# Patient Record
Sex: Female | Born: 1965 | Race: Black or African American | Hispanic: No | Marital: Single | Smoking: Current every day smoker
Health system: Southern US, Community
[De-identification: ages and names within clinical notes are randomized; demographics above are authoritative.]

## PROBLEM LIST (undated history)

## (undated) DIAGNOSIS — I5022 Chronic systolic (congestive) heart failure: Secondary | ICD-10-CM

## (undated) DIAGNOSIS — I428 Other cardiomyopathies: Secondary | ICD-10-CM

## (undated) DIAGNOSIS — Z720 Tobacco use: Secondary | ICD-10-CM

## (undated) DIAGNOSIS — I10 Essential (primary) hypertension: Secondary | ICD-10-CM

## (undated) DIAGNOSIS — I509 Heart failure, unspecified: Secondary | ICD-10-CM

## (undated) HISTORY — DX: Essential (primary) hypertension: I10

## (undated) HISTORY — DX: Heart failure, unspecified: I50.9

---

## 1998-08-17 HISTORY — PX: TUBAL LIGATION: SHX77

## 2011-07-29 ENCOUNTER — Ambulatory Visit (INDEPENDENT_AMBULATORY_CARE_PROVIDER_SITE_OTHER): Payer: 59

## 2011-07-29 DIAGNOSIS — R05 Cough: Secondary | ICD-10-CM

## 2011-07-29 DIAGNOSIS — R112 Nausea with vomiting, unspecified: Secondary | ICD-10-CM

## 2011-07-29 DIAGNOSIS — I10 Essential (primary) hypertension: Secondary | ICD-10-CM

## 2011-09-21 ENCOUNTER — Ambulatory Visit (INDEPENDENT_AMBULATORY_CARE_PROVIDER_SITE_OTHER): Payer: 59 | Admitting: Family Medicine

## 2011-09-21 ENCOUNTER — Ambulatory Visit: Payer: 59 | Admitting: Radiology

## 2011-09-21 VITALS — BP 152/88 | HR 79 | Temp 98.4°F | Resp 16 | Ht 61.25 in | Wt 155.2 lb

## 2011-09-21 DIAGNOSIS — I10 Essential (primary) hypertension: Secondary | ICD-10-CM | POA: Insufficient documentation

## 2011-09-21 DIAGNOSIS — M545 Low back pain: Secondary | ICD-10-CM

## 2011-09-21 DIAGNOSIS — M546 Pain in thoracic spine: Secondary | ICD-10-CM

## 2011-09-21 DIAGNOSIS — M549 Dorsalgia, unspecified: Secondary | ICD-10-CM

## 2011-09-21 DIAGNOSIS — R202 Paresthesia of skin: Secondary | ICD-10-CM

## 2011-09-21 DIAGNOSIS — M25529 Pain in unspecified elbow: Secondary | ICD-10-CM

## 2011-09-21 DIAGNOSIS — R209 Unspecified disturbances of skin sensation: Secondary | ICD-10-CM

## 2011-09-21 DIAGNOSIS — R200 Anesthesia of skin: Secondary | ICD-10-CM

## 2011-09-21 MED ORDER — MELOXICAM 7.5 MG PO TABS: 7.5000 mg | ORAL_TABLET | Freq: Two times a day (BID) | ORAL | Status: AC

## 2011-09-21 NOTE — Progress Notes (Signed)
Patient Name: Kim Underwood Date of Birth: 1965-11-25 Medical Record Number: [id number] Gender: female Date of Encounter: 09/21/2011  History of Present Illness:  Kim Underwood is a 46 y.o. very pleasant female patient who presents with the following:  Here with complaint of left pinky finger and left thumb numbness and also pain- had been present off and on for 2 weeks and then more constant over the last 3 days.   Has notes some difficulty with using her left hand- dropped a frying pan 2 nights ago.  Symptoms can vary without relation to time of day.  Also has pain up the ulnar side of her left arm.  No other numbness or weakness noted.  However does note lower back pain that can shoot up her back over the last 10 days.  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  Right handed.   There is no problem list on file for this patient.  No past medical history on file.  I have reviewed the patient's medical history in detail and updated the computerized patient record so it should appear there now.   No past surgical history on file. History  Substance Use Topics  . Smoking status: Current Everyday Smoker -- 0.5 packs/day    Types: Cigarettes  . Smokeless tobacco: Not on file  . Alcohol Use: Not on file   No family history on file. No Known Allergies  Medication list has been reviewed and updated.  Review of Systems As per HPI.  Kim Underwood does note that she was afraid "she was having a stroke" when this first started so she was afraid to come in and waited for the symptoms to go away. No unusual HA.  Physical Examination: Filed Vitals:   09/21/11 1009  BP: 152/88  Pulse: 79  Temp: 98.4 F (36.9 C)  TempSrc: Oral  Resp: 16  Height: 5' 1.25" (1.556 m)  Weight: 155 lb 3.2 oz (70.398 kg)    Body mass index is 29.09 kg/(m^2).  GEN: WDWN, NAD, Non-toxic, A & O x 3 HEENT: Atraumatic, Normocephalic. Neck supple. No masses, No  LAD. Ears and Nose: No external deformity. CV: RRR, No M/G/R. No extra heart sounds. PULM: CTA B, no wheezes, crackles, rhonchi. No retractions. No resp. distress. No accessory muscle use. ABD: S, NT, ND. EXTR: No c/c/e.  Tenderness at left wrist with flexion or extension especially along the ulnar aspect.  No swelling or redness  NEURO Normal gait.  Patient describes decreased sensation but also pain at her left pinky and thumb.  This is made worse by flexing or extending the wrist.  Strength is 4/5 but this is symetrical at all extremities.  Otherwise sensation is normal.  Normal biceps and petellar DTR bilateally Back: indicates tenderness at her lumbar spine with palpation, flexion and extension.  No swelling or redness or skin lesions.  Legs with normal sensation bilaterally, - straight leg raise. Gait is normal, normal cerebellar function.  PSYCH: Normally interactive. Conversant. Not depressed or anxious appearing.  Calm demeanor.   L spine 5v shows mild degenerative change, C spine 5v negative, left wrist negative  Assessment and Plan: 1. Numbness and tingling in hands  DG Cervical Spine Complete, DG Wrist Complete Left  2. Hypertension    3. Back pain of thoracolumbar region  DG Lumbar Spine Complete  Continue hypertension meds.  Called in Mobic to use prn for pain in back and wrist.  Advised her that  Mobic could increase her blood pressure so try to stick to just one pill if possible.   Likely nerve entrapment vs overuse causing left hand and wrist symptoms. Thumb spica splint for left wrist.  Offered an orthopedics referral but she prefers to wait and see how her wrist does over the next few days.  Note for work done.  Counseled patient re: signs of a stroke including painless numbness, facial drooping or weakness or difficulty speaking, and advised her that in case of these symptoms she should seek care right away, not wait for them to resolve on her own.  She will let me know if she is  not better in a few days- sooner if worse.

## 2011-09-21 NOTE — Patient Instructions (Signed)
Wear wrist splint and use Mobic as needed.  Patient (or parent if minor) instructed to return to clinic or call if not better in 2 day(s).

## 2011-09-23 ENCOUNTER — Telehealth: Payer: Self-pay | Admitting: Family Medicine

## 2011-09-24 ENCOUNTER — Telehealth: Payer: Self-pay | Admitting: Family Medicine

## 2011-09-24 NOTE — Telephone Encounter (Signed)
Created this encounter in error.   ?

## 2012-03-21 NOTE — Progress Notes (Signed)
This encounter was created in error - please disregard.

## 2012-10-07 ENCOUNTER — Other Ambulatory Visit: Payer: Self-pay | Admitting: Family Medicine

## 2012-12-12 ENCOUNTER — Ambulatory Visit (INDEPENDENT_AMBULATORY_CARE_PROVIDER_SITE_OTHER): Payer: BC Managed Care – PPO | Admitting: Family Medicine

## 2012-12-12 ENCOUNTER — Ambulatory Visit: Payer: BC Managed Care – PPO

## 2012-12-12 VITALS — BP 162/98 | HR 90 | Temp 98.0°F | Resp 16 | Ht 62.0 in | Wt 153.0 lb

## 2012-12-12 DIAGNOSIS — I10 Essential (primary) hypertension: Secondary | ICD-10-CM

## 2012-12-12 DIAGNOSIS — R0601 Orthopnea: Secondary | ICD-10-CM

## 2012-12-12 DIAGNOSIS — R05 Cough: Secondary | ICD-10-CM

## 2012-12-12 DIAGNOSIS — E876 Hypokalemia: Secondary | ICD-10-CM

## 2012-12-12 DIAGNOSIS — R059 Cough, unspecified: Secondary | ICD-10-CM

## 2012-12-12 LAB — POCT CBC
Hemoglobin: 13.1 g/dL (ref 12.2–16.2)
MPV: 6.9 fL (ref 0–99.8)
POC Granulocyte: 7.6 — AB (ref 2–6.9)
POC MID %: 8 %M (ref 0–12)
RBC: 4.5 M/uL (ref 4.04–5.48)

## 2012-12-12 MED ORDER — LISINOPRIL-HYDROCHLOROTHIAZIDE 10-12.5 MG PO TABS: 1.0000 | ORAL_TABLET | Freq: Every day | ORAL | Status: DC

## 2012-12-12 MED ORDER — CEFDINIR 300 MG PO CAPS: 300.0000 mg | ORAL_CAPSULE | Freq: Two times a day (BID) | ORAL | Status: DC

## 2012-12-12 NOTE — Patient Instructions (Signed)
Keep working on cutting down/ quitting smoking.  If you are having a hard time sleeping I would try benadryl or unisom first- these medications can be helpful but are not as strong as rx like ambien or lunesta.    I will let you know what the radiologist says about your chest x-ray.

## 2012-12-12 NOTE — Progress Notes (Addendum)
[facility] [address], [place] [address] [phone]  Date:  12/12/2012   Name:  Kim Underwood   DOB:  20-Aug-1965   MRN:  [id number]  PCP:  No primary provider on file.    Chief Complaint: Hypertension and Menopause   History of Present Illness:  Erick Oxendine is a 47 y.o. very pleasant female patient who presents with the following:  She would like to get back on her BP medication.  She has not used it in about one year- had let her care lapse due to financial difficulty.  She is not having any symptoms such as HA, but knows she needs to get back on her medications.  Her menses are spacing out- she had a cycle in October and then last month.  Her mother went through menopause at age 55.   She had most recently been on plain lisinopril 40 mg- she was changed from lisinopril/ hctz due to minimally low K.  However, she feelt better on the lisinopril/ hctz and would like to try this again if possible.    She notes that she sleeps poorly at night which she attributes to a cough and phlegm.  She has not tried any medication for this so far. She has not noted any fevers but has had some sweats- thinks this is due to menopause.  She is having some hot flashes.  This has been going on for nearly one year.  She has these every day.  However, she is able to tolerate her symptoms and does not wish to go on HRT She has had a BTL but not a hysterectomy.    Her BP has been running 140s/ 90- 100s when she checks at home   She is a current smoker- has been smoking for about 22 years. She is currently down to about 1/4 pack, but had smoked 1.5 PPD at her worst  She has been propping up on an extra pillow to sleep for about 6 weeks.  She does not have any pedal edema or SOB.   Patient Active Problem List   Diagnosis Date Noted  . Hypertension 09/21/2011    No past medical history on file.  Past Surgical History  Procedure Laterality Date  . Tubal ligation  2000     History  Substance Use Topics  . Smoking status: Current Every Day Smoker -- 0.50 packs/day    Types: Cigarettes  . Smokeless tobacco: Never Used  . Alcohol Use: Yes     Comment: very rarely    Family History  Problem Relation Age of Onset  . Stroke Mother   . Heart disease Mother   . Heart disease Father     No Known Allergies  Medication list has been reviewed and updated.  Current Outpatient Prescriptions on File Prior to Visit  Medication Sig Dispense Refill  . lisinopril (PRINIVIL,ZESTRIL) 40 MG tablet Take 40 mg by mouth daily.       No current facility-administered medications on file prior to visit.    Review of Systems:  As per HPI- otherwise negative.   Physical Examination: Filed Vitals:   12/12/12 1255  BP: 162/98  Pulse: 90  Temp: 98 F (36.7 C)  Resp: 16   Filed Vitals:   12/12/12 1255  Height: 5\' 2"  (1.575 m)  Weight: 153 lb (69.4 kg)   Body mass index is 27.98 kg/(m^2). Ideal Body Weight: Weight in (lb) to have BMI = 25: 136.4  GEN:  WDWN, NAD, Non-toxic, A & O x 3, overweight HEENT: Atraumatic, Normocephalic. Neck supple. No masses, No LAD. Ears and Nose: No external deformity. CV: RRR, No M/G/R. No JVD. No thrill. No extra heart sounds. PULM: CTA B, no wheezes, crackles, rhonchi. No retractions. No resp. distress. No accessory muscle use. EXTR: No c/c/e NEURO Normal gait.  PSYCH: Normally interactive. Conversant. Not depressed or anxious appearing.  Calm demeanor.   [facility] reading (PRIMARY) by  Dr. Patsy Lager. CXR: increased markings right lung.  Possible infiltrate.  Cardiomegaly CHEST - 2 VIEW  Comparison: None.  Findings: The cardiac silhouette is enlarged consistent with cardiomegaly and/or pericardial fluid. There is fluid in the fissures. There is interstitial and early alveolar edema. The findings are most consistent with congestive heart failure. The possibility of basilar pneumonia does exist. No significant  bony finding.  IMPRESSION: Enlarged cardiac silhouette consistent with cardiomegaly and/or pericardial fluid. Trace pleural fluid and interstitial and alveolar pulmonary density most consistent with congestive heart failure. Non consolidative infectious pneumonia at the lung bases is not excluded radiographically.   Results for orders placed in visit on 12/12/12  POCT CBC      Result Value Range   WBC 11.5 (*) 4.6 - 10.2 K/uL   Lymph, poc 3.0  0.6 - 3.4   POC LYMPH PERCENT 26.3  10 - 50 %L   MID (cbc) 0.9  0 - 0.9   POC MID % 8.0  0 - 12 %M   POC Granulocyte 7.6 (*) 2 - 6.9   Granulocyte percent 65.7  37 - 80 %G   RBC 4.50  4.04 - 5.48 M/uL   Hemoglobin 13.1  12.2 - 16.2 g/dL   HCT, POC 16.1  09.6 - 47.9 %   MCV 93.1  80 - 97 fL   MCH, POC 29.1  27 - 31.2 pg   MCHC 31.3 (*) 31.8 - 35.4 g/dL   RDW, POC 04.5     Platelet Count, POC 367  142 - 424 K/uL   MPV 6.9  0 - 99.8 fL    Assessment and Plan: HTN (hypertension) - Plan: POCT CBC, Comprehensive metabolic panel, lisinopril-hydrochlorothiazide (PRINZIDE,ZESTORETIC) 10-12.5 MG per tablet  Cough - Plan: DG Chest 2 View, cefdinir (OMNICEF) 300 MG capsule  Orthopnea - Plan: Brain natriuretic peptide  Kada is here today to restart her BP medication.  However, she also noted a cough for several weeks.  Her CXR is concerning for possible pneumonia and/ or CHF.  BNP is pending.  Electrolytes are pending.  She is not in any distress and her O2 sat is 100%.  Await BNP which should be back any time, and will treat accordingly.    Signed Abbe Amsterdam, MD  12/13/12 addned: called her to discuss likely CHF and her low potassium.  She works 2nd shift and generally goes to work between 2 and 4 pm.  Called in rx for plain lisinopril 10 mg as her K is already low. Will use this instead of the combo lisinopril/ hctz at least for now. May need to start Lasix, but would like to know more about her CHF status first.  Also K replacement 10  meq BID.  Order an echo for today.  Close follow- up planned.

## 2012-12-13 ENCOUNTER — Telehealth: Payer: Self-pay | Admitting: Radiology

## 2012-12-13 LAB — COMPREHENSIVE METABOLIC PANEL
ALT: 56 U/L — ABNORMAL HIGH (ref 0–35)
AST: 25 U/L (ref 0–37)
Albumin: 4 g/dL (ref 3.5–5.2)
Alkaline Phosphatase: 97 U/L (ref 39–117)
BUN: 11 mg/dL (ref 6–23)
CO2: 27 mEq/L (ref 19–32)
Calcium: 9.2 mg/dL (ref 8.4–10.5)
Chloride: 105 mEq/L (ref 96–112)
Creat: 0.72 mg/dL (ref 0.50–1.10)
Glucose, Bld: 91 mg/dL (ref 70–99)
Potassium: 3.1 mEq/L — ABNORMAL LOW (ref 3.5–5.3)
Sodium: 141 mEq/L (ref 135–145)
Total Bilirubin: 0.9 mg/dL (ref 0.3–1.2)
Total Protein: 7 g/dL (ref 6.0–8.3)

## 2012-12-13 LAB — BRAIN NATRIURETIC PEPTIDE: Brain Natriuretic Peptide: 313.9 pg/mL — ABNORMAL HIGH (ref 0.0–100.0)

## 2012-12-13 MED ORDER — POTASSIUM CHLORIDE CRYS ER 10 MEQ PO TBCR: 10.0000 meq | EXTENDED_RELEASE_TABLET | Freq: Two times a day (BID) | ORAL | Status: DC

## 2012-12-13 MED ORDER — LISINOPRIL 10 MG PO TABS: 10.0000 mg | ORAL_TABLET | Freq: Every day | ORAL | Status: DC

## 2012-12-13 NOTE — Telephone Encounter (Signed)
Message from Dr Patsy Lager

## 2012-12-13 NOTE — Telephone Encounter (Signed)
Can we please try to get an echo either today or tomorrow for her? She may have CHF and need to get more information. Thanks!

## 2012-12-13 NOTE — Telephone Encounter (Signed)
Kim Underwood is getting precert on this, so hopefully we can get today. I will let you know if we run into any snags.

## 2012-12-14 ENCOUNTER — Other Ambulatory Visit: Payer: Self-pay

## 2012-12-14 ENCOUNTER — Telehealth: Payer: Self-pay | Admitting: Radiology

## 2012-12-14 ENCOUNTER — Ambulatory Visit (HOSPITAL_COMMUNITY): Payer: BC Managed Care – PPO | Attending: Cardiovascular Disease | Admitting: Radiology

## 2012-12-14 ENCOUNTER — Ambulatory Visit (INDEPENDENT_AMBULATORY_CARE_PROVIDER_SITE_OTHER): Payer: BC Managed Care – PPO | Admitting: Family Medicine

## 2012-12-14 VITALS — BP 150/90

## 2012-12-14 DIAGNOSIS — R9389 Abnormal findings on diagnostic imaging of other specified body structures: Secondary | ICD-10-CM | POA: Insufficient documentation

## 2012-12-14 DIAGNOSIS — E876 Hypokalemia: Secondary | ICD-10-CM

## 2012-12-14 DIAGNOSIS — I10 Essential (primary) hypertension: Secondary | ICD-10-CM | POA: Insufficient documentation

## 2012-12-14 DIAGNOSIS — R0601 Orthopnea: Secondary | ICD-10-CM | POA: Insufficient documentation

## 2012-12-14 DIAGNOSIS — R079 Chest pain, unspecified: Secondary | ICD-10-CM

## 2012-12-14 DIAGNOSIS — I509 Heart failure, unspecified: Secondary | ICD-10-CM

## 2012-12-14 DIAGNOSIS — F17200 Nicotine dependence, unspecified, uncomplicated: Secondary | ICD-10-CM | POA: Insufficient documentation

## 2012-12-14 DIAGNOSIS — I5043 Acute on chronic combined systolic (congestive) and diastolic (congestive) heart failure: Secondary | ICD-10-CM | POA: Insufficient documentation

## 2012-12-14 MED ORDER — CARVEDILOL 3.125 MG PO TABS: 3.1250 mg | ORAL_TABLET | Freq: Two times a day (BID) | ORAL | Status: DC

## 2012-12-14 NOTE — Telephone Encounter (Signed)
Called Kim Underwood. To see if they can do this. They can not. They will have her come here after the Echo and have EKG done here. To you FYI

## 2012-12-14 NOTE — Progress Notes (Signed)
Echocardiogram performed.  

## 2012-12-14 NOTE — Progress Notes (Signed)
Here today just for an EKG.  She had an echo earlier today as we are looking into possible CHF.  She notes that her breathing feels "100% better" since starting the abx (and also restarting lisinopril)  2 days ago.  She has no peripheral edema and is no longer needed to sleep on a 2nd pillow.    Await her echo report and will give her a call.    EKG: sinus rhythm, downgoing T waves in V4/5/6.  No ST elevation or depression  Received echo report: Study Conclusions  - Left ventricle: The cavity size was mildly dilated. Wall thickness was normal. Systolic function was severely reduced. The estimated ejection fraction was in the range of 15% to 20%. Diffuse hypokinesis. Doppler parameters are consistent with an irreversible restrictive pattern, indicative of decreased left ventricular diastolic compliance and/or increased left atrial pressure (grade 4 diastolic dysfunction). - Mitral valve: Moderate regurgitation. - Left atrium: The atrium was mildly dilated. - Pulmonary arteries: PA peak pressure: 35mm Hg (S). - Pericardium, extracardiac: A trivial pericardial effusion was identified posterior to the heart.  Spoke with Dr. Graciela Husbands- we will plan to have her seen asap. In the meantime will start coreg 3.125 BID.  Continue to replete her K.  Will place an order for a K level and have her come for a blood draw only on Friday.  Spoke with patient and alerted her to plan.  Referral to cardiology asap.

## 2012-12-14 NOTE — Telephone Encounter (Signed)
Patient scheduled for Echo today at 10:30, need EKG also. Will call and see if this can be done at same time.

## 2012-12-15 ENCOUNTER — Telehealth: Payer: Self-pay | Admitting: Family Medicine

## 2012-12-15 DIAGNOSIS — I509 Heart failure, unspecified: Secondary | ICD-10-CM

## 2012-12-15 NOTE — Telephone Encounter (Signed)
Called Kim Underwood- I was able to get an appt with cardiology next Wednesday 12/21/12 at 11:15.  She is aware of appt. She will also come in tomorrow for a K recheck/ urine HCG and I will check her BP.

## 2012-12-16 ENCOUNTER — Other Ambulatory Visit (INDEPENDENT_AMBULATORY_CARE_PROVIDER_SITE_OTHER): Payer: BC Managed Care – PPO | Admitting: Family Medicine

## 2012-12-16 DIAGNOSIS — I509 Heart failure, unspecified: Secondary | ICD-10-CM

## 2012-12-16 DIAGNOSIS — E876 Hypokalemia: Secondary | ICD-10-CM

## 2012-12-16 LAB — POTASSIUM: Potassium: 4.2 mEq/L (ref 3.5–5.3)

## 2012-12-16 NOTE — Progress Notes (Addendum)
Patient here for lab only She is feeling well, maybe feels a little bit tired since she started the coreg.  Breathing well  Understands that she is seeing cardiology next week.   Will follow- up with K results later today  Results for orders placed in visit on 12/16/12  POTASSIUM      Result Value Range   Potassium 4.2  3.5 - 5.3 mEq/L  POCT URINE PREGNANCY      Result Value Range   Preg Test, Ur Negative     Received her K level- now normal.  Her lisinopril may also be helping to correct her K.  At this time she can d/c the K supplement.  She will ask the cardiologist if they can recheck her K at her visit next week.  If they are not able to do this she will let me know and I will order it.

## 2012-12-19 ENCOUNTER — Encounter: Payer: Self-pay | Admitting: Family Medicine

## 2012-12-19 NOTE — Addendum Note (Signed)
Addended by: Abbe Amsterdam C on: 12/19/2012 09:05 PM   Modules accepted: Orders

## 2012-12-21 ENCOUNTER — Encounter: Payer: Self-pay | Admitting: Cardiovascular Disease

## 2012-12-21 ENCOUNTER — Ambulatory Visit (INDEPENDENT_AMBULATORY_CARE_PROVIDER_SITE_OTHER): Payer: BC Managed Care – PPO | Admitting: Cardiovascular Disease

## 2012-12-21 VITALS — BP 130/94 | HR 82 | Ht 60.0 in | Wt 157.0 lb

## 2012-12-21 DIAGNOSIS — I5022 Chronic systolic (congestive) heart failure: Secondary | ICD-10-CM

## 2012-12-21 MED ORDER — SPIRONOLACTONE 25 MG PO TABS: 12.5000 mg | ORAL_TABLET | Freq: Every day | ORAL | Status: DC

## 2012-12-21 NOTE — Progress Notes (Signed)
HPI:  47 year-old woman presenting for initial cardiac evaluation. She has recently been diagnosed with a severe cardiomyopathy after presenting with symptoms of orthopnea and PND. BNP and chest XRay were consistent with CHF. An echo showed severe LV systolic and diastolic dysfunction with an LVEF of less than 20%.   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 She is able to do physical work without breathlessness. She complains of feeling 'slowed down' since being started on carvedilol. Her nocturnal cough has resolved. She denies any further orthopnea, but admits to PND on occasion. No chest pain, pressure, palpitations, lightheadedness, or syncope.  She has no past history of cardiac disease. She was diagnosed with HTN several years ago but has not been consistently treated because of limited resources. She has not been hospitalized, has no history of CAD or MI, and does not drink alcohol regularly. She's has no recent illness or systemic symptoms. Specifically denies joint pains, myalgias, fever, chills, weight loss. She does admit to 'hot flashes' but attributes this to menopause.   Outpatient Encounter Prescriptions as of 12/21/2012  Medication Sig Dispense Refill  . carvedilol (COREG) 3.125 MG tablet Take 1 tablet (3.125 mg total) by mouth 2 (two) times daily with a meal.  60 tablet  3  . cefdinir (OMNICEF) 300 MG capsule Take 1 capsule (300 mg total) by mouth 2 (two) times daily.  20 capsule  0  . lisinopril (PRINIVIL,ZESTRIL) 10 MG tablet Take 1 tablet (10 mg total) by mouth daily.  30 tablet  3  . spironolactone (ALDACTONE) 25 MG tablet Take 0.5 tablets (12.5 mg total) by mouth daily.  90 tablet  3  . [DISCONTINUED] potassium chloride (K-DUR,KLOR-CON) 10 MEQ tablet Take 1 tablet (10 mEq total) by mouth 2 (two) times daily.  60 tablet  1   No facility-administered encounter medications on file as of 12/21/2012.    Review of patient's allergies indicates no known  allergies.  Past Medical History  Diagnosis Date  . Hypertension, malignant   . Acute on chronic combined systolic and diastolic heart failure     Past Surgical History  Procedure Laterality Date  . Tubal ligation  2000    History   Social History  . Marital Status: Single    Spouse Name: N/A    Number of Children: N/A  . Years of Education: N/A   Occupational History  . Not on file.   Social History Main Topics  . Smoking status: Current Every Day Smoker -- 0.50 packs/day    Types: Cigarettes  . Smokeless tobacco: Never Used  . Alcohol Use: Yes     Comment: very rarely  . Drug Use: No  . Sexually Active: Not on file   Other Topics Concern  . Not on file   Social History Narrative   Lives in [place]. Smokes cigarettes 1/2 ppd, social Etoh but not to excess. Works at [employer].     Family History  Problem Relation Age of Onset  . Stroke Mother   . Heart disease Mother   . Heart disease Father    Family history: Father had CHF in his 19's, Mother had CAD and stents in her 74's  ROS:  General: no fevers/chills/night sweats Eyes: no blurry vision, diplopia, or amaurosis ENT: no sore throat or hearing loss Resp: see HPI CV: see HPI GI: no abdominal pain, nausea, vomiting, diarrhea, or constipation GU: no dysuria, frequency, or hematuria Skin: no rash Neuro: no  headache, numbness, tingling, or weakness of extremities Musculoskeletal: no joint pain or swelling Heme: no bleeding, DVT, or easy bruising Endo: no polydipsia or polyuria  BP 130/94  Pulse 82  Ht 5' (1.524 m)  Wt 71.215 kg (157 lb)  BMI 30.66 kg/m2  LMP 11/16/2012  PHYSICAL EXAM: Pt is alert and oriented, WD, WN, in no distress, pleasant African-American woman. HEENT: normal Neck: JVP normal. Carotid upstrokes normal without bruits. No thyromegaly. Lungs: equal expansion, clear bilaterally CV: Apex is discrete and nondisplaced, RRR without murmur or gallop Abd: soft, NT,  +BS, no bruit, no hepatosplenomegaly, obese Back: no CVA tenderness Ext: no C/C/E, thin        DP/PT pulses intact and = Skin: warm and dry without rash Neuro: CNII-XII intact             Strength intact = bilaterally  EKG 12/14/12: Sinus rhythm with biatrial enlargement, nonspecific ST-T wave abnormality  CXR 12/12/12: Findings: The cardiac silhouette is enlarged consistent with  cardiomegaly and/or pericardial fluid. There is fluid in the  fissures. There is interstitial and early alveolar edema. The  findings are most consistent with congestive heart failure. The  possibility of basilar pneumonia does exist. No significant bony  finding.  IMPRESSION:  Enlarged cardiac silhouette consistent with cardiomegaly and/or  pericardial fluid. Trace pleural fluid and interstitial and  alveolar pulmonary density most consistent with congestive heart  failure. Non consolidative infectious pneumonia at the lung bases  is not excluded radiographically.   2D Echo (12/14/12): Left ventricle: The cavity size was mildly dilated. Wall thickness was normal. Systolic function was severely reduced. The estimated ejection fraction was in the range of 15% to 20%. Diffuse hypokinesis. Doppler parameters are consistent with an irreversible restrictive pattern, indicative of decreased left ventricular diastolic compliance and/or increased left atrial pressure (grade 4 diastolic dysfunction).  ------------------------------------------------------------ Aortic valve: Structurally normal valve. Trileaflet. Cusp separation was normal. Doppler: Transvalvular velocity was within the normal range. There was no stenosis. No regurgitation.  ------------------------------------------------------------ Aorta: The aorta was normal, not dilated, and non-diseased.  ------------------------------------------------------------ Mitral valve: Structurally normal valve. Leaflet separation was normal. Doppler:  Transvalvular velocity was within the normal range. There was no evidence for stenosis. Moderate regurgitation. Peak gradient: 5mm Hg (D).  ------------------------------------------------------------ Left atrium: The atrium was mildly dilated.  ------------------------------------------------------------ Right ventricle: The cavity size was normal. Wall thickness was normal. Systolic function was normal.  ------------------------------------------------------------ Pulmonic valve: Structurally normal valve. Cusp separation was normal. Doppler: Transvalvular velocity was within the normal range. Trivial regurgitation.  ------------------------------------------------------------ Tricuspid valve: Structurally normal valve. Leaflet separation was normal. Doppler: Transvalvular velocity was within the normal range. Mild regurgitation.  ------------------------------------------------------------ Pulmonary artery: The main pulmonary artery was dilated. Systolic pressure was at the upper limits of normal.  ------------------------------------------------------------ Right atrium: The atrium was at the upper limits of normal in size.  ------------------------------------------------------------ Pericardium: A trivial pericardial effusion was identified posterior to the heart.  ASSESSMENT AND PLAN: 1. 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 Despite limited symptoms, her echo suggests advanced disease. We discussed implications of CHF diagnosis and issues related to medical therapy, diet, and close follow-up. We reviewed potential etiologies of severe LV dysfunction, including hypertension, CAD, infiltrative disorders. I have ordered a cardiac MRI with and without gadolinium contrast to further evaluate potential etiologies. Regarding medical therapy, we have added aldactone 12.5 mg daily. She wants to stop carvedilol but she was advised to stick with it at low dose and hopefully her fatigue will  improve. She understands  the importance of a beta-blocker in the setting of CHF with severe LV dysfunction. I think she will need a right and left heart catheterization and we discussed this today. I am going to refer her to the [facility] and will defer heart catheterization to Dr Gala Romney.  2. HTN, malignant with suboptimal control. Continue carvedilol and lisinopril. Add aldactone. Follow-up BMET in 2 weeks.  Echo, labwork, and XRay studies were reviewed. Will review cardiac MRI when available and anticipate heart catheterization as above.  Tonny Bollman 12/21/2012 2:14 PM

## 2012-12-21 NOTE — Patient Instructions (Addendum)
Your physician has recommended you make the following change in your medication: START Aldactone 25mg  take one-half tablet by mouth daily  Your physician has requested that you have a cardiac MRI with and without contrast. Cardiac MRI uses a computer to create images of your heart as its beating, producing both still and moving pictures of your heart and major blood vessels. For further information please visit [email or web address]. Please follow the instruction sheet given to you today for more information.  Your physician recommends that you return for lab work in: 2 WEEKS (BMP)  Your physician recommends that you schedule a follow-up appointment in: 3-4 WEEKS with CHF clinic (appointment should be after the pt has had cardiac MRI performed)

## 2012-12-28 ENCOUNTER — Telehealth: Payer: Self-pay

## 2012-12-28 ENCOUNTER — Telehealth: Payer: Self-pay | Admitting: Cardiovascular Disease

## 2012-12-28 NOTE — Telephone Encounter (Signed)
This was ordered through [facility].

## 2012-12-28 NOTE — Telephone Encounter (Signed)
New Prob      Pt has questions regarding cardiac MRI, would like to speak to nurse.

## 2012-12-28 NOTE — Telephone Encounter (Signed)
Left pt a message to call back. 

## 2012-12-28 NOTE — Telephone Encounter (Signed)
Dr.Copland, Pt would like to speak with you regarding her MRI not being scheduled yet. Best# [phone]

## 2012-12-28 NOTE — Telephone Encounter (Signed)
Called [facility] regarding her MRI.  The triage pool will address

## 2012-12-29 NOTE — Telephone Encounter (Signed)
Pt needs to be scheduled for Cardiac MRI  Order in EPIC.  Pt is waiting for a call.

## 2013-01-02 ENCOUNTER — Other Ambulatory Visit (INDEPENDENT_AMBULATORY_CARE_PROVIDER_SITE_OTHER): Payer: BC Managed Care – PPO

## 2013-01-02 ENCOUNTER — Other Ambulatory Visit: Payer: Self-pay | Admitting: *Deleted

## 2013-01-02 DIAGNOSIS — E876 Hypokalemia: Secondary | ICD-10-CM

## 2013-01-02 DIAGNOSIS — I5022 Chronic systolic (congestive) heart failure: Secondary | ICD-10-CM

## 2013-01-02 LAB — BASIC METABOLIC PANEL
BUN: 10 mg/dL (ref 6–23)
Calcium: 8.5 mg/dL (ref 8.4–10.5)
GFR: 99.56 mL/min (ref >60.00)
Glucose, Bld: 114 mg/dL — ABNORMAL HIGH (ref 70–99)
Sodium: 139 mEq/L (ref 135–145)

## 2013-01-10 ENCOUNTER — Telehealth: Payer: Self-pay | Admitting: *Deleted

## 2013-01-10 ENCOUNTER — Other Ambulatory Visit (INDEPENDENT_AMBULATORY_CARE_PROVIDER_SITE_OTHER): Payer: BC Managed Care – PPO

## 2013-01-10 DIAGNOSIS — I5043 Acute on chronic combined systolic (congestive) and diastolic (congestive) heart failure: Secondary | ICD-10-CM

## 2013-01-10 DIAGNOSIS — E876 Hypokalemia: Secondary | ICD-10-CM

## 2013-01-10 LAB — BASIC METABOLIC PANEL
CO2: 24 mEq/L (ref 19–32)
Calcium: 8.6 mg/dL (ref 8.4–10.5)
Chloride: 105 mEq/L (ref 96–112)
Sodium: 137 mEq/L (ref 135–145)

## 2013-01-10 MED ORDER — POTASSIUM CHLORIDE ER 10 MEQ PO TBCR: 20.0000 meq | EXTENDED_RELEASE_TABLET | Freq: Two times a day (BID) | ORAL | Status: DC

## 2013-01-10 NOTE — Telephone Encounter (Signed)
Message copied by Tarri Fuller on Tue Jan 10, 2013  5:42 PM ------      Message from: [place], [place] T      Created: Tue Jan 10, 2013  5:34 PM       Add K+ 20 mEq QD      Check BMET in 1 week      Tereso Newcomer, PA-C        01/10/2013 5:33 PM ------

## 2013-01-10 NOTE — Telephone Encounter (Signed)
pt notified about lab results and to start K+; pt had been on K+ 10 bid recently but states PCP d/c. pt understands per recommendations from PA to restart K+ 20 meq bid, bmet 01/16/13, pt said ok and thank you.

## 2013-01-10 NOTE — Progress Notes (Signed)
Quick Note:  Preliminary report reviewed by triage nurse and sent to MD desk. ______ 

## 2013-01-16 ENCOUNTER — Institutional Professional Consult (permissible substitution): Payer: BC Managed Care – PPO | Admitting: Cardiology

## 2013-01-16 ENCOUNTER — Other Ambulatory Visit (INDEPENDENT_AMBULATORY_CARE_PROVIDER_SITE_OTHER): Payer: BC Managed Care – PPO

## 2013-01-16 DIAGNOSIS — I5043 Acute on chronic combined systolic (congestive) and diastolic (congestive) heart failure: Secondary | ICD-10-CM

## 2013-01-16 LAB — BASIC METABOLIC PANEL
BUN: 12 mg/dL (ref 6–23)
Chloride: 109 mEq/L (ref 96–112)
Potassium: 3.8 mEq/L (ref 3.5–5.1)
Sodium: 138 mEq/L (ref 135–145)

## 2013-01-17 ENCOUNTER — Telehealth: Payer: Self-pay | Admitting: *Deleted

## 2013-01-17 NOTE — Telephone Encounter (Signed)
pt notified about lab results with verbal understanding today.  

## 2013-01-17 NOTE — Telephone Encounter (Signed)
Message copied by Tarri Fuller on Tue Jan 17, 2013 10:00 AM ------      Message from: [place], [place] T      Created: Mon Jan 16, 2013  9:03 PM       Potassium and kidney function look good.      Continue with current treatment plan.      Tereso Newcomer, PA-C  4:49 PM 06/30/2012 ------

## 2013-01-30 ENCOUNTER — Encounter (HOSPITAL_COMMUNITY): Payer: BC Managed Care – PPO

## 2013-02-09 ENCOUNTER — Ambulatory Visit (HOSPITAL_COMMUNITY): Payer: BC Managed Care – PPO

## 2013-02-13 ENCOUNTER — Encounter: Payer: Self-pay | Admitting: Cardiology

## 2013-02-13 ENCOUNTER — Ambulatory Visit (HOSPITAL_COMMUNITY)
Admission: RE | Admit: 2013-02-13 | Discharge: 2013-02-13 | Disposition: A | Discharge status: Home / Self Care | Payer: BC Managed Care – PPO | Source: Ambulatory Visit | Attending: Internal Medicine | Admitting: Internal Medicine

## 2013-02-13 ENCOUNTER — Encounter (HOSPITAL_COMMUNITY): Payer: Self-pay | Admitting: *Deleted

## 2013-02-13 ENCOUNTER — Encounter (HOSPITAL_COMMUNITY): Payer: Self-pay

## 2013-02-13 ENCOUNTER — Ambulatory Visit (INDEPENDENT_AMBULATORY_CARE_PROVIDER_SITE_OTHER): Payer: BC Managed Care – PPO | Admitting: Cardiology

## 2013-02-13 ENCOUNTER — Other Ambulatory Visit (HOSPITAL_COMMUNITY): Payer: Self-pay | Admitting: *Deleted

## 2013-02-13 VITALS — BP 120/82 | HR 80 | Wt 148.0 lb

## 2013-02-13 DIAGNOSIS — I10 Essential (primary) hypertension: Secondary | ICD-10-CM

## 2013-02-13 DIAGNOSIS — I5041 Acute combined systolic (congestive) and diastolic (congestive) heart failure: Secondary | ICD-10-CM | POA: Insufficient documentation

## 2013-02-13 DIAGNOSIS — I5043 Acute on chronic combined systolic (congestive) and diastolic (congestive) heart failure: Secondary | ICD-10-CM

## 2013-02-13 DIAGNOSIS — I5022 Chronic systolic (congestive) heart failure: Secondary | ICD-10-CM | POA: Insufficient documentation

## 2013-02-13 DIAGNOSIS — I509 Heart failure, unspecified: Secondary | ICD-10-CM

## 2013-02-13 LAB — BASIC METABOLIC PANEL
CO2: 23 mEq/L (ref 19–32)
Calcium: 9 mg/dL (ref 8.4–10.5)
Chloride: 104 mEq/L (ref 96–112)
Glucose, Bld: 99 mg/dL (ref 70–99)
Sodium: 138 mEq/L (ref 135–145)

## 2013-02-13 LAB — TSH: TSH: 0.144 u[IU]/mL — ABNORMAL LOW (ref 0.350–4.500)

## 2013-02-13 MED ORDER — LISINOPRIL 10 MG PO TABS: 20.0000 mg | ORAL_TABLET | Freq: Every day | ORAL | Status: DC

## 2013-02-13 MED ORDER — CARVEDILOL 3.125 MG PO TABS: 3.1250 mg | ORAL_TABLET | Freq: Two times a day (BID) | ORAL | Status: DC

## 2013-02-13 MED ORDER — LISINOPRIL 10 MG PO TABS: 10.0000 mg | ORAL_TABLET | Freq: Two times a day (BID) | ORAL | Status: DC

## 2013-02-13 MED ORDER — SPIRONOLACTONE 25 MG PO TABS: 12.5000 mg | ORAL_TABLET | Freq: Every day | ORAL | Status: DC

## 2013-02-13 MED ORDER — POTASSIUM CHLORIDE ER 10 MEQ PO TBCR: 20.0000 meq | EXTENDED_RELEASE_TABLET | Freq: Two times a day (BID) | ORAL | Status: DC

## 2013-02-13 NOTE — Patient Instructions (Addendum)
Your physician has requested that you have a cardiac catheterization. Cardiac catheterization is used to diagnose and/or treat various heart conditions. Doctors may recommend this procedure for a number of different reasons. The most common reason is to evaluate chest pain. Chest pain can be a symptom of coronary artery disease (CAD), and cardiac catheterization can show whether plaque is narrowing or blocking your heart's arteries. This procedure is also used to evaluate the valves, as well as measure the blood flow and oxygen levels in different parts of your heart. For further information please visit [email or web address] Please follow instruction sheet, as given.  Your physician recommends that you schedule a follow-up appointment in: 1 month

## 2013-02-13 NOTE — Progress Notes (Signed)
Patient ID: Kim Underwood, female DOB: 09/08/1965, 47 y.o. MRN: [id number]  Weight Range    Baseline proBNP     PCP: Dr Copland   HPI:  She is referred to the HF clinic by Dr Cooper due to new diagnosis of HF.   Kim Underwood is a 47 year with a history of severe cardiomyopathy, HTN, and current smoker. She stopped taking her BP medications a year ago because she did not have insurance. She has not history of coronary disease. Never had a heart cath. Both her parents had heart failure. Mom had stents.   ECHO 12/14/12 EF 15-20% grade IV diastolic dysfunction.   Initially evaluated by Dr Cooper 12/21/12 . Plan for cardiac MRI but this was denied by insurance. She was started on spironolactone 12.5 mg daily.   She returns for follow up. Chronic cough. Denies SOB/PND/Orthopnea. No chest pain. Compliant with medications. 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   ROS: All systems negative except as listed in HPI, PMH and Problem List.   Past Medical History   Diagnosis  Date   .  Hypertension, malignant    .  Acute on chronic combined systolic and diastolic heart failure     Current Outpatient Prescriptions   Medication  Sig  Dispense  Refill   .  carvedilol (COREG) 3.125 MG tablet  Take 1 tablet (3.125 mg total) by mouth 2 (two) times daily with a meal.  60 tablet  3   .  lisinopril (PRINIVIL,ZESTRIL) 10 MG tablet  Take 1 tablet (10 mg total) by mouth daily.  30 tablet  3   .  potassium chloride (K-DUR) 10 MEQ tablet  Take 2 tablets (20 mEq total) by mouth 2 (two) times daily.     .  spironolactone (ALDACTONE) 25 MG tablet  Take 0.5 tablets (12.5 mg total) by mouth daily.  90 tablet  3    No current facility-administered medications for this encounter.    PHYSICAL EXAM:  Filed Vitals:    02/13/13 1203   BP:  120/82   Pulse:  80   Weight:  148 lb (67.132 kg)   SpO2:  100%   General: Well appearing. No resp difficulty  HEENT: normal  Neck: supple. JVP flat. Carotids 2+ bilaterally;  no bruits. No lymphadenopathy or thryomegaly appreciated.  Cor: PMI normal. Regular rate & rhythm. No rubs, gallops or murmurs.  Lungs: clear  Abdomen: soft, nontender, nondistended. No hepatosplenomegaly. No bruits or masses. Good bowel sounds.  Extremities: no cyanosis, clubbing, rash, edema  Neuro: alert & orientedx3, cranial nerves grossly intact. Moves all 4 extremities w/o difficulty. Affect pleasant.   ASSESSMENT & PLAN: 1. HTN: BP controlled on current regimen.  2. Smoking: Patient strongly encouraged to stop smoking.  3. Chronic systolic CHF: EF 15%, diffuse hypokinesis on echo.  Cause of cardiomyopathy uncertain.  Will need to rule out CAD.  Will also need to workup for other secondary causes of cardiomyopathy.  Of note, both parents had CHF, but appears that mother also had CAD.  QRS is not wide on ECG.  No findings suggestive of sarcoidosis on CXR.  - Will plan RHC/LHC to assess CI, filling pressures, and to look for CAD.  - Send labs for TSH, BNP, Fe studies, serum/urine immunofixation, ANA.  - Will try again to get approval for cMRI if no CAD on cath.  - continue current Coreg and spironolactone. - Increase lisinopril to 20 mg daily with BMET in 1 week.     Joeseph Underwood 02/13/2013 1:00 PM  

## 2013-02-14 ENCOUNTER — Other Ambulatory Visit (HOSPITAL_COMMUNITY): Payer: Self-pay | Admitting: Adult Health

## 2013-02-14 DIAGNOSIS — I5022 Chronic systolic (congestive) heart failure: Secondary | ICD-10-CM

## 2013-02-15 ENCOUNTER — Ambulatory Visit (HOSPITAL_COMMUNITY): Payer: BC Managed Care – PPO

## 2013-02-15 LAB — PROTEIN ELECTROPHORESIS, SERUM
Albumin ELP: 56.1 % (ref 55.8–66.1)
Alpha-1-Globulin: 4.2 % (ref 2.9–4.9)
Alpha-2-Globulin: 11 % (ref 7.1–11.8)
Beta Globulin: 7 % (ref 4.7–7.2)
M-Spike, %: NOT DETECTED g/dL
Total Protein ELP: 6.9 g/dL (ref 6.0–8.3)

## 2013-02-28 ENCOUNTER — Ambulatory Visit (INDEPENDENT_AMBULATORY_CARE_PROVIDER_SITE_OTHER): Payer: BC Managed Care – PPO | Admitting: *Deleted

## 2013-02-28 DIAGNOSIS — I10 Essential (primary) hypertension: Secondary | ICD-10-CM

## 2013-02-28 LAB — HEPATIC FUNCTION PANEL
ALT: 17 U/L (ref 0–35)
AST: 16 U/L (ref 0–37)
Alkaline Phosphatase: 78 U/L (ref 39–117)
Bilirubin, Direct: 0.1 mg/dL (ref 0.0–0.3)
Total Bilirubin: 1.1 mg/dL (ref 0.3–1.2)

## 2013-03-03 ENCOUNTER — Inpatient Hospital Stay (HOSPITAL_BASED_OUTPATIENT_CLINIC_OR_DEPARTMENT_OTHER)
Admission: RE | Admit: 2013-03-03 | Discharge: 2013-03-03 | Disposition: A | Discharge status: Home / Self Care | Payer: BC Managed Care – PPO | Source: Ambulatory Visit | Attending: Cardiology | Admitting: Cardiology

## 2013-03-03 ENCOUNTER — Encounter (HOSPITAL_BASED_OUTPATIENT_CLINIC_OR_DEPARTMENT_OTHER)
Admission: RE | Disposition: A | Discharge status: Home / Self Care | Payer: Self-pay | Source: Ambulatory Visit | Attending: Cardiology

## 2013-03-03 ENCOUNTER — Ambulatory Visit (HOSPITAL_COMMUNITY)
Admission: RE | Admit: 2013-03-03 | Discharge: 2013-03-03 | Disposition: A | Discharge status: Home / Self Care | Payer: BC Managed Care – PPO | Source: Ambulatory Visit | Attending: Cardiology | Admitting: Cardiology

## 2013-03-03 DIAGNOSIS — I428 Other cardiomyopathies: Secondary | ICD-10-CM

## 2013-03-03 DIAGNOSIS — I509 Heart failure, unspecified: Secondary | ICD-10-CM | POA: Insufficient documentation

## 2013-03-03 DIAGNOSIS — F17200 Nicotine dependence, unspecified, uncomplicated: Secondary | ICD-10-CM | POA: Insufficient documentation

## 2013-03-03 DIAGNOSIS — I5022 Chronic systolic (congestive) heart failure: Secondary | ICD-10-CM

## 2013-03-03 DIAGNOSIS — I5043 Acute on chronic combined systolic (congestive) and diastolic (congestive) heart failure: Secondary | ICD-10-CM | POA: Insufficient documentation

## 2013-03-03 DIAGNOSIS — I10 Essential (primary) hypertension: Secondary | ICD-10-CM | POA: Insufficient documentation

## 2013-03-03 LAB — POCT I-STAT 3, ART BLOOD GAS (G3+)
Bicarbonate: 24.1 mEq/L — ABNORMAL HIGH (ref 20.0–24.0)
O2 Saturation: 92 %
TCO2: 25 mmol/L (ref 0–100)

## 2013-03-03 LAB — POCT I-STAT 3, VENOUS BLOOD GAS (G3P V)
Acid-base deficit: 2 mmol/L (ref 0.0–2.0)
TCO2: 25 mmol/L (ref 0–100)
pH, Ven: 7.322 — ABNORMAL HIGH (ref 7.250–7.300)

## 2013-03-03 LAB — CBC
MCH: 30.3 pg (ref 26.0–34.0)
MCHC: 34.6 g/dL (ref 30.0–36.0)
MCV: 87.5 fL (ref 78.0–100.0)
Platelets: 276 10*3/uL (ref 150–400)
RBC: 4.65 MIL/uL (ref 3.87–5.11)

## 2013-03-03 LAB — BASIC METABOLIC PANEL
BUN: 15 mg/dL (ref 6–23)
CO2: 24 mEq/L (ref 19–32)
Calcium: 9.4 mg/dL (ref 8.4–10.5)
Glucose, Bld: 139 mg/dL — ABNORMAL HIGH (ref 70–99)
Sodium: 139 mEq/L (ref 135–145)

## 2013-03-03 SURGERY — JV LEFT AND RIGHT HEART CATHETERIZATION WITH CORONARY ANGIOGRAM
Anesthesia: Moderate Sedation

## 2013-03-03 MED ORDER — SODIUM CHLORIDE 0.9 % IV SOLN: INTRAVENOUS | Status: DC

## 2013-03-03 MED ORDER — ACETAMINOPHEN 325 MG PO TABS: 650.0000 mg | ORAL_TABLET | ORAL | Status: DC | PRN

## 2013-03-03 MED ORDER — SODIUM CHLORIDE 0.9 % IJ SOLN: 3.0000 mL | Freq: Two times a day (BID) | INTRAMUSCULAR | Status: DC

## 2013-03-03 MED ORDER — DIAZEPAM 2 MG PO TABS
2.0000 mg | ORAL_TABLET | ORAL | Status: AC
  Administered 2013-03-03: 2 mg via ORAL

## 2013-03-03 MED ORDER — SODIUM CHLORIDE 0.9 % IJ SOLN: 3.0000 mL | INTRAMUSCULAR | Status: DC | PRN

## 2013-03-03 MED ORDER — ASPIRIN 81 MG PO CHEW
324.0000 mg | CHEWABLE_TABLET | ORAL | Status: AC
  Administered 2013-03-03: 324 mg via ORAL

## 2013-03-03 MED ORDER — ONDANSETRON HCL 4 MG/2ML IJ SOLN: 4.0000 mg | Freq: Four times a day (QID) | INTRAMUSCULAR | Status: DC | PRN

## 2013-03-03 MED ORDER — SODIUM CHLORIDE 0.9 % IV SOLN: 250.0000 mL | INTRAVENOUS | Status: DC | PRN

## 2013-03-03 MED ORDER — SODIUM CHLORIDE 0.9 % IV SOLN
INTRAVENOUS | Status: DC
  Administered 2013-03-03: 10:00:00 via INTRAVENOUS

## 2013-03-03 NOTE — Progress Notes (Signed)
Pt in for heart cath and needs labs, labs ordered stat

## 2013-03-03 NOTE — CV Procedure (Signed)
   Cardiac Catheterization Procedure Note  Name: Kim Underwood MRN: [id number] DOB: 07/26/1966  Procedure: Right Heart Cath, Left Heart Cath, Selective Coronary Angiography, LV angiography  Indication: Cardiomyopathy of uncertain etiology.   Procedural Details: The right groin was prepped, draped, and anesthetized with 1% lidocaine. Using the modified Seldinger technique a 5 French sheath was placed in the right femoral artery and a 7 French sheath was placed in the right femoral vein. A Swan-Ganz catheter was used for the right heart catheterization. Standard protocol was followed for recording of right heart pressures and sampling of oxygen saturations. Fick cardiac output was calculated. Standard Judkins catheters were used for selective coronary angiography and left ventriculography. There were no immediate procedural complications. The patient was transferred to the post catheterization recovery area for further monitoring.  Procedural Findings: Hemodynamics (mmHg) RA mean 3 RV 35/7 PA 32/11, mean 19 PCWP mean 6 LV 123/21 AO 124/79  Oxygen saturations: PA 69% AO 92%  Cardiac Output (Fick) 4.6  Cardiac Index (Fick) 2.8   Coronary angiography: Coronary dominance: co-dominant  Left mainstem: No significant disease.   Left anterior descending (LAD): Luminal irregularities.   Left circumflex (LCx): Luminal irregularities, co-dominant.   Right coronary artery (RCA): Luminal irregularities, relatively small vessel co-dominant with LCx.   Left ventriculography: Left ventricular systolic function is normal, LVEF is estimated at 25% with global hypokinesis, there is no significant mitral regurgitation   Final Conclusions:  Nonischemic cardiomyopathy.  EF remains low.  Filling pressures optimized.  Will increase Coreg to 6.25 mg bid.  Followup in office in 2 wks.   Marca Ancona 03/03/2013, 11:09 AM

## 2013-03-03 NOTE — Interval H&P Note (Signed)
History and Physical Interval Note:  03/03/2013 10:38 AM  Kim Underwood  has presented today for surgery, with the diagnosis of chest pain  The various methods of treatment have been discussed with the patient and family. After consideration of risks, benefits and other options for treatment, the patient has consented to  Procedure(s): JV LEFT AND RIGHT HEART CATHETERIZATION WITH CORONARY ANGIOGRAM (N/A) as a surgical intervention .  The patient's history has been reviewed, patient examined, no change in status, stable for surgery.  I have reviewed the patient's chart and labs.  Questions were answered to the patient's satisfaction.     Mylo Choi [employer]

## 2013-03-03 NOTE — Progress Notes (Signed)
Bedrest begins @ 1115, tegaderm dressing applied, site level 0.

## 2013-03-03 NOTE — H&P (View-Only) (Signed)
Patient ID: Kim Underwood, female DOB: [birth year], 47 y.o. MRN: [id number]  Weight Range    Baseline proBNP     PCP: Dr Patsy Lager   HPI:  She is referred to the HF clinic by Dr Excell Seltzer due to new diagnosis of HF.   Kim Underwood is a 47 year with a history of severe cardiomyopathy, HTN, and current smoker. She stopped taking her BP medications a year ago because she did not have insurance. She has not history of coronary disease. Never had a heart cath. Both her parents had heart failure. Mom had stents.   ECHO 12/14/12 EF 15-20% grade IV diastolic dysfunction.   Initially evaluated by Dr Excell Seltzer 12/21/12 . Plan for cardiac MRI but this was denied by insurance. She was started on spironolactone 12.5 mg daily.   She returns for follow up. Chronic cough. Denies SOB/PND/Orthopnea. No chest pain. Compliant with medications. Works at the [employer] in [employer].   ROS: All systems negative except as listed in HPI, PMH and Problem List.   Past Medical History   Diagnosis  Date   .  Hypertension, malignant    .  Acute on chronic combined systolic and diastolic heart failure     Current Outpatient Prescriptions   Medication  Sig  Dispense  Refill   .  carvedilol (COREG) 3.125 MG tablet  Take 1 tablet (3.125 mg total) by mouth 2 (two) times daily with a meal.  60 tablet  3   .  lisinopril (PRINIVIL,ZESTRIL) 10 MG tablet  Take 1 tablet (10 mg total) by mouth daily.  30 tablet  3   .  potassium chloride (K-DUR) 10 MEQ tablet  Take 2 tablets (20 mEq total) by mouth 2 (two) times daily.     Marland Kitchen  spironolactone (ALDACTONE) 25 MG tablet  Take 0.5 tablets (12.5 mg total) by mouth daily.  90 tablet  3    No current facility-administered medications for this encounter.    PHYSICAL EXAM:  Filed Vitals:    02/13/13 1203   BP:  120/82   Pulse:  80   Weight:  148 lb (67.132 kg)   SpO2:  100%   General: Well appearing. No resp difficulty  HEENT: normal  Neck: supple. JVP flat. Carotids 2+ bilaterally;  no bruits. No lymphadenopathy or thryomegaly appreciated.  Cor: PMI normal. Regular rate & rhythm. No rubs, gallops or murmurs.  Lungs: clear  Abdomen: soft, nontender, nondistended. No hepatosplenomegaly. No bruits or masses. Good bowel sounds.  Extremities: no cyanosis, clubbing, rash, edema  Neuro: alert & orientedx3, cranial nerves grossly intact. Moves all 4 extremities w/o difficulty. Affect pleasant.   ASSESSMENT & PLAN: 1. HTN: BP controlled on current regimen.  2. Smoking: Patient strongly encouraged to stop smoking.  3. Chronic systolic CHF: EF 49%, diffuse hypokinesis on echo.  Cause of cardiomyopathy uncertain.  Will need to rule out CAD.  Will also need to workup for other secondary causes of cardiomyopathy.  Of note, both parents had CHF, but appears that mother also had CAD.  QRS is not wide on ECG.  No findings suggestive of sarcoidosis on CXR.  - Will plan RHC/LHC to assess CI, filling pressures, and to look for CAD.  - Send labs for TSH, BNP, Fe studies, serum/urine immunofixation, ANA.  - Will try again to get approval for cMRI if no CAD on cath.  - continue current Coreg and spironolactone. - Increase lisinopril to 20 mg daily with BMET in 1 week.  Marca Ancona 02/13/2013 1:00 PM

## 2013-03-16 ENCOUNTER — Ambulatory Visit (HOSPITAL_COMMUNITY)
Admission: RE | Admit: 2013-03-16 | Discharge: 2013-03-16 | Disposition: A | Discharge status: Home / Self Care | Payer: BC Managed Care – PPO | Source: Ambulatory Visit | Attending: Internal Medicine | Admitting: Internal Medicine

## 2013-03-16 ENCOUNTER — Encounter (HOSPITAL_COMMUNITY): Payer: Self-pay

## 2013-03-16 VITALS — BP 138/100 | HR 96 | Wt 147.1 lb

## 2013-03-16 DIAGNOSIS — I10 Essential (primary) hypertension: Secondary | ICD-10-CM

## 2013-03-16 DIAGNOSIS — I509 Heart failure, unspecified: Secondary | ICD-10-CM | POA: Insufficient documentation

## 2013-03-16 DIAGNOSIS — I5022 Chronic systolic (congestive) heart failure: Secondary | ICD-10-CM

## 2013-03-16 DIAGNOSIS — F17200 Nicotine dependence, unspecified, uncomplicated: Secondary | ICD-10-CM | POA: Insufficient documentation

## 2013-03-16 MED ORDER — CARVEDILOL 6.25 MG PO TABS: 9.3750 mg | ORAL_TABLET | Freq: Two times a day (BID) | ORAL | Status: DC

## 2013-03-16 NOTE — Patient Instructions (Addendum)
Take carvedilol 9.375 twice a day  Do the following things EVERYDAY: 1) Weigh yourself in the morning before breakfast. Write it down and keep it in a log. 2) Take your medicines as prescribed 3) Eat low salt foods-Limit salt (sodium) to 2000 mg per day.  4) Stay as active as you can everyday 5) Limit all fluids for the day to less than 2 liters  Follow up in a month

## 2013-03-16 NOTE — Progress Notes (Signed)
Patient ID: Kim Underwood, female   DOB: 08/12/1966, 47 y.o.   MRN: [id number]  Weight Range  145-147 pounds  Baseline proBNP     PCP: Dr Patsy Lager   HPI:  Kim Underwood is a 47 year with a history of severe cardiomyopathy, HTN, and current smoker. She stopped taking her BP medications a year ago because she did not have insurance. She has not history of coronary disease. Never had a heart cath. Both her parents had heart failure. Mom had stents.   ECHO 12/14/12 EF 15-20% grade IV diastolic dysfunction.   Initially evaluated by Dr Excell Seltzer 12/21/12 . Plan for cardiac MRI but this was denied by insurance. She was started on spironolactone 12.5 mg daily.   03/03/13 RHC /LHC RA mean 3  RV 35/7  PA 32/11, mean 19  PCWP mean 6  LV 123/21  AO 124/79  Oxygen saturations:  PA 69%  AO 92%  Cardiac Output (Fick) 4.6  Cardiac Index (Fick) 2. Coronaries ok   02/13/13  Creatinine 0.82 Potassium 3.7 SPEP not detected  02/13/13 HIV non reactive   She returns for follow up. Last visit lisinopril was increased to 20 mg daily.  Follow up labs ok. Denies SOB/PND/Orthopnea. She does admit to dyspnea with inclined surfaces. She weighs twice a week at home 145-147 pounds.  Compliant with medications. Laid off from her job at [employer] in [employer].   ROS: All systems negative except as listed in HPI, PMH and Problem List.   Past Medical History   Diagnosis  Date   .  Hypertension, malignant    .  Acute on chronic combined systolic and diastolic heart failure     Current outpatient prescriptions:carvedilol (COREG) 3.125 MG tablet, Take 6.25 mg by mouth 2 (two) times daily with a meal., Disp: , Rfl: ;  lisinopril (PRINIVIL,ZESTRIL) 10 MG tablet, Take 2 tablets (20 mg total) by mouth daily., Disp: 60 tablet, Rfl: 6;  potassium chloride (K-DUR) 10 MEQ tablet, Take 2 tablets (20 mEq total) by mouth 2 (two) times daily., Disp: 120 tablet, Rfl: 6 spironolactone (ALDACTONE) 25 MG tablet, Take 0.5 tablets  (12.5 mg total) by mouth daily., Disp: 15 tablet, Rfl: 6   No current facility-administered medications for this encounter.    PHYSICAL EXAM:  Filed Vitals:    02/13/13 1203   BP:  120/82   Pulse:  80   Weight:  148 lb (67.132 kg)   SpO2:  100%   General: Well appearing. No resp difficulty  HEENT: normal  Neck: supple. JVP flat. Carotids 2+ bilaterally; no bruits. No lymphadenopathy or thryomegaly appreciated.  Cor: PMI normal. Regular rate & rhythm. No rubs, gallops or murmurs.  Lungs: clear  Abdomen: soft, nontender, nondistended. No hepatosplenomegaly. No bruits or masses. Good bowel sounds.  Extremities: no cyanosis, clubbing, rash, edema  Neuro: alert & orientedx3, cranial nerves grossly intact. Moves all 4 extremities w/o difficulty. Affect pleasant.   ASSESSMENT & PLAN: 1.  Chronic systolic CHF:12/14/2012  EF 15%, diffuse hypokinesis on echo.  Cause of cardiomyopathy uncertain.  03/03/13 LHC- coronaries ok RHC - NICM Filling pressures ok.  Volume status stable.  Continue lisinopril 20 mg daily Increase carvedilol to 9.375 mg twice a day Continue spironolactone 25 mg daily.  Unable to obtain cardiac MRI . Denied by insurance.  Will need repeat ECHO after HF meds optimized.   2. HTN: BP elelvated but she did not take her medications this am.    3. Smoking: Continue to  encourage smoking cessation.      CLEGG,AMY 03/16/2013 10:57 AM

## 2013-03-19 ENCOUNTER — Other Ambulatory Visit: Payer: Self-pay | Admitting: Family Medicine

## 2013-04-20 ENCOUNTER — Encounter (HOSPITAL_COMMUNITY): Payer: BC Managed Care – PPO

## 2013-05-01 ENCOUNTER — Telehealth: Payer: Self-pay

## 2013-05-01 NOTE — Telephone Encounter (Signed)
Patient states she is having lower back pain. States the last time she experienced pain like this it was pneumonia. Patient would like a prescription called in to her pharmacy [facility] on [address]. Suggested to patient that she should come in for an OV. [phone].

## 2013-05-02 NOTE — Telephone Encounter (Signed)
Called he we can not treat this by phone, she needs office visit.

## 2013-05-18 ENCOUNTER — Ambulatory Visit (HOSPITAL_COMMUNITY)
Admission: RE | Admit: 2013-05-18 | Discharge: 2013-05-18 | Disposition: A | Discharge status: Home / Self Care | Payer: BC Managed Care – PPO | Source: Ambulatory Visit | Attending: Internal Medicine | Admitting: Internal Medicine

## 2013-05-18 VITALS — BP 152/92 | HR 67 | Wt 147.0 lb

## 2013-05-18 DIAGNOSIS — I5022 Chronic systolic (congestive) heart failure: Secondary | ICD-10-CM

## 2013-05-18 DIAGNOSIS — I509 Heart failure, unspecified: Secondary | ICD-10-CM

## 2013-05-18 DIAGNOSIS — I10 Essential (primary) hypertension: Secondary | ICD-10-CM

## 2013-05-18 DIAGNOSIS — F17200 Nicotine dependence, unspecified, uncomplicated: Secondary | ICD-10-CM

## 2013-05-18 LAB — BASIC METABOLIC PANEL
BUN: 12 mg/dL (ref 6–23)
Calcium: 9 mg/dL (ref 8.4–10.5)
Creatinine, Ser: 0.59 mg/dL (ref 0.50–1.10)
GFR calc non Af Amer: >90 mL/min (ref >90)
Glucose, Bld: 126 mg/dL — ABNORMAL HIGH (ref 70–99)
Sodium: 136 mEq/L (ref 135–145)

## 2013-05-18 MED ORDER — HYDRALAZINE HCL 25 MG PO TABS: 25.0000 mg | ORAL_TABLET | Freq: Two times a day (BID) | ORAL | Status: DC

## 2013-05-18 MED ORDER — ISOSORBIDE MONONITRATE ER 30 MG PO TB24: 15.0000 mg | ORAL_TABLET | Freq: Every day | ORAL | Status: DC

## 2013-05-18 NOTE — Progress Notes (Signed)
Patient ID: Kim Underwood, female   DOB: [birth year], 47 y.o.   MRN: [id number]   Weight Range  145-147 pounds  Baseline proBNP     PCP: Dr Patsy Lager   HPI:  Kim Underwood is a 47 year with a history of severe cardiomyopathy, HTN, and current smoker. She stopped taking her BP medications a year ago because she did not have insurance.   ECHO 12/14/12 EF 15-20% grade IV diastolic dysfunction.   Initially evaluated by Dr Excell Seltzer 12/21/12 . Plan for cardiac MRI but this was denied by insurance. She was started on spironolactone 12.5 mg daily.   03/03/13 RHC /LHC RA mean 3  RV 35/7  PA 32/11, mean 19  PCWP mean 6  LV 123/21  AO 124/79  Oxygen saturations:  PA 69%  AO 92%  Cardiac Output (Fick) 4.6  Cardiac Index (Fick) 2. Coronaries ok   02/13/13  Creatinine 0.82 Potassium 3.7 SPEP not detected  02/13/13 HIV non reactive  03/03/13 Creatinine 0.82 K 3.7   She returns for follow up. Last visit carvedilol increased to 9.375 mg twice a day. Weight at home 144-146 pounds. SBP at home 130s. Complains of lower back pain.  Denies SOB/PND/Orthopnea. Denies SOB with inclines surfaces.  Sleeping on 1 pillow. Compliant with medications. Smoking 8 cigarettes a day. Working full time [employer]   SH: Lives alone. Current smoker   FH: Both her parents had heart failure. Mom had stents.      ROS: All systems negative except as listed in HPI, PMH and Problem List.   Past Medical History   Diagnosis  Date   .  Hypertension, malignant    .  Acute on chronic combined systolic and diastolic heart failure     Current outpatient prescriptions:carvedilol (COREG) 6.25 MG tablet, Take 1.5 tablets (9.375 mg total) by mouth 2 (two) times daily with a meal., Disp: 90 tablet, Rfl: 3;  lisinopril (PRINIVIL,ZESTRIL) 10 MG tablet, Take 2 tablets (20 mg total) by mouth daily., Disp: 60 tablet, Rfl: 6;  potassium chloride (K-DUR,KLOR-CON) 10 MEQ tablet, TAKE 1 TABLET BY MOUTH TWICE DAILY, Disp: 60 tablet, Rfl:  1 spironolactone (ALDACTONE) 25 MG tablet, Take 0.5 tablets (12.5 mg total) by mouth daily., Disp: 15 tablet, Rfl: 6   PHYSICAL EXAM:  Blood pressure 152/92, pulse 67, weight 147 lb (66.679 kg), SpO2 100.00%.  General: Well appearing. No resp difficulty  HEENT: normal  Neck: supple. JVP flat. Carotids 2+ bilaterally; no bruits. No lymphadenopathy or thryomegaly appreciated.  Cor: PMI normal. Regular rate & rhythm. No rubs, gallops or murmurs.  Lungs: clear  Abdomen: soft, nontender, nondistended. No hepatosplenomegaly. No bruits or masses. Good bowel sounds.  Extremities: no cyanosis, clubbing, rash, edema  Neuro: alert & orientedx3, cranial nerves grossly intact. Moves all 4 extremities w/o difficulty. Affect pleasant.   ASSESSMENT & PLAN: 1.  Chronic systolic CHF:12/14/2012  EF 15%, diffuse hypokinesis on echo.  Cause of cardiomyopathy uncertain.  03/03/13 LHC- coronaries ok RHC - NICM Filling pressures ok.  Volume status stable. Continue spironolactone 25 mg daily.   Continue lisinopril 20 mg dail Continue  carvedilol to 9.375 mg twice a day Add hydralazine 25 mg BID (only twice a day because she would not be able to take afternoon medications) and Imdur 15 mg daily Continue spironolactone 25 mg daily.  Unable to obtain cardiac MRI . Denied by insurance.  Repeat ECHO at next visit Check BMET   2. HTN: BP elelvated. As above added hydralzine 25 mg  bid.     3. Smoking: Continue to encourage smoking cessation.   Follow up in 3-4 weeks with an ECHO and Dr Gala Romney   Kim Underwood 05/18/2013 9:10 AM

## 2013-05-18 NOTE — Patient Instructions (Addendum)
Follow up 3-4 weeks with an ECHO  Take hydralazine 25 mg twice a day  Take Imdur 15 mg daily   Do the following things EVERYDAY: 1) Weigh yourself in the morning before breakfast. Write it down and keep it in a log. 2) Take your medicines as prescribed 3) Eat low salt foods-Limit salt (sodium) to 2000 mg per day.  4) Stay as active as you can everyday 5) Limit all fluids for the day to less than 2 liters

## 2013-05-26 ENCOUNTER — Other Ambulatory Visit: Payer: Self-pay | Admitting: Family Medicine

## 2013-06-08 ENCOUNTER — Ambulatory Visit (HOSPITAL_COMMUNITY)
Admission: RE | Admit: 2013-06-08 | Discharge: 2013-06-08 | Disposition: A | Discharge status: Home / Self Care | Payer: BC Managed Care – PPO | Source: Ambulatory Visit | Attending: Internal Medicine | Admitting: Internal Medicine

## 2013-06-08 ENCOUNTER — Ambulatory Visit (HOSPITAL_BASED_OUTPATIENT_CLINIC_OR_DEPARTMENT_OTHER)
Admission: RE | Admit: 2013-06-08 | Discharge: 2013-06-08 | Disposition: A | Discharge status: Home / Self Care | Payer: BC Managed Care – PPO | Source: Ambulatory Visit | Attending: Internal Medicine | Admitting: Internal Medicine

## 2013-06-08 ENCOUNTER — Encounter (HOSPITAL_COMMUNITY): Payer: Self-pay

## 2013-06-08 VITALS — BP 150/90 | HR 60 | Wt 147.7 lb

## 2013-06-08 DIAGNOSIS — I5022 Chronic systolic (congestive) heart failure: Secondary | ICD-10-CM

## 2013-06-08 DIAGNOSIS — I509 Heart failure, unspecified: Secondary | ICD-10-CM | POA: Insufficient documentation

## 2013-06-08 DIAGNOSIS — F17200 Nicotine dependence, unspecified, uncomplicated: Secondary | ICD-10-CM | POA: Insufficient documentation

## 2013-06-08 DIAGNOSIS — I10 Essential (primary) hypertension: Secondary | ICD-10-CM

## 2013-06-08 DIAGNOSIS — I059 Rheumatic mitral valve disease, unspecified: Secondary | ICD-10-CM

## 2013-06-08 DIAGNOSIS — I079 Rheumatic tricuspid valve disease, unspecified: Secondary | ICD-10-CM | POA: Insufficient documentation

## 2013-06-08 MED ORDER — SPIRONOLACTONE 25 MG PO TABS: 25.0000 mg | ORAL_TABLET | Freq: Every day | ORAL | Status: DC

## 2013-06-08 MED ORDER — CARVEDILOL 12.5 MG PO TABS: 12.5000 mg | ORAL_TABLET | Freq: Two times a day (BID) | ORAL | Status: DC

## 2013-06-08 NOTE — Progress Notes (Signed)
Weight Range  145-147 pounds   Baseline proBNP    PCP: Dr Shela Commons. [place]   HPI:  Mrs Kim Underwood is a 45 year with a history of severe cardiomyopathy, HTN, and current smoker. She stopped taking her BP medications a year ago because she did not have insurance.   ECHO 12/14/12 EF 15-20% grade IV diastolic dysfunction.   Initially evaluated by Dr Excell Seltzer 12/21/12 . Plan for cardiac MRI but this was denied by insurance.   03/03/13 RHC /LHC  RA mean 3  RV 35/7  PA 32/11, mean 19  PCWP mean 6  LV 123/21  AO 124/79  Oxygen saturations:  PA 69%  AO 92%  Cardiac Output (Fick) 4.6  Cardiac Index (Fick) 2.  Coronaries ok   02/13/13 Creatinine 0.82 Potassium 3.7 SPEP not detected  02/13/13 HIV non reactive  03/03/13 Creatinine 0.82 K 3.7  05/18/13 K+ 3.6, creatinine 0.59  Follow up: Last visit started hydralazine 25 mg BID and IMDUR 15 mg daily. Couldn't do hydralazine TID d/t schedule she works couldn't remember afternoon dose. Weight at home 145-147 lbs. Feeling good until couple days ago developed URI. Denies SOB, orthopnea, or CP. Can go upstairs with no issues. Taking medications as prescribed, following low salt diet and drinking less than 2 L a day. 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  ECHO      06/08/13: EF 30-35% with global hypokinesis, normal RV, mild MR, grade I DD  SH: Lives alone. Current smoker   FH: Both her parents had heart failure. Mom had stents  ROS: All systems negative except as listed in HPI, PMH and Problem List.  Past Medical History  Diagnosis Date  . Hypertension, malignant   . Acute on chronic combined systolic and diastolic heart failure     Current Outpatient Prescriptions  Medication Sig Dispense Refill  . carvedilol (COREG) 6.25 MG tablet Take 1.5 tablets (9.375 mg total) by mouth 2 (two) times daily with a meal.  90 tablet  3  . hydrALAZINE (APRESOLINE) 25 MG tablet Take 1 tablet (25 mg total) by mouth 2 (two) times daily.  60 tablet  3  . ibuprofen (ADVIL,MOTRIN) 200 MG  tablet Take 200 mg by mouth every 6 (six) hours as needed for pain.      . isosorbide mononitrate (IMDUR) 30 MG 24 hr tablet Take 0.5 tablets (15 mg total) by mouth daily.  15 tablet  3  . lisinopril (PRINIVIL,ZESTRIL) 10 MG tablet Take 2 tablets (20 mg total) by mouth daily.  60 tablet  6  . potassium chloride (K-DUR,KLOR-CON) 10 MEQ tablet TAKE 1 TABLET BY MOUTH TWICE DAILY  60 tablet  5  . spironolactone (ALDACTONE) 25 MG tablet Take 0.5 tablets (12.5 mg total) by mouth daily.  15 tablet  6   No current facility-administered medications for this encounter.      Filed Vitals:   06/08/13 1028  BP: 150/90  Pulse: 60  Weight: 147 lb 11.2 oz (66.996 kg)  SpO2: 100%   PHYSICAL EXAM: General:  Well appearing. No resp difficulty HEENT: normal Neck: supple. JVP flat. Carotids 2+ bilaterally; no bruits. No lymphadenopathy or thryomegaly appreciated. Cor: PMI normal. Regular rate & rhythm. No rubs, gallops or murmurs. Lungs: clear Abdomen: soft, nontender, nondistended. No hepatosplenomegaly. No bruits or masses. Good bowel sounds. Extremities: no cyanosis, clubbing, rash, edema Neuro: alert & orientedx3, cranial nerves grossly intact. Moves all 4 extremities w/o difficulty. Affect pleasant.  ASSESSMENT & PLAN:  1. Chronic systolic HF: NICM, ? Due  to HTN.   EF 15% => 30-35% with grade I diastolic dysfunction (10/14 echo).  NYHA I-II symptoms and volume status stable. 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  - ECHO reviewed today with Dr Shirlee Latch and EF has improved to 30-35%. She remains on the border of needing ICD, however meds not at optimal goal. Will continue to titrate and repeat ECHO in 6 months. Hopeful EF will continue to improve. - WIll increase coreg to 12.5 mg BID and Spiro to 25 mg daily, check BMET next week.  - TSH in hospital 0.144, will repeat TSH, free T3 and free T4 next week. - Continue hydralazine 25 mg BID and IMDUR 15 mg  daily. Not able to take hydralazine TID d/t work schedule.  - Continue current dose of lisinopril.  - Reinforced the need and importance of daily weights, a low sodium diet, and fluid restriction (less than 2 L a day). Instructed to call the HF clinic if weight increases more than 3 lbs overnight or 5 lbs in a week.  2. HTN:  - remains elevated on current regimen. As above will increase spiro and coreg 3. Smoking:  - Doing much better and is now only smoking 3 cigarettes a day. Encouraged her to continue to try and quit and congratulated her on the progress she has made thus far.   F/U 6-8 weeks for medication titration  Ulla Potash B NP-C 12:14 PM  Patient seen with NP, agree with the above note.  EF has improved to 30-35% from 15% (nonischemic cardiomyopathy).  Hopefully with continued medical treatment EF will continue to recover.  No ICD yet => repeat echo in 6 months.  Today will increase Coreg to 12.5 mg bid and spironolactone to 25 mg daily with BMET/BNP next week.   Marca Ancona 06/08/2013

## 2013-06-08 NOTE — Progress Notes (Signed)
Echocardiogram 2D Echocardiogram has been performed.  Kim Underwood 06/08/2013, 11:16 AM

## 2013-06-08 NOTE — Patient Instructions (Signed)
Increase coreg to 12.5 mg twice a day. You can take 2 tablets twice a day until you run out and then new prescription will be 12.5 mg tablet and then take 1 tablet twice a day.  Increase Spironolactone to 25 mg daily (1 tablet)  Get labs next Thursday anytime. [address], [address] floor  Follow up 6-8 weeks.  Call any issues.  Do the following things EVERYDAY: 1) Weigh yourself in the morning before breakfast. Write it down and keep it in a log. 2) Take your medicines as prescribed 3) Eat low salt foods-Limit salt (sodium) to 2000 mg per day.  4) Stay as active as you can everyday 5) Limit all fluids for the day to less than 2 liters 6)

## 2013-06-14 ENCOUNTER — Ambulatory Visit (INDEPENDENT_AMBULATORY_CARE_PROVIDER_SITE_OTHER): Payer: BC Managed Care – PPO | Admitting: *Deleted

## 2013-06-14 DIAGNOSIS — I10 Essential (primary) hypertension: Secondary | ICD-10-CM

## 2013-06-14 DIAGNOSIS — I5043 Acute on chronic combined systolic (congestive) and diastolic (congestive) heart failure: Secondary | ICD-10-CM

## 2013-06-14 DIAGNOSIS — I5022 Chronic systolic (congestive) heart failure: Secondary | ICD-10-CM

## 2013-06-14 DIAGNOSIS — I509 Heart failure, unspecified: Secondary | ICD-10-CM

## 2013-06-14 LAB — BASIC METABOLIC PANEL
BUN: 18 mg/dL (ref 6–23)
Calcium: 9.3 mg/dL (ref 8.4–10.5)
Creatinine, Ser: 0.7 mg/dL (ref 0.4–1.2)
GFR: 110.61 mL/min (ref >60.00)
Glucose, Bld: 115 mg/dL — ABNORMAL HIGH (ref 70–99)
Potassium: 4.2 mEq/L (ref 3.5–5.1)

## 2013-06-14 LAB — TSH: TSH: 0.18 u[IU]/mL — ABNORMAL LOW (ref 0.35–5.50)

## 2013-06-27 ENCOUNTER — Telehealth: Payer: Self-pay | Admitting: Family Medicine

## 2013-06-27 DIAGNOSIS — E0590 Thyrotoxicosis, unspecified without thyrotoxic crisis or storm: Secondary | ICD-10-CM

## 2013-06-27 NOTE — Telephone Encounter (Signed)
Received notice of suppressed TSH per cardiology.  Called and discussed with Dr. Lafe Garin at [facility].  At this time treatment is not necessary, but she would like to see and evaluate pt.  Called Suellen and let her know- she is ok with having this referrral

## 2013-07-10 ENCOUNTER — Ambulatory Visit: Payer: BC Managed Care – PPO | Admitting: Internal Medicine

## 2013-07-20 ENCOUNTER — Encounter (HOSPITAL_COMMUNITY): Payer: BC Managed Care – PPO

## 2013-07-21 ENCOUNTER — Ambulatory Visit: Payer: BC Managed Care – PPO | Admitting: Internal Medicine

## 2013-07-26 ENCOUNTER — Ambulatory Visit (HOSPITAL_COMMUNITY)
Admission: RE | Admit: 2013-07-26 | Discharge: 2013-07-26 | Disposition: A | Discharge status: Home / Self Care | Payer: BC Managed Care – PPO | Source: Ambulatory Visit | Attending: Internal Medicine | Admitting: Internal Medicine

## 2013-07-26 ENCOUNTER — Encounter (HOSPITAL_COMMUNITY): Payer: Self-pay

## 2013-07-26 VITALS — BP 130/80 | HR 62 | Wt 146.0 lb

## 2013-07-26 DIAGNOSIS — I5022 Chronic systolic (congestive) heart failure: Secondary | ICD-10-CM | POA: Insufficient documentation

## 2013-07-26 DIAGNOSIS — I509 Heart failure, unspecified: Secondary | ICD-10-CM

## 2013-07-26 DIAGNOSIS — F17200 Nicotine dependence, unspecified, uncomplicated: Secondary | ICD-10-CM | POA: Insufficient documentation

## 2013-07-26 DIAGNOSIS — I10 Essential (primary) hypertension: Secondary | ICD-10-CM

## 2013-07-26 MED ORDER — LISINOPRIL 10 MG PO TABS: 30.0000 mg | ORAL_TABLET | Freq: Every day | ORAL | Status: DC

## 2013-07-26 NOTE — Progress Notes (Signed)
Patient ID: Kim Underwood, female   DOB: 1965/11/16, 47 y.o.   MRN: [id number] Weight Range  145-147 pounds   Baseline proBNP    PCP: Dr Shela Commons. [place]   HPI:  Kim Underwood is a 59 year with a history of severe cardiomyopathy, HTN, and current smoker.   ECHO 12/14/12 EF 15-20% grade IV diastolic dysfunction.   Initially evaluated by Dr Excell Seltzer 12/21/12 . Plan for cardiac MRI but this was denied by insurance.   03/03/13 RHC /LHC  RA mean 3  RV 35/7  PA 32/11, mean 19  PCWP mean 6  LV 123/21  AO 124/79  Oxygen saturations:  PA 69%  AO 92%  Cardiac Output (Fick) 4.6  Cardiac Index (Fick) 2.  Coronaries ok   02/13/13 Creatinine 0.82 Potassium 3.7 SPEP not detected  02/13/13 HIV non reactive  03/03/13 Creatinine 0.82 K 3.7  05/18/13 K+ 3.6, creatinine 0.59 06/14/13 K 4.2 Creatinine 0.7  TSH 0.18  She returns for follow up . Last visit carvedilol was increased 12.5 mg twice and spironolactone increased to 25 mg daily. Says she feels great. Denies SOB/PND/Orthopnea/dizziness. Able to walk up steps. Weight at home  145-149 pounds.  No lower extremity edema. Working full time as a Social research officer, government for [employer] She continues to smoke about 7 cigarettes per day. Not exercising.    ECHO       12/14/12 EF 15%      06/08/13: EF 30-35% with global hypokinesis, normal RV, mild MR, grade I DD  SH: Lives alone. Current smoker   FH: Both her parents had heart failure. Mom had stents  ROS: All systems negative except as listed in HPI, PMH and Problem List.  Past Medical History  Diagnosis Date  . Hypertension, malignant   . Acute on chronic combined systolic and diastolic heart failure     Current Outpatient Prescriptions  Medication Sig Dispense Refill  . carvedilol (COREG) 12.5 MG tablet Take 1 tablet (12.5 mg total) by mouth 2 (two) times daily with a meal.  90 tablet  3  . hydrALAZINE (APRESOLINE) 25 MG tablet Take 1 tablet (25 mg total) by mouth 2 (two) times daily.  60 tablet  3  . ibuprofen  (ADVIL,MOTRIN) 200 MG tablet Take 200 mg by mouth every 6 (six) hours as needed for pain.      . isosorbide mononitrate (IMDUR) 30 MG 24 hr tablet Take 0.5 tablets (15 mg total) by mouth daily.  15 tablet  3  . lisinopril (PRINIVIL,ZESTRIL) 10 MG tablet Take 2 tablets (20 mg total) by mouth daily.  60 tablet  6  . potassium chloride (K-DUR,KLOR-CON) 10 MEQ tablet TAKE 1 TABLET BY MOUTH TWICE DAILY  60 tablet  5  . spironolactone (ALDACTONE) 25 MG tablet Take 1 tablet (25 mg total) by mouth daily.  30 tablet  6   No current facility-administered medications for this encounter.      Filed Vitals:   07/26/13 1001  BP: 130/80  Pulse: 62  Weight: 146 lb (66.225 kg)  SpO2: 99%   PHYSICAL EXAM: General:  Well appearing. No resp difficulty HEENT: normal Neck: supple. JVP flat. Carotids 2+ bilaterally; no bruits. No lymphadenopathy or thryomegaly appreciated. Cor: PMI normal. Regular rate & rhythm. No rubs, gallops or murmurs. Lungs: clear Abdomen: soft, nontender, nondistended. No hepatosplenomegaly. No bruits or masses. Good bowel sounds. Extremities: no cyanosis, clubbing, rash, edema Neuro: alert & orientedx3, cranial nerves grossly intact. Moves all 4 extremities w/o difficulty. Affect pleasant.  ASSESSMENT & PLAN:  1. Chronic systolic HF: NICM, ? Due to HTN.   EF 15% => 30-35% with grade I diastolic dysfunction (10/14 echo).  As noted from last visit she remains on the border of needing ICD, however meds not at optimal goal. Repeat ECHO in April 2015  - Continue carvedilol 12.5 mg BID and Spiro to 25 mg daily, check BMET next week.  - Continue hydralazine 25 mg BID and IMDUR 15 mg daily. Will only take hydralazine twice a day.   - Increase lisinopril to 30 mg once a day (she is only able to once a day) Check BMET next week.  - Reinforced the need and importance of daily weights, a low sodium diet, and fluid restriction (less than 2 L a day). Instructed to call the HF clinic if weight  increases more than 3 lbs overnight or 5 lbs in a week.  2. HTN: Continue current dose of carvedilol, hydralazine, Cleda Daub, Imdur . Increase lisinopril to 30 mg daily.  3. Smoking: Contines to smoke about 7 cigarettes per day. Declines smoking cessation at this time. Encouraged to stop smoking.   Follow up in 6 weeks.   Gilad Dugger NP-C 10:54 AM

## 2013-07-26 NOTE — Patient Instructions (Signed)
Follow up in 6 weeks  Take lisinopril 30 mg daily  Do the following things EVERYDAY: 1) Weigh yourself in the morning before breakfast. Write it down and keep it in a log. 2) Take your medicines as prescribed 3) Eat low salt foods-Limit salt (sodium) to 2000 mg per day.  4) Stay as active as you can everyday 5) Limit all fluids for the day to less than 2 liters

## 2013-07-28 ENCOUNTER — Ambulatory Visit (INDEPENDENT_AMBULATORY_CARE_PROVIDER_SITE_OTHER): Payer: BC Managed Care – PPO | Admitting: Internal Medicine

## 2013-07-28 VITALS — BP 122/78 | HR 68 | Temp 98.6°F | Ht 62.0 in | Wt 148.0 lb

## 2013-07-28 DIAGNOSIS — E042 Nontoxic multinodular goiter: Secondary | ICD-10-CM

## 2013-07-28 DIAGNOSIS — E0590 Thyrotoxicosis, unspecified without thyrotoxic crisis or storm: Secondary | ICD-10-CM

## 2013-07-28 DIAGNOSIS — E041 Nontoxic single thyroid nodule: Secondary | ICD-10-CM

## 2013-07-28 NOTE — Patient Instructions (Signed)
We will call you with the results on the thyroid tests and will decide about further plan when these are back. Please return in 4 months for another visit, but we may need to check labs sooner.

## 2013-07-28 NOTE — Progress Notes (Addendum)
Patient ID: Kim Underwood, female   DOB: Jan 27, 1966, 47 y.o.   MRN: [id number]   HPI  Kim Underwood is a 47 y.o.-year-old female, referred by her PCP, Dr. Dallas Schimke, for evaluation for subclinical hyperthyroidism.  Pt was incidentally found to have a low TSH in 01/2013, and this was confirmed 4 mo later.   I reviewed pt's thyroid tests: Lab Results  Component Value Date   TSH 0.18* 06/14/2013   TSH 0.144* 02/13/2013   FREET4 1.07 06/14/2013    Pt denies feeling nodules in neck, hoarseness, dysphagia/odynophagia, SOB with lying down.   She c/o: - + excessive sweating/heat intolerance (hot flushes) for 1 year - has them during the day and at night - + weight loss - but has CHF and fluctuating fluid status - lost weight before 02/2013, lately stabilized - no tremors - no anxiety - no palpitations - no problems with concentration - no fatigue - no hyperdefecation  Pt has a FH of thyroid ds in MGM. No FH of thyroid cancer. No h/o radiation tx to head or neck.  No seaweed or kelp, no recent contrast studies. No steroid use. No herbal supplements.   I reviewed her chart and she also has a history of HTN, CHF - latest 2-D echo in 06/08/2013, with EF 30-35%, grade 1 diastolic dysfunction, mildly dilated left ventricle, global hypokinesis, mild MR. She is not on amiodarone.  ROS: Constitutional: see HPI, + poor sleep Eyes: no blurry vision, no xerophthalmia ENT: no sore throat, no nodules palpated in throat, no dysphagia/odynophagia, no hoarseness Cardiovascular: no CP/SOB/palpitations/leg swelling Respiratory: + cough/no SOB Gastrointestinal: no N/V/D/C Musculoskeletal: no muscle/joint aches Skin: no rashes Neurological: no tremors/numbness/tingling/dizziness Psychiatric: no depression/anxiety  Past Medical History  Diagnosis Date  . Hypertension, malignant   . Acute on chronic combined systolic and diastolic heart failure    Past Surgical History  Procedure Laterality Date  .  Tubal ligation  2000   History   Social History  . Marital Status: Single    Spouse Name: N/A    Number of Children: 1   Occupational History  . housekeeping   Social History Main Topics  . Smoking status: Current Every Day Smoker -- 0.50 packs/day    Types: Cigarettes  . Smokeless tobacco: Never Used  . Alcohol Use: Yes     Comment: very rarely  . Drug Use: No   Social History Narrative   Lives in [place]. Smokes cigarettes 1/2 ppd, social Etoh but not to excess. Works at [employer].    Current Outpatient Prescriptions on File Prior to Visit  Medication Sig Dispense Refill  . carvedilol (COREG) 12.5 MG tablet Take 1 tablet (12.5 mg total) by mouth 2 (two) times daily with a meal.  90 tablet  3  . hydrALAZINE (APRESOLINE) 25 MG tablet Take 1 tablet (25 mg total) by mouth 2 (two) times daily.  60 tablet  3  . ibuprofen (ADVIL,MOTRIN) 200 MG tablet Take 200 mg by mouth every 6 (six) hours as needed for pain.      . isosorbide mononitrate (IMDUR) 30 MG 24 hr tablet Take 0.5 tablets (15 mg total) by mouth daily.  15 tablet  3  . lisinopril (PRINIVIL,ZESTRIL) 10 MG tablet Take 3 tablets (30 mg total) by mouth daily.  90 tablet  6  . potassium chloride (K-DUR,KLOR-CON) 10 MEQ tablet TAKE 1 TABLET BY MOUTH TWICE DAILY  60 tablet  5  . spironolactone (ALDACTONE) 25 MG tablet Take 1 tablet (25  mg total) by mouth daily.  30 tablet  6   No current facility-administered medications on file prior to visit.   No Known Allergies Family History  Problem Relation Age of Onset  . Stroke Mother   . Heart disease Mother   . Heart disease Father    PE: BP 122/78  Pulse 68  Temp(Src) 98.6 F (37 C) (Oral)  Ht 5\' 2"  (1.575 m)  Wt 148 lb (67.132 kg)  BMI 27.06 kg/m2  SpO2 99%  LMP 11/16/2012 Wt Readings from Last 3 Encounters:  07/28/13 148 lb (67.132 kg)  07/26/13 146 lb (66.225 kg)  06/08/13 147 lb 11.2 oz (66.996 kg)   Constitutional: overweight, in NAD Eyes:  PERRLA, EOMI, no exophthalmos, no lid lag, no stare ENT: moist mucous membranes, no thyromegaly, no thyroid bruits, no cervical lymphadenopathy Cardiovascular: RRR, No MRG Respiratory: CTA B Gastrointestinal: abdomen soft, NT, ND, BS+ Musculoskeletal: no deformities, strength intact in all 4 Skin: moist, warm, no rashes Neurological: no tremor with outstretched hands, DTR normal in all 4  ASSESSMENT: 1. Buchanan hyperthyroidism  2. Possible thyroid nodule  PLAN:  1. Patient with low TSH x 2, with normal fT4, without thyrotoxic sxs. - she does not appear to have exogenous causes for the low TSH.  - We discussed that possible causes of thyrotoxicosis are:  Thyroiditis Graves ds   Toxic multinodular goiter/ toxic adenoma (I can feel a possible isthmic nodule at palpation) - I suggested that we check the TSH, fT3 and fT4 and also had thyroid stimulating antibodies to screen for Graves' disease.  - If the tests remain abnormal I will obtain an uptake and scan to investigate the possible thyroid nodule felt at palpation (and this should also help differentiate between the 3 above possible etiologies). If tests are normal, will check a thyroid U/S and will repeat TFTs in 4 mo. - I did advice her that we might need to do thyroid ultrasound depending on the results of the uptake and scan (if a cold nodule is present) - is not tachycardic, anxious, or tremulous - is on Coreg for her CHF - RTC in 4 months, but likely sooner for repeat labs  Office Visit on 07/28/2013  Component Date Value Range Status  . TSH 07/28/2013 0.27* 0.35 - 5.50 uIU/mL Final  . T3, Free 07/28/2013 2.9  2.3 - 4.2 pg/mL Final  . Free T4 07/28/2013 1.09  0.60 - 1.60 ng/dL Final  TSI 76 (<161%).   TSH improved.  Will repeat labs in 4 mo when she returns. For now, we will obtain the uptake and scan to better characterize the nodule. If no cold defect >> no U/S needed, if cold defect >> will proceed with U/S.  Called and d/w pt  >> agrees >> will order the Uptake and scan.  08/31/2013 Results for the Uptake and scan still pending (pt postponed it twice) - will addend results when available.  09/26/2013 CLINICAL DATA: Hyperthyroidism, possible left thyroid nodule on palpation, TSH = 0.27  EXAM: THYROID SCAN AND UPTAKE - 24 HOURS  TECHNIQUE: Following the per oral administration of I-131 sodium iodide, the patient returned at 24 hours and uptake measurements were acquired with the uptake probe centered on the neck. Thyroid imaging was performed following the intravenous administration of the Tc-21m Pertechnetate.  COMPARISON: None  RADIOPHARMACEUTICALS: 14.0 microCuries I-131 Sodium Iodide and 10.0 mCi TC-30m Pertechnetate  FINDINGS: Normal 24 hr radio iodine uptake of 20%.  Images of the thyroid gland in  3 projections demonstrate slight enlargement of the inferior left lobe versus right.  Questionable cold nodule is seen laterally at the inferior pole left lobe.  Additionally, a an approximate 1 cm diameter nodule is identified at the upper pole of the right lobe.  Question small subtle warm nodule inferior pole right lobe.  IMPRESSION: Normal 24 hr radio iodine after 20%.  Potential cold nodules at the inferior pole left lobe in upper pole right lobe as well as a potential warm nodule at the inferior pole of the right lobe.  Sonographic evaluation recommended.  Electronically Signed By: Ulyses Southward M.D. On: 09/26/2013 10:22  I d/w pt and we will get a thyroid U/S.

## 2013-08-01 LAB — THYROID STIMULATING IMMUNOGLOBULIN: TSI: 76 % baseline (ref <140)

## 2013-08-03 ENCOUNTER — Other Ambulatory Visit (INDEPENDENT_AMBULATORY_CARE_PROVIDER_SITE_OTHER): Payer: BC Managed Care – PPO

## 2013-08-03 DIAGNOSIS — I5022 Chronic systolic (congestive) heart failure: Secondary | ICD-10-CM

## 2013-08-03 DIAGNOSIS — I509 Heart failure, unspecified: Secondary | ICD-10-CM

## 2013-08-04 LAB — BASIC METABOLIC PANEL
CO2: 24 mEq/L (ref 19–32)
Chloride: 110 mEq/L (ref 96–112)
Glucose, Bld: 87 mg/dL (ref 70–99)
Potassium: 3.8 mEq/L (ref 3.5–5.1)
Sodium: 140 mEq/L (ref 135–145)

## 2013-08-23 ENCOUNTER — Encounter (HOSPITAL_COMMUNITY): Payer: BC Managed Care – PPO

## 2013-08-24 ENCOUNTER — Encounter (HOSPITAL_COMMUNITY): Payer: BC Managed Care – PPO

## 2013-08-30 ENCOUNTER — Ambulatory Visit (HOSPITAL_COMMUNITY): Payer: BC Managed Care – PPO

## 2013-08-31 ENCOUNTER — Encounter: Payer: Self-pay | Admitting: Internal Medicine

## 2013-08-31 ENCOUNTER — Encounter (HOSPITAL_COMMUNITY): Payer: BC Managed Care – PPO

## 2013-09-25 ENCOUNTER — Encounter (HOSPITAL_COMMUNITY)
Admission: RE | Admit: 2013-09-25 | Discharge: 2013-09-25 | Disposition: A | Discharge status: Home / Self Care | Payer: BC Managed Care – PPO | Source: Ambulatory Visit | Attending: Internal Medicine | Admitting: Internal Medicine

## 2013-09-26 ENCOUNTER — Encounter (HOSPITAL_COMMUNITY): Payer: BC Managed Care – PPO

## 2013-09-26 ENCOUNTER — Encounter (HOSPITAL_COMMUNITY)
Admission: RE | Admit: 2013-09-26 | Discharge: 2013-09-26 | Disposition: A | Discharge status: Home / Self Care | Payer: BC Managed Care – PPO | Source: Ambulatory Visit | Attending: Internal Medicine | Admitting: Internal Medicine

## 2013-09-26 DIAGNOSIS — E0590 Thyrotoxicosis, unspecified without thyrotoxic crisis or storm: Secondary | ICD-10-CM | POA: Insufficient documentation

## 2013-09-26 MED ORDER — SODIUM PERTECHNETATE TC 99M INJECTION
10.0000 | Freq: Once | INTRAVENOUS | Status: AC | PRN
  Administered 2013-09-26: 10 via INTRAVENOUS

## 2013-09-26 MED ORDER — SODIUM IODIDE I 131 CAPSULE
14.0000 | Freq: Once | INTRAVENOUS | Status: AC | PRN
  Administered 2013-09-26: 14 via ORAL

## 2013-09-26 NOTE — Addendum Note (Signed)
Addended by: Carlus Pavlov on: 09/26/2013 05:13 PM   Modules accepted: Orders

## 2013-10-05 ENCOUNTER — Ambulatory Visit (HOSPITAL_COMMUNITY)
Admission: RE | Admit: 2013-10-05 | Discharge: 2013-10-05 | Disposition: A | Discharge status: Home / Self Care | Payer: BC Managed Care – PPO | Source: Ambulatory Visit | Attending: Internal Medicine | Admitting: Internal Medicine

## 2013-10-05 ENCOUNTER — Other Ambulatory Visit (HOSPITAL_COMMUNITY): Payer: Self-pay

## 2013-10-05 ENCOUNTER — Encounter (HOSPITAL_COMMUNITY): Payer: Self-pay

## 2013-10-05 VITALS — BP 148/88 | HR 59 | Resp 16 | Wt 148.2 lb

## 2013-10-05 DIAGNOSIS — I5022 Chronic systolic (congestive) heart failure: Secondary | ICD-10-CM

## 2013-10-05 DIAGNOSIS — I10 Essential (primary) hypertension: Secondary | ICD-10-CM | POA: Insufficient documentation

## 2013-10-05 DIAGNOSIS — F17200 Nicotine dependence, unspecified, uncomplicated: Secondary | ICD-10-CM | POA: Insufficient documentation

## 2013-10-05 DIAGNOSIS — I509 Heart failure, unspecified: Secondary | ICD-10-CM

## 2013-10-05 MED ORDER — LISINOPRIL 40 MG PO TABS: 40.0000 mg | ORAL_TABLET | Freq: Every day | ORAL | Status: DC

## 2013-10-05 MED ORDER — ISOSORBIDE MONONITRATE ER 30 MG PO TB24: 15.0000 mg | ORAL_TABLET | Freq: Every day | ORAL | Status: DC

## 2013-10-05 MED ORDER — SPIRONOLACTONE 25 MG PO TABS
25.0000 mg | ORAL_TABLET | Freq: Every day | ORAL | Status: DC
Start: 2013-10-05 — End: 2014-09-12

## 2013-10-05 NOTE — Progress Notes (Signed)
Patient ID: Kim Underwood, female   DOB: 02/19/1966, 48 y.o.   MRN: [id number]  Weight Range  145-147 pounds   Baseline proBNP    PCP: Dr Shela CommonsJ. [place]   HPI:  Kim Underwood is a [age] year with a history of severe cardiomyopathy, HTN, and tobacco abuse.    Initially evaluated by Dr Excell Seltzerooper 12/21/12 . Plan for cardiac MRI but this was denied by insurance.  She returns for follow up . Last visit lisinopril was increased to 30 mg once day. She has been out of spironolactone 3 days. Denies SOB/PND/Orthopnea. Weight at home 146-147 pounds.   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 She continues to smoke about 6 cigarettes per day. Not exercising.   03/03/13 RHC /LHC  RA mean 3  RV 35/7  PA 32/11, mean 19  PCWP mean 6  LV 123/21  AO 124/79  Oxygen saturations:  PA 69%  AO 92%  Cardiac Output (Fick) 4.6  Cardiac Index (Fick) 2.  Coronaries ok   02/13/13 Creatinine 0.82 Potassium 3.7 SPEP not detected  02/13/13 HIV non reactive  03/03/13 Creatinine 0.82 K 3.7  05/18/13 K+ 3.6, creatinine 0.59 06/14/13 K 4.2 Creatinine 0.7  TSH 0.18 08/03/13 K 3.8 creatinine 0.6   ECHO       12/14/12 EF 15%      06/08/13: EF 30-35% with global hypokinesis, normal RV, mild MR, grade I DD  SH: Lives alone. Current smoker   FH: Both her parents had heart failure. Mom had stents  ROS: All systems negative except as listed in HPI, PMH and Problem List.  Past Medical History  Diagnosis Date  . Hypertension, malignant   . Acute on chronic combined systolic and diastolic heart failure     Current Outpatient Prescriptions  Medication Sig Dispense Refill  . carvedilol (COREG) 12.5 MG tablet Take 1 tablet (12.5 mg total) by mouth 2 (two) times daily with a meal.  90 tablet  3  . hydrALAZINE (APRESOLINE) 25 MG tablet Take 1 tablet (25 mg total) by mouth 2 (two) times daily.  60 tablet  3  . ibuprofen (ADVIL,MOTRIN) 200 MG tablet Take 200 mg by mouth every 6 (six) hours as needed for pain.      Marland Kitchen. lisinopril  (PRINIVIL,ZESTRIL) 10 MG tablet Take 3 tablets (30 mg total) by mouth daily.  90 tablet  6  . potassium chloride (K-DUR,KLOR-CON) 10 MEQ tablet TAKE 1 TABLET BY MOUTH TWICE DAILY  60 tablet  5  . isosorbide mononitrate (IMDUR) 30 MG 24 hr tablet Take 0.5 tablets (15 mg total) by mouth daily.  15 tablet  3  . spironolactone (ALDACTONE) 25 MG tablet Take 1 tablet (25 mg total) by mouth daily.  30 tablet  6   No current facility-administered medications for this encounter.      Filed Vitals:   10/05/13 1407  BP: 148/88  Pulse: 59  Resp: 16  Weight: 148 lb 4 oz (67.246 kg)  SpO2: 100%   PHYSICAL EXAM: General:  Well appearing. No resp difficulty HEENT: normal Neck: supple. JVP ~5-6. Carotids 2+ bilaterally; no bruits. No lymphadenopathy or thryomegaly appreciated. Cor: PMI normal. Regular rate & rhythm. No rubs, gallops or murmurs. Lungs: clear Abdomen: soft, nontender, nondistended. No hepatosplenomegaly. No bruits or masses. Good bowel sounds. Extremities: no cyanosis, clubbing, rash, edema Neuro: alert & orientedx3, cranial nerves grossly intact. Moves all 4 extremities w/o difficulty. Affect pleasant.  ASSESSMENT & PLAN:  1. Chronic systolic HF: NICM, ?  Due to HTN.   EF 15% => 30-35% with grade I diastolic dysfunction (10/14 echo).  Repeat ECHO in April 2015. NYHA I  - Continue carvedilol 12.5 mg BID . Heart rate 59 will not up titrate. Reordered Cleda Daub to 25 mg daily. - Continue hydralazine 25 mg BID and IMDUR 15 mg daily. She can only take hydralazine twice a day due to her work schedule.  - Increase lisinopril to 40 mg daily  Which will be goal. Check BMET at follow up.   - Reinforced the need and importance of daily weights, a low sodium diet, and fluid restriction (less than 2 L a day). Instructed to call the HF clinic if weight increases more than 3 lbs overnight or 5 lbs in a week.  2. HTN: Elevated. Continue current dose of carvedilol, hydralazine, Cleda Daub, Imdur . Increase  lisinopril to 40 mg daily.  3. Smoking: Contines to smoke about 7 cigarettes per day. Declines smoking cessation at this time. Encouraged to stop smoking.   Follow up in 2 weeks and continue medication titration.  Plan to repeat ECHO in April after HF meds optimized   [name] NP-C 2:22 PM

## 2013-10-05 NOTE — Patient Instructions (Signed)
Take 40 mg of lisinopril daily  Do the following things EVERYDAY: 1) Weigh yourself in the morning before breakfast. Write it down and keep it in a log. 2) Take your medicines as prescribed 3) Eat low salt foods-Limit salt (sodium) to 2000 mg per day.  4) Stay as active as you can everyday 5) Limit all fluids for the day to less than 2 liters  Follow up in 2 weeks

## 2013-10-19 ENCOUNTER — Other Ambulatory Visit (HOSPITAL_COMMUNITY): Payer: Self-pay | Admitting: Adult Health

## 2013-10-19 ENCOUNTER — Encounter (HOSPITAL_COMMUNITY): Payer: BC Managed Care – PPO

## 2013-11-23 ENCOUNTER — Other Ambulatory Visit (HOSPITAL_COMMUNITY): Payer: Self-pay | Admitting: Adult Health

## 2013-11-23 ENCOUNTER — Other Ambulatory Visit (HOSPITAL_COMMUNITY): Payer: Self-pay | Admitting: Internal Medicine

## 2013-11-27 ENCOUNTER — Ambulatory Visit: Payer: BC Managed Care – PPO | Admitting: Internal Medicine

## 2013-12-12 NOTE — Progress Notes (Signed)
Patient ID: Kim Underwood, female   DOB: 11/21/1965, 48 y.o.   MRN: [id number]   Weight Range  145-147 pounds   Baseline proBNP    PCP: Dr Shela CommonsJ. Copland   HPI:  Kim Underwood is a [age] year with a history of severe cardiomyopathy, HTN, and tobacco abuse.   Initially evaluated by Dr Excell Seltzerooper 12/21/12 . Plan for cardiac MRI but this was denied by insurance.  She returns for follow up.  She has missed a few appointments because she could not get off work. Last visit lisinopril was increased to 40 mg daily. Overall she is feeling great. Denies SOB/PND/Orthopnea. Able to walk up 4 flights of stairs. Weight at home 147-148 pounds.  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 She continues to smoke about 6 cigarettes per day. Not exercising.   03/03/13 RHC /LHC  RA mean 3  RV 35/7  PA 32/11, mean 19  PCWP mean 6  LV 123/21  AO 124/79  Oxygen saturations:  PA 69%  AO 92%  Cardiac Output (Fick) 4.6  Cardiac Index (Fick) 2.  Coronaries ok   02/13/13 Creatinine 0.82 Potassium 3.7 SPEP not detected  02/13/13 HIV non reactive  03/03/13 Creatinine 0.82 K 3.7  05/18/13 K+ 3.6, creatinine 0.59 06/14/13 K 4.2 Creatinine 0.7  TSH 0.18 08/03/13 K 3.8 creatinine 0.6   ECHO       12/14/12 EF 15%      06/08/13: EF 30-35% with global hypokinesis, normal RV, mild MR, grade I DD  SH: Lives alone. Current smoker   FH: Both her parents had heart failure. Mom had stents  ROS: All systems negative except as listed in HPI, PMH and Problem List.  Past Medical History  Diagnosis Date  . Hypertension, malignant   . Acute on chronic combined systolic and diastolic heart failure     Current Outpatient Prescriptions  Medication Sig Dispense Refill  . acetaminophen (TYLENOL) 325 MG tablet Take 650 mg by mouth every 4 (four) hours as needed for moderate pain.      . carvedilol (COREG) 12.5 MG tablet Take 1 tablet (12.5 mg total) by mouth 2 (two) times daily with a meal.  90 tablet  3  . hydrALAZINE (APRESOLINE) 25 MG  tablet TAKE ONE TABLET BY MOUTH TWICE DAILY  60 tablet  3  . isosorbide mononitrate (IMDUR) 30 MG 24 hr tablet Take 0.5 tablets (15 mg total) by mouth daily.  15 tablet  3  . lisinopril (PRINIVIL,ZESTRIL) 40 MG tablet Take 1 tablet (40 mg total) by mouth daily.  30 tablet  3  . potassium chloride (K-DUR,KLOR-CON) 10 MEQ tablet TAKE 1 TABLET BY MOUTH TWICE DAILY  60 tablet  5  . spironolactone (ALDACTONE) 25 MG tablet Take 1 tablet (25 mg total) by mouth daily.  30 tablet  6  . ibuprofen (ADVIL,MOTRIN) 200 MG tablet Take 200 mg by mouth every 6 (six) hours as needed for pain.       No current facility-administered medications for this encounter.      Filed Vitals:   12/14/13 1423  BP: 124/81  Pulse: 59  Resp: 18  Weight: 148 lb 8 oz (67.359 kg)  SpO2: 100%   PHYSICAL EXAM: General:  Well appearing. No resp difficulty HEENT: normal Neck: supple. JVP ~5-6. Carotids 2+ bilaterally; no bruits. No lymphadenopathy or thryomegaly appreciated. Cor: PMI normal. Regular rate & rhythm. No rubs, gallops or murmurs. Lungs: clear Abdomen: soft, nontender, nondistended. No hepatosplenomegaly. No bruits or  masses. Good bowel sounds. Extremities: no cyanosis, clubbing, rash, edema Neuro: alert & orientedx3, cranial nerves grossly intact. Moves all 4 extremities w/o difficulty. Affect pleasant.  ASSESSMENT & PLAN:  1. Chronic systolic HF: NICM, ? Due to HTN.   EF 15% => 30-35% with grade I diastolic dysfunction (10/14 echo).   NYHA I - Volume status stable. Continue 25 mg spironolactone daily.  Continue carvedilol 12.5 mg BID  Will nott increase due to bradycardia.  -Continue hydralazine 25 mg BID and IMDUR 15 mg daily. She can only take hydralazine twice a day due to her work schedule.  - On goal lisinopril to 40 mg daily   - Check ECHO at next appointment. If EF remains < 35% will need to consider ICD.  - Reinforced the need and importance of daily weights, a low sodium diet, and fluid  restriction (less than 2 L a day). Instructed to call the HF clinic if weight increases more than 3 lbs overnight or 5 lbs in a week.  Check BMET today.  2. HTN- Continue current dose of carvedilol, hydralazine, Spiro, Imdur and lisinopril.   3. Smoking: Contines to smoke about 7 cigarettes per day. Declines smoking cessation at this time. Encouraged to stop smoking.   Follow up in  4 weeks with an ECHO.   Amy D Clegg NP-C 2:38 PM

## 2013-12-14 ENCOUNTER — Ambulatory Visit (HOSPITAL_COMMUNITY)
Admission: RE | Admit: 2013-12-14 | Discharge: 2013-12-14 | Disposition: A | Discharge status: Home / Self Care | Payer: Commercial Managed Care - PPO | Source: Ambulatory Visit | Attending: Internal Medicine | Admitting: Internal Medicine

## 2013-12-14 ENCOUNTER — Encounter (HOSPITAL_COMMUNITY): Payer: Self-pay

## 2013-12-14 VITALS — BP 124/81 | HR 59 | Resp 18 | Wt 148.5 lb

## 2013-12-14 DIAGNOSIS — I5022 Chronic systolic (congestive) heart failure: Secondary | ICD-10-CM

## 2013-12-14 DIAGNOSIS — F17200 Nicotine dependence, unspecified, uncomplicated: Secondary | ICD-10-CM

## 2013-12-14 DIAGNOSIS — I10 Essential (primary) hypertension: Secondary | ICD-10-CM

## 2013-12-14 DIAGNOSIS — I509 Heart failure, unspecified: Secondary | ICD-10-CM

## 2013-12-14 LAB — BASIC METABOLIC PANEL
BUN: 14 mg/dL (ref 6–23)
CALCIUM: 9.1 mg/dL (ref 8.4–10.5)
CO2: 23 mEq/L (ref 19–32)
Chloride: 103 mEq/L (ref 96–112)
Creatinine, Ser: 0.57 mg/dL (ref 0.50–1.10)
GFR calc Af Amer: >90 mL/min (ref >90)
GLUCOSE: 100 mg/dL — AB (ref 70–99)
Potassium: 3.8 mEq/L (ref 3.7–5.3)
SODIUM: 140 meq/L (ref 137–147)

## 2013-12-14 NOTE — Patient Instructions (Signed)
Follow up in 1 month with an ECHO  Do the following things EVERYDAY: 1) Weigh yourself in the morning before breakfast. Write it down and keep it in a log. 2) Take your medicines as prescribed 3) Eat low salt foods-Limit salt (sodium) to 2000 mg per day.  4) Stay as active as you can everyday 5) Limit all fluids for the day to less than 2 liters 

## 2014-01-11 NOTE — Progress Notes (Signed)
Patient ID: Kim Underwood, female   DOB: 08/28/1965, 48 y.o.   MRN: [id number]   PCP: Dr Shela CommonsJ. Copland   HPI:  Mrs Kim Underwood is a [age] year with a history of severe cardiomyopathy, HTN, and tobacco abuse.   Initially evaluated by Dr Excell Seltzerooper 12/21/12 . Plan for cardiac MRI but this was denied by insurance.  She returns for follow up. Overall feeling great.  Denies SOB/PND/Orthopnea. Able to walk up steps. Weight at home trending up. Working over every 8 hours. Not weighing. Says she has been eating a lot desserts. Smoking < 8 per day. She did not take medications this am because she usually takes them around 9:30.     03/03/13 RHC [facility]/LHC  RA mean 3  RV 35/7  PA 32/11, mean 19  PCWP mean 6  LV 123/21  AO 124/79  Oxygen saturations:  PA 69%  AO 92%  Cardiac Output (Fick) 4.6  Cardiac Index (Fick) 2.  Coronaries ok   02/13/13 Creatinine 0.82 Potassium 3.7 SPEP not detected  02/13/13 HIV non reactive  03/03/13 Creatinine 0.82 K 3.7  05/18/13 K+ 3.6, creatinine 0.59 06/14/13 K 4.2 Creatinine 0.7  TSH 0.18 08/03/13 K 3.8 creatinine 0.6  12/14/13 K 3.8 Creatinine 0.57  ECHO       12/14/12 EF 15%      06/08/13: EF 30-35% with global hypokinesis, normal RV, mild MR, grade I DD  SH: Lives alone. Current smoker . Works full time at [employer]  FH: Both her parents had heart failure. Mom had stents  ROS: All systems negative except as listed in HPI, PMH and Problem List.  Past Medical History  Diagnosis Date  . Hypertension, malignant   . Acute on chronic combined systolic and diastolic heart failure     Current Outpatient Prescriptions  Medication Sig Dispense Refill  . acetaminophen (TYLENOL) 325 MG tablet Take 650 mg by mouth every 4 (four) hours as needed for moderate pain.      . carvedilol (COREG) 12.5 MG tablet Take 1 tablet (12.5 mg total) by mouth 2 (two) times daily with a meal.  90 tablet  3  . hydrALAZINE (APRESOLINE) 25 MG tablet TAKE ONE TABLET BY MOUTH TWICE DAILY  60 tablet  3  .  ibuprofen (ADVIL,MOTRIN) 200 MG tablet Take 200 mg by mouth every 6 (six) hours as needed for pain.      . isosorbide mononitrate (IMDUR) 30 MG 24 hr tablet Take 0.5 tablets (15 mg total) by mouth daily.  15 tablet  3  . lisinopril (PRINIVIL,ZESTRIL) 40 MG tablet Take 1 tablet (40 mg total) by mouth daily.  30 tablet  3  . spironolactone (ALDACTONE) 25 MG tablet Take 1 tablet (25 mg total) by mouth daily.  30 tablet  6   No current facility-administered medications for this encounter.      Filed Vitals:   01/12/14 0848  BP: 142/94  Pulse: 58  Weight: 153 lb (69.4 kg)  SpO2: 100%   PHYSICAL EXAM: General:  Well appearing. No resp difficulty HEENT: normal Neck: supple. JVP ~5-6. Carotids 2+ bilaterally; no bruits. No lymphadenopathy or thryomegaly appreciated. Cor: PMI normal. Regular rate & rhythm. No rubs, gallops or murmurs. Lungs: clear Abdomen: soft, nontender, nondistended. No hepatosplenomegaly. No bruits or masses. Good bowel sounds. Extremities: no cyanosis, clubbing, rash, edema Neuro: alert & orientedx3, cranial nerves grossly intact. Moves all 4 extremities w/o difficulty. Affect pleasant.  ASSESSMENT & PLAN:  1. Chronic systolic HF: NICM, ? Due  to HTN.   EF 15% => 30-35% with grade I diastolic dysfunction (10/14 echo).   Reviewed BMET from April. Renal function stable. Doing great.Marland Kitchen NYHA I - Volume status stable. Continue 25 mg spironolactone daily.  Continue carvedilol 12.5 mg BID  Will not increase due to bradycardia.  -Continue hydralazine 25 mg BID and IMDUR 15 mg daily. She can only take hydralazine twice a day due to her work schedule.  - On goal lisinopril to 40 mg daily  . Reeviewed last  -Had  ECHO today will call with results.  She understands if EF remains < 35% will need to refer EP for ICD.  - Reinforced the need and importance of daily weights, a low sodium diet, and fluid restriction (less than 2 L a day). Instructed to call the HF clinic if weight  increases more than 3 lbs overnight or 5 lbs in a week.  2. HTN- Continue current dose of carvedilol, hydralazine, Spiro, Imdur and lisinopril.   3. Smoking: Contines to smoke about 7-8 cigarettes per day. Declines smoking cessation at this time. Encouraged to stop smoking.   Follow up in 3 months    Amy D Clegg NP-C 8:52 AM

## 2014-01-12 ENCOUNTER — Telehealth (HOSPITAL_COMMUNITY): Payer: Self-pay | Admitting: Adult Health

## 2014-01-12 ENCOUNTER — Ambulatory Visit (HOSPITAL_COMMUNITY)
Admission: RE | Admit: 2014-01-12 | Discharge: 2014-01-12 | Disposition: A | Discharge status: Home / Self Care | Payer: Commercial Managed Care - PPO | Source: Ambulatory Visit | Attending: Family Medicine | Admitting: Family Medicine

## 2014-01-12 ENCOUNTER — Ambulatory Visit (HOSPITAL_BASED_OUTPATIENT_CLINIC_OR_DEPARTMENT_OTHER)
Admission: RE | Admit: 2014-01-12 | Discharge: 2014-01-12 | Disposition: A | Discharge status: Home / Self Care | Payer: Commercial Managed Care - PPO | Source: Ambulatory Visit | Attending: Internal Medicine | Admitting: Internal Medicine

## 2014-01-12 VITALS — BP 142/94 | HR 58 | Wt 153.0 lb

## 2014-01-12 DIAGNOSIS — F17200 Nicotine dependence, unspecified, uncomplicated: Secondary | ICD-10-CM

## 2014-01-12 DIAGNOSIS — I10 Essential (primary) hypertension: Secondary | ICD-10-CM | POA: Insufficient documentation

## 2014-01-12 DIAGNOSIS — I5022 Chronic systolic (congestive) heart failure: Secondary | ICD-10-CM

## 2014-01-12 DIAGNOSIS — I498 Other specified cardiac arrhythmias: Secondary | ICD-10-CM | POA: Insufficient documentation

## 2014-01-12 DIAGNOSIS — I509 Heart failure, unspecified: Secondary | ICD-10-CM | POA: Insufficient documentation

## 2014-01-12 DIAGNOSIS — I517 Cardiomegaly: Secondary | ICD-10-CM

## 2014-01-12 MED ORDER — HYDRALAZINE HCL 25 MG PO TABS: 25.0000 mg | ORAL_TABLET | Freq: Two times a day (BID) | ORAL | Status: DC

## 2014-01-12 MED ORDER — CARVEDILOL 12.5 MG PO TABS: 12.5000 mg | ORAL_TABLET | Freq: Two times a day (BID) | ORAL | Status: DC

## 2014-01-12 NOTE — Telephone Encounter (Signed)
Provided with ECHO results. EF now. 35-40%. No need for ICD.  Continue current plan. Follow up in 3 months  Ms Behl verbalized understanding.   Hyun Marsalis D Nieko Clarin NP=C 6:24 PM

## 2014-01-12 NOTE — Patient Instructions (Signed)
Follow up in 3 months   Do the following things EVERYDAY: 1) Weigh yourself in the morning before breakfast. Write it down and keep it in a log. 2) Take your medicines as prescribed 3) Eat low salt foods-Limit salt (sodium) to 2000 mg per day.  4) Stay as active as you can everyday 5) Limit all fluids for the day to less than 2 liters  

## 2014-01-12 NOTE — Progress Notes (Signed)
  Echocardiogram 2D Echocardiogram has been performed.  [name] 01/12/2014, 8:48 AM

## 2014-03-29 ENCOUNTER — Other Ambulatory Visit (HOSPITAL_COMMUNITY): Payer: Self-pay | Admitting: Adult Health

## 2014-04-05 ENCOUNTER — Other Ambulatory Visit (HOSPITAL_COMMUNITY): Payer: Self-pay | Admitting: Adult Health

## 2014-04-13 ENCOUNTER — Ambulatory Visit (HOSPITAL_COMMUNITY)
Admission: RE | Admit: 2014-04-13 | Discharge: 2014-04-13 | Disposition: A | Discharge status: Home / Self Care | Payer: Commercial Managed Care - PPO | Source: Ambulatory Visit | Attending: Cardiology | Admitting: Cardiology

## 2014-04-13 ENCOUNTER — Encounter (HOSPITAL_COMMUNITY): Payer: Self-pay

## 2014-04-13 VITALS — BP 122/80 | HR 77 | Wt 156.4 lb

## 2014-04-13 DIAGNOSIS — I5022 Chronic systolic (congestive) heart failure: Secondary | ICD-10-CM | POA: Diagnosis present

## 2014-04-13 DIAGNOSIS — I10 Essential (primary) hypertension: Secondary | ICD-10-CM | POA: Insufficient documentation

## 2014-04-13 DIAGNOSIS — I509 Heart failure, unspecified: Secondary | ICD-10-CM | POA: Diagnosis not present

## 2014-04-13 DIAGNOSIS — I428 Other cardiomyopathies: Secondary | ICD-10-CM | POA: Diagnosis not present

## 2014-04-13 DIAGNOSIS — F17200 Nicotine dependence, unspecified, uncomplicated: Secondary | ICD-10-CM | POA: Diagnosis not present

## 2014-04-13 DIAGNOSIS — Z8249 Family history of ischemic heart disease and other diseases of the circulatory system: Secondary | ICD-10-CM | POA: Diagnosis not present

## 2014-04-13 HISTORY — DX: Chronic systolic (congestive) heart failure: I50.22

## 2014-04-13 HISTORY — DX: Tobacco use: Z72.0

## 2014-04-13 HISTORY — DX: Other cardiomyopathies: I42.8

## 2014-04-13 LAB — BASIC METABOLIC PANEL
Anion gap: 13 (ref 5–15)
BUN: 15 mg/dL (ref 6–23)
CO2: 22 mEq/L (ref 19–32)
Calcium: 8.8 mg/dL (ref 8.4–10.5)
Chloride: 104 mEq/L (ref 96–112)
Creatinine, Ser: 0.62 mg/dL (ref 0.50–1.10)
Glucose, Bld: 127 mg/dL — ABNORMAL HIGH (ref 70–99)
POTASSIUM: 3.9 meq/L (ref 3.7–5.3)
SODIUM: 139 meq/L (ref 137–147)

## 2014-04-13 MED ORDER — CARVEDILOL 12.5 MG PO TABS: ORAL_TABLET | ORAL | Status: DC

## 2014-04-13 MED ORDER — ISOSORBIDE MONONITRATE ER 30 MG PO TB24
30.0000 mg | ORAL_TABLET | Freq: Every day | ORAL | Status: DC
Start: 2014-04-13 — End: 2015-06-25

## 2014-04-13 NOTE — Progress Notes (Signed)
Patient ID: Kim Underwood, female   DOB: 10-Sep-1965, 48 y.o.   MRN: [id number]   PCP: Dr Shela Commons. Copland   HPI:  Kim Underwood is a 48 year with a history of NICM, chronic systolic heart failure, HTN, and tobacco abuse.   Initially evaluated by Dr Excell Seltzer 12/21/12 . Plan for cardiac MRI but this was denied by insurance.  Follow up for Heart Failure:  Doing great. Denies SOB, orthopnea, PND or CP. 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 Nothing stops her. Able to go around the whole grocery store without stopping. Smoking about 8 cigarettes a day. Weight at home 150 lbs. Following a low salt diet and drinking more than 2L a day.    02/13/13 Creatinine 0.82 Potassium 3.7 SPEP not detected  02/13/13 HIV non reactive  03/03/13 Creatinine 0.82 K 3.7  05/18/13 K+ 3.6, creatinine 0.59 06/14/13 K 4.2 Creatinine 0.7  TSH 0.18 08/03/13 K 3.8 creatinine 0.6  12/14/13 K 3.8 Creatinine 0.57  ECHO       12/14/12 EF 15%      06/08/13: EF 30-35% with global hypokinesis, normal RV, mild MR, grade I DD  SH: Lives alone. Current smoker . Works full time at [employer]  FH: Both her parents had heart failure. Mom had stents  ROS: All systems negative except as listed in HPI, PMH and Problem List.  Past Medical History  Diagnosis Date  . Hypertension, malignant   . NICM (nonischemic cardiomyopathy)     LHC (02/2013): [facility]: no sig. dz, LAD: luminal irregularities, LCx: luminal irregularities, co-dominant, RCA: luminal irregularities, rel small, co-dominant with LCx  . Chronic systolic heart failure     a. NICM Underwood. ECHO (11/2012): EF 15-20%, Diff HK, grade IV DD, mod MR, LA mildly dilated c. RHC (02/2013): RA: 3, RV: 35/7, PA: 32/11 (19), PCWP: 6, PA sat 69%, Fick CO/CI: 4.6 / 2.8 d. ECHO (12/2013): EF 35-40%, diff HK, grade I DD, trivial MR, LA mildly dialted, RV nl  . Tobacco abuse     Current Outpatient Prescriptions  Medication Sig Dispense Refill  . acetaminophen (TYLENOL) 325 MG tablet Take 650 mg by mouth every 4  (four) hours as needed for moderate pain.      . carvedilol (COREG) 12.5 MG tablet Take 1 tablet (12.5 mg total) by mouth 2 (two) times daily with a meal.  60 tablet  6  . hydrALAZINE (APRESOLINE) 25 MG tablet Take 1 tablet (25 mg total) by mouth 2 (two) times daily.  60 tablet  6  . ibuprofen (ADVIL,MOTRIN) 200 MG tablet Take 200 mg by mouth every 6 (six) hours as needed for pain.      . isosorbide mononitrate (IMDUR) 30 MG 24 hr tablet Take 0.5 tablets (15 mg total) by mouth daily.  15 tablet  3  . lisinopril (PRINIVIL,ZESTRIL) 40 MG tablet TAKE ONE TABLET BY MOUTH ONCE DAILY  30 tablet  6  . spironolactone (ALDACTONE) 25 MG tablet Take 1 tablet (25 mg total) by mouth daily.  30 tablet  6   No current facility-administered medications for this encounter.    Filed Vitals:   04/13/14 0833  BP: 122/80  Pulse: 77  Weight: 156 lb 6 oz (70.931 kg)  SpO2: 100%   PHYSICAL EXAM: General:  Well appearing. No resp difficulty HEENT: normal Neck: supple. JVP ~5-6. Carotids 2+ bilaterally; no bruits. No lymphadenopathy or thryomegaly appreciated. Cor: PMI normal. Regular rate & rhythm. No rubs, gallops or murmurs. Lungs: clear Abdomen:  soft, nontender, nondistended. No hepatosplenomegaly. No bruits or masses. Good bowel sounds. Extremities: no cyanosis, clubbing, rash, edema Neuro: alert & orientedx3, cranial nerves grossly intact. Moves all 4 extremities w/o difficulty. Affect pleasant.  ASSESSMENT & PLAN:  1. Chronic systolic HF: NICM, ? Due to HTN.  EF 30-35% with grade I diastolic dysfunction (10/14 echo).    - NYHA I symptoms and volume status stable. She is not on any diuretics. Instructed to call if weight starts trending up.  - Increase coreg to 12.5 mg q am and 18.75 mg q pm. Told to call if any dizziness or fatigue.   -Continue hydralazine 25 mg BID and increase Imdur to 30 mg daily.  - On goal lisinopril to 40 mg daily   - Repeat ECHO next visit.  - Reinforced the need and  importance of daily weights, a low sodium diet, and fluid restriction (less than 2 L a day). Instructed to call the HF clinic if weight increases more than 3 lbs overnight or 5 lbs in a week.  2. HTN- stable. As above increase BB for LV dysfunction and Imdur for afterload reduction.    3. Smoking: Contines to smoke about 7-8 cigarettes per day. Declines smoking cessation at this time. Encouraged to stop smoking.   F/U 6 months with ECHO Kim Potash B NP-C 8:34 AM  Increase Imdur to 30 mg daily. Increase coreg 12.5 mg q am and 18.75 mg q pm Follow up in 6 months with ECHO

## 2014-04-13 NOTE — Patient Instructions (Signed)
Doing great!!!!  Increase your Imdur to 30 mg (1 tablet) daily.  Increase your coreg to 12.5 mg (1 tablet) in the morning and 18.75 mg (1 1/2 tablets) in the evening.  Follow up in 6 months with ECHO  Call any issues.  Do the following things EVERYDAY: 1) Weigh yourself in the morning before breakfast. Write it down and keep it in a log. 2) Take your medicines as prescribed 3) Eat low salt foods-Limit salt (sodium) to 2000 mg per day.  4) Stay as active as you can everyday 5) Limit all fluids for the day to less than 2 liters 6)

## 2014-05-14 ENCOUNTER — Other Ambulatory Visit (HOSPITAL_COMMUNITY): Payer: Self-pay

## 2014-05-14 MED ORDER — CARVEDILOL 6.25 MG PO TABS: ORAL_TABLET | ORAL | Status: DC

## 2014-09-12 ENCOUNTER — Other Ambulatory Visit (HOSPITAL_COMMUNITY): Payer: Self-pay | Admitting: Cardiology

## 2014-09-12 DIAGNOSIS — I5022 Chronic systolic (congestive) heart failure: Secondary | ICD-10-CM

## 2014-09-12 MED ORDER — SPIRONOLACTONE 25 MG PO TABS: 25.0000 mg | ORAL_TABLET | Freq: Every day | ORAL | Status: DC

## 2014-10-31 ENCOUNTER — Inpatient Hospital Stay (HOSPITAL_COMMUNITY): Admission: RE | Admit: 2014-10-31 | Payer: Commercial Managed Care - PPO | Source: Ambulatory Visit

## 2014-12-24 ENCOUNTER — Ambulatory Visit (HOSPITAL_COMMUNITY)
Admission: RE | Admit: 2014-12-24 | Discharge: 2014-12-24 | Disposition: A | Discharge status: Home / Self Care | Payer: Commercial Managed Care - PPO | Source: Ambulatory Visit | Attending: Cardiology | Admitting: Cardiology

## 2014-12-24 ENCOUNTER — Encounter (HOSPITAL_COMMUNITY): Payer: Self-pay

## 2014-12-24 VITALS — BP 101/54 | HR 68 | Resp 18 | Wt 164.0 lb

## 2014-12-24 DIAGNOSIS — F17200 Nicotine dependence, unspecified, uncomplicated: Secondary | ICD-10-CM

## 2014-12-24 DIAGNOSIS — Z720 Tobacco use: Secondary | ICD-10-CM

## 2014-12-24 DIAGNOSIS — I5022 Chronic systolic (congestive) heart failure: Secondary | ICD-10-CM | POA: Diagnosis not present

## 2014-12-24 DIAGNOSIS — I10 Essential (primary) hypertension: Secondary | ICD-10-CM | POA: Insufficient documentation

## 2014-12-24 DIAGNOSIS — I159 Secondary hypertension, unspecified: Secondary | ICD-10-CM

## 2014-12-24 DIAGNOSIS — Z79899 Other long term (current) drug therapy: Secondary | ICD-10-CM | POA: Diagnosis not present

## 2014-12-24 DIAGNOSIS — F17210 Nicotine dependence, cigarettes, uncomplicated: Secondary | ICD-10-CM | POA: Diagnosis not present

## 2014-12-24 LAB — BASIC METABOLIC PANEL
Anion gap: 6 (ref 5–15)
BUN: 16 mg/dL (ref 6–20)
CHLORIDE: 108 mmol/L (ref 101–111)
CO2: 23 mmol/L (ref 22–32)
CREATININE: 0.71 mg/dL (ref 0.44–1.00)
Calcium: 9.1 mg/dL (ref 8.9–10.3)
GFR calc Af Amer: >60 mL/min (ref >60)
GFR calc non Af Amer: >60 mL/min (ref >60)
Glucose, Bld: 134 mg/dL — ABNORMAL HIGH (ref 70–99)
POTASSIUM: 3.8 mmol/L (ref 3.5–5.1)
Sodium: 137 mmol/L (ref 135–145)

## 2014-12-24 MED ORDER — CARVEDILOL 12.5 MG PO TABS: 12.5000 mg | ORAL_TABLET | Freq: Two times a day (BID) | ORAL | Status: DC

## 2014-12-24 NOTE — Progress Notes (Signed)
Patient ID: Kim Underwood, female   DOB: [birth year], 49 y.o.   MRN: [id number]   PCP: Dr Shela Commons. Copland   HPI:  Kim Underwood is a 1 year with a history of NICM, chronic systolic heart failure, HTN, and tobacco abuse.  Initially evaluated by Dr Excell Seltzer 12/21/12 . Plan for cardiac MRI but this was denied by insurance.  Follow up for Heart Failure:  Last visit carvedilol was increased was but she was confused and took carvedilol 6.25 mg in and 18.75 mg in pm.  Overall she is feeling pretty good. Working over 50 hours a week.  Denies SOB, orthopnea, PND/CP .   Stopped smoking but started smoking again about about 8 cigarettes a day. Weight at home  has been going down. 150 lbs. Following a low salt diet and drinking more than 2L a day.   02/13/13 Creatinine 0.82 Potassium 3.7 SPEP not detected  02/13/13 HIV non reactive  03/03/13 Creatinine 0.82 K 3.7  05/18/13 K+ 3.6, creatinine 0.59 06/14/13 K 4.2 Creatinine 0.7  TSH 0.18 08/03/13 K 3.8 creatinine 0.6  12/14/13 K 3.8 Creatinine 0.57 04/13/2014: K 3.9 Creatinine 0.62   ECHO       12/14/12 EF 15%      06/08/13: EF 30-35% with global hypokinesis, normal RV, mild MR, grade I DD       May 2015: EF 35-40%   SH: Lives alone. Current smoker . Works full time at [employer]  FH: Both her parents had heart failure. Mom had stents  ROS: All systems negative except as listed in HPI, PMH and Problem List.  Past Medical History  Diagnosis Date  . Hypertension, malignant   . NICM (nonischemic cardiomyopathy)     LHC (02/2013): [place]: no sig. dz, LAD: luminal irregularities, LCx: luminal irregularities, co-dominant, RCA: luminal irregularities, rel small, co-dominant with LCx  . Chronic systolic heart failure     a. NICM b. ECHO (11/2012): EF 15-20%, Diff HK, grade IV DD, mod MR, LA mildly dilated c. RHC (02/2013): RA: 3, RV: 35/7, PA: 32/11 (19), PCWP: 6, PA sat 69%, Fick CO/CI: 4.6 / 2.8 d. ECHO (12/2013): EF 35-40%, diff HK, grade I DD, trivial MR, LA mildly  dialted, RV nl  . Tobacco abuse     Current Outpatient Prescriptions  Medication Sig Dispense Refill  . acetaminophen (TYLENOL) 325 MG tablet Take 650 mg by mouth every 4 (four) hours as needed for moderate pain.    . carvedilol (COREG) 6.25 MG tablet Take 12.5 mg (2 tablet) in the morning and 18.75 mg (3 tablets) in the evening. (Patient taking differently: Take 6.25 mg (1 tablet) in the morning and 18.75 mg (3 tablets) in the evening.) 150 tablet 3  . hydrALAZINE (APRESOLINE) 25 MG tablet Take 1 tablet (25 mg total) by mouth 2 (two) times daily. 60 tablet 6  . ibuprofen (ADVIL,MOTRIN) 200 MG tablet Take 200 mg by mouth every 6 (six) hours as needed for pain.    . isosorbide mononitrate (IMDUR) 30 MG 24 hr tablet Take 1 tablet (30 mg total) by mouth daily. 90 tablet 3  . lisinopril (PRINIVIL,ZESTRIL) 40 MG tablet TAKE ONE TABLET BY MOUTH ONCE DAILY 30 tablet 6  . spironolactone (ALDACTONE) 25 MG tablet Take 1 tablet (25 mg total) by mouth daily. 30 tablet 6   No current facility-administered medications for this encounter.    Filed Vitals:   12/24/14 1412  BP: 101/54  Pulse: 68  Resp: 18  Weight: 164 lb (  74.39 kg)  SpO2: 100%   PHYSICAL EXAM: General:  Well appearing. No resp difficulty. Ambulated in the clinic without difficulty. HEENT: normal Neck: supple. JVP ~5-6. Carotids 2+ bilaterally; no bruits. No lymphadenopathy or thryomegaly appreciated. Cor: PMI normal. Regular rate & rhythm. No rubs, gallops or murmurs. Lungs: clear Abdomen: soft, nontender, nondistended. No hepatosplenomegaly. No bruits or masses. Good bowel sounds. Extremities: no cyanosis, clubbing, rash, edema Neuro: alert & orientedx3, cranial nerves grossly intact. Moves all 4 extremities w/o difficulty. Affect pleasant.  ASSESSMENT & PLAN:  1. Chronic systolic HF: NICM, ? Due to HTN.  EF 35-40% with grade I diastolic dysfunction (10/14 echo).    - NYHA I symptoms and volume status stable. She is not on any  diuretics. Instructed to call if weight starts trending up.  - Taking coreg 6.25 mg in am and 18.75 mg in pm. Today I will switch to coreg to 12.5 mg twice a day.    -Continue hydralazine 25 mg BID and Imdur to 30 mg daily.  - On goal lisinopril to 40 mg daily  - Check BMET today.   - Reinforced the need and importance of daily weights, a low sodium diet, and fluid restriction (less than 2 L a day). Instructed to call the HF clinic if weight increases more than 3 lbs overnight or 5 lbs in a week.  2. HTN- stable.    3. Smoking: Contines to smoke about 7-8 cigarettes per day. Declines smoking cessation at this time. Discussed smoking cessation.   F/U 3 months with an ECHO.    Daiveon Markman NP-C 2:18 PM

## 2014-12-24 NOTE — Patient Instructions (Signed)
Follow up in 3 months  Please take carvedilol 12.5 mg twice a day this 2 tablets in the am and 2 tablets in the pm.   When you run out you will have a new prescription.   Do the following things EVERYDAY: 1) Weigh yourself in the morning before breakfast. Write it down and keep it in a log. 2) Take your medicines as prescribed 3) Eat low salt foods-Limit salt (sodium) to 2000 mg per day.  4) Stay as active as you can everyday 5) Limit all fluids for the day to less than 2 liters

## 2014-12-25 ENCOUNTER — Encounter (HOSPITAL_COMMUNITY): Payer: Commercial Managed Care - PPO

## 2015-01-11 ENCOUNTER — Ambulatory Visit (INDEPENDENT_AMBULATORY_CARE_PROVIDER_SITE_OTHER): Payer: Commercial Managed Care - PPO | Admitting: Family Medicine

## 2015-01-11 VITALS — BP 115/80 | HR 65 | Temp 98.0°F | Resp 17 | Ht 62.0 in | Wt 163.4 lb

## 2015-01-11 DIAGNOSIS — J0100 Acute maxillary sinusitis, unspecified: Secondary | ICD-10-CM

## 2015-01-11 DIAGNOSIS — J209 Acute bronchitis, unspecified: Secondary | ICD-10-CM | POA: Diagnosis not present

## 2015-01-11 MED ORDER — HYDROCODONE-HOMATROPINE 5-1.5 MG/5ML PO SYRP: 5.0000 mL | ORAL_SOLUTION | Freq: Three times a day (TID) | ORAL | Status: DC | PRN

## 2015-01-11 MED ORDER — PREDNISONE 20 MG PO TABS: ORAL_TABLET | ORAL | Status: DC

## 2015-01-11 MED ORDER — AMOXICILLIN-POT CLAVULANATE 875-125 MG PO TABS: 1.0000 | ORAL_TABLET | Freq: Two times a day (BID) | ORAL | Status: DC

## 2015-01-11 NOTE — Progress Notes (Signed)
Subjective:  This chart was scribed for Elvina Sidle MD, by Veverly Fells, at [facility].  This patient was seen in room 4 and the patient's care was started at 9:09 AM.    Patient ID: Kim Underwood, female    DOB: [birth year], 49 y.o.   MRN: [id number] Chief Complaint  Patient presents with   Sinusitis    when pt blows her nose she see's blood -  started x 5 days ago   Cough    HPI   HPI Comments: Kim Underwood is a 49 y.o. female who presents to the [facility] complaining of naso congestion and a cough with wheezing onset  5 days ago.  Patient states that when she blows her nose, she has a small amount of blood coming out.  Her symptoms have been keeping her up at night.  She denies a fever.  She has never used an inhaler in the past and has never had sinus issues.  She states her last cold was 4-5 years ago. Patient has a history of CHF, cardiologist is Dr.Bensimone. Patient works at [employer] in on [address]. She has no other questions or concerns today.    Past Medical History  Diagnosis Date   Hypertension, malignant    NICM (nonischemic cardiomyopathy)     LHC (02/2013): Lmain: no sig. dz, LAD: luminal irregularities, LCx: luminal irregularities, co-dominant, RCA: luminal irregularities, rel small, co-dominant with LCx   Chronic systolic heart failure     a. NICM b. ECHO (11/2012): EF 15-20%, Diff HK, grade IV DD, mod MR, LA mildly dilated c. RHC (02/2013): RA: 3, RV: 35/7, PA: 32/11 (19), PCWP: 6, PA sat 69%, Fick CO/CI: 4.6 / 2.8 d. ECHO (12/2013): EF 35-40%, diff HK, grade I DD, trivial MR, LA mildly dialted, RV nl   Tobacco abuse    CHF (congestive heart failure)     Current Outpatient Prescriptions on File Prior to Visit  Medication Sig Dispense Refill   carvedilol (COREG) 12.5 MG tablet Take 1 tablet (12.5 mg total) by mouth 2 (two) times daily with a meal. Take 12.5 mg (1 tablet) in the morning  and 12.5  mg (1 tablet) in the evening. 60 tablet 6   ibuprofen (ADVIL,MOTRIN) 200 MG tablet Take 200 mg by mouth every 6 (six) hours as needed for pain.     isosorbide mononitrate (IMDUR) 30 MG 24 hr tablet Take 1 tablet (30 mg total) by mouth daily. 90 tablet 3   lisinopril (PRINIVIL,ZESTRIL) 40 MG tablet TAKE ONE TABLET BY MOUTH ONCE DAILY 30 tablet 6   spironolactone (ALDACTONE) 25 MG tablet Take 1 tablet (25 mg total) by mouth daily. 30 tablet 6   acetaminophen (TYLENOL) 325 MG tablet Take 650 mg by mouth every 4 (four) hours as needed for moderate pain.     hydrALAZINE (APRESOLINE) 25 MG tablet Take 1 tablet (25 mg total) by mouth 2 (two) times daily. (Patient not taking: Reported on 01/11/2015) 60 tablet 6   No current facility-administered medications on file prior to visit.    No Known Allergies   Review of Systems  Constitutional: Negative for fever and chills.  HENT: Positive for congestion.   Respiratory: Positive for cough and wheezing.   Gastrointestinal: Negative for nausea and vomiting.  Musculoskeletal: Negative for neck pain and neck stiffness.       Objective:   Physical Exam  HENT:  Nasal passages are narrowed.  Pulmonary/Chest: Effort normal. She has wheezes.  Bilateral wheezes.    Filed Vitals:   01/11/15 0847  BP: 115/80  Pulse: 65  Temp: 98 F (36.7 C)  TempSrc: Oral  Resp: 17  Height: 5\' 2"  (1.575 m)  Weight: 163 lb 6.4 oz (74.118 kg)  SpO2: 93%    Results for orders placed or performed during the hospital encounter of 12/24/14  Basic metabolic panel  Result Value Ref Range   Sodium 137 135 - 145 mmol/L   Potassium 3.8 3.5 - 5.1 mmol/L   Chloride 108 101 - 111 mmol/L   CO2 23 22 - 32 mmol/L   Glucose, Bld 134 (H) 70 - 99 mg/dL   BUN 16 6 - 20 mg/dL   Creatinine, Ser 6.75 0.44 - 1.00 mg/dL   Calcium 9.1 8.9 - 91.6 mg/dL   GFR calc non Af Amer >60 >60 mL/min   GFR calc Af Amer >60 >60 mL/min   Anion gap 6 5 - 15           Assessment & Plan:   This chart was scribed in my presence and reviewed by me personally.    ICD-9-CM ICD-10-CM   1. Acute maxillary sinusitis, recurrence not specified 461.0 J01.00 amoxicillin-clavulanate (AUGMENTIN) 875-125 MG per tablet     HYDROcodone-homatropine (HYCODAN) 5-1.5 MG/5ML syrup     predniSONE (DELTASONE) 20 MG tablet  2. Acute bronchitis, unspecified organism 466.0 J20.9 amoxicillin-clavulanate (AUGMENTIN) 875-125 MG per tablet     HYDROcodone-homatropine (HYCODAN) 5-1.5 MG/5ML syrup     predniSONE (DELTASONE) 20 MG tablet     Signed, Elvina Sidle, MD

## 2015-01-11 NOTE — Patient Instructions (Signed)
Use Afrin nasal spray (oxymetazoline) in one nostril each night for the next several nights so that you can sleep. You have a bad sinus and bronchial infection which should respond to the medications provided. If you're not feeling better by Sunday I would like you to come back so we can get some x-rays and reevaluate   Acute Bronchitis Bronchitis is inflammation of the airways that extend from the windpipe into the lungs (bronchi). The inflammation often causes mucus to develop. This leads to a cough, which is the most common symptom of bronchitis.  In acute bronchitis, the condition usually develops suddenly and goes away over time, usually in a couple weeks. Smoking, allergies, and asthma can make bronchitis worse. Repeated episodes of bronchitis may cause further lung problems.  CAUSES Acute bronchitis is most often caused by the same virus that causes a cold. The virus can spread from person to person (contagious) through coughing, sneezing, and touching contaminated objects. SIGNS AND SYMPTOMS   Cough.   Fever.   Coughing up mucus.   Body aches.   Chest congestion.   Chills.   Shortness of breath.   Sore throat.  DIAGNOSIS  Acute bronchitis is usually diagnosed through a physical exam. Your health care provider will also ask you questions about your medical history. Tests, such as chest X-rays, are sometimes done to rule out other conditions.  TREATMENT  Acute bronchitis usually goes away in a couple weeks. Oftentimes, no medical treatment is necessary. Medicines are sometimes given for relief of fever or cough. Antibiotic medicines are usually not needed but may be prescribed in certain situations. In some cases, an inhaler may be recommended to help reduce shortness of breath and control the cough. A cool mist vaporizer may also be used to help thin bronchial secretions and make it easier to clear the chest.  HOME CARE INSTRUCTIONS  Get plenty of rest.   Drink enough  fluids to keep your urine clear or pale yellow (unless you have a medical condition that requires fluid restriction). Increasing fluids may help thin your respiratory secretions (sputum) and reduce chest congestion, and it will prevent dehydration.   Take medicines only as directed by your health care provider.  If you were prescribed an antibiotic medicine, finish it all even if you start to feel better.  Avoid smoking and secondhand smoke. Exposure to cigarette smoke or irritating chemicals will make bronchitis worse. If you are a smoker, consider using nicotine gum or skin patches to help control withdrawal symptoms. Quitting smoking will help your lungs heal faster.   Reduce the chances of another bout of acute bronchitis by washing your hands frequently, avoiding people with cold symptoms, and trying not to touch your hands to your mouth, nose, or eyes.   Keep all follow-up visits as directed by your health care provider.  SEEK MEDICAL CARE IF: Your symptoms do not improve after 1 week of treatment.  SEEK IMMEDIATE MEDICAL CARE IF:  You develop an increased fever or chills.   You have chest pain.   You have severe shortness of breath.  You have bloody sputum.   You develop dehydration.  You faint or repeatedly feel like you are going to pass out.  You develop repeated vomiting.  You develop a severe headache. MAKE SURE YOU:   Understand these instructions.  Will watch your condition.  Will get help right away if you are not doing well or get worse. Document Released: 09/10/2004 Document Revised: 12/18/2013 Document Reviewed: 01/24/2013  [employer] Patient Information 2015 [place], [place]. This information is not intended to replace advice given to you by your health care provider. Make sure you discuss any questions you have with your health care provider. Sinusitis Sinusitis is redness, soreness, and inflammation of the paranasal sinuses. Paranasal sinuses are air  pockets within the bones of your face (beneath the eyes, the middle of the forehead, or above the eyes). In healthy paranasal sinuses, mucus is able to drain out, and air is able to circulate through them by way of your nose. However, when your paranasal sinuses are inflamed, mucus and air can become trapped. This can allow bacteria and other germs to grow and cause infection. Sinusitis can develop quickly and last only a short time (acute) or continue over a long period (chronic). Sinusitis that lasts for more than 12 weeks is considered chronic.  CAUSES  Causes of sinusitis include:  Allergies.  Structural abnormalities, such as displacement of the cartilage that separates your nostrils (deviated septum), which can decrease the air flow through your nose and sinuses and affect sinus drainage.  Functional abnormalities, such as when the small hairs (cilia) that line your sinuses and help remove mucus do not work properly or are not present. SIGNS AND SYMPTOMS  Symptoms of acute and chronic sinusitis are the same. The primary symptoms are pain and pressure around the affected sinuses. Other symptoms include:  Upper toothache.  Earache.  Headache.  Bad breath.  Decreased sense of smell and taste.  A cough, which worsens when you are lying flat.  Fatigue.  Fever.  Thick drainage from your nose, which often is green and may contain pus (purulent).  Swelling and warmth over the affected sinuses. DIAGNOSIS  Your health care provider will perform a physical exam. During the exam, your health care provider may:  Look in your nose for signs of abnormal growths in your nostrils (nasal polyps).  Tap over the affected sinus to check for signs of infection.  View the inside of your sinuses (endoscopy) using an imaging device that has a light attached (endoscope). If your health care provider suspects that you have chronic sinusitis, one or more of the following tests may be  recommended:  Allergy tests.  Nasal culture. A sample of mucus is taken from your nose, sent to a lab, and screened for bacteria.  Nasal cytology. A sample of mucus is taken from your nose and examined by your health care provider to determine if your sinusitis is related to an allergy. TREATMENT  Most cases of acute sinusitis are related to a viral infection and will resolve on their own within 10 days. Sometimes medicines are prescribed to help relieve symptoms (pain medicine, decongestants, nasal steroid sprays, or saline sprays).  However, for sinusitis related to a bacterial infection, your health care provider will prescribe antibiotic medicines. These are medicines that will help kill the bacteria causing the infection.  Rarely, sinusitis is caused by a fungal infection. In theses cases, your health care provider will prescribe antifungal medicine. For some cases of chronic sinusitis, surgery is needed. Generally, these are cases in which sinusitis recurs more than 3 times per year, despite other treatments. HOME CARE INSTRUCTIONS   Drink plenty of water. Water helps thin the mucus so your sinuses can drain more easily.  Use a humidifier.  Inhale steam 3 to 4 times a day (for example, sit in the bathroom with the shower running).  Apply a warm, moist washcloth to your face 3 to  4 times a day, or as directed by your health care provider.  Use saline nasal sprays to help moisten and clean your sinuses.  Take medicines only as directed by your health care provider.  If you were prescribed either an antibiotic or antifungal medicine, finish it all even if you start to feel better. SEEK IMMEDIATE MEDICAL CARE IF:  You have increasing pain or severe headaches.  You have nausea, vomiting, or drowsiness.  You have swelling around your face.  You have vision problems.  You have a stiff neck.  You have difficulty breathing. MAKE SURE YOU:   Understand these  instructions.  Will watch your condition.  Will get help right away if you are not doing well or get worse. Document Released: 08/03/2005 Document Revised: 12/18/2013 Document Reviewed: 08/18/2011 [facility] Patient Information 2015 [place], [place]. This information is not intended to replace advice given to you by your health care provider. Make sure you discuss any questions you have with your health care provider.

## 2015-01-21 ENCOUNTER — Other Ambulatory Visit (HOSPITAL_COMMUNITY): Payer: Self-pay | Admitting: Adult Health

## 2015-03-08 ENCOUNTER — Other Ambulatory Visit (HOSPITAL_COMMUNITY): Payer: Self-pay | Admitting: Adult Health

## 2015-03-08 ENCOUNTER — Other Ambulatory Visit (HOSPITAL_COMMUNITY): Payer: Self-pay | Admitting: Internal Medicine

## 2015-04-10 ENCOUNTER — Other Ambulatory Visit (HOSPITAL_COMMUNITY): Payer: Self-pay | Admitting: Adult Health

## 2015-05-11 ENCOUNTER — Other Ambulatory Visit (HOSPITAL_COMMUNITY): Payer: Self-pay | Admitting: Adult Health

## 2015-05-11 ENCOUNTER — Other Ambulatory Visit (HOSPITAL_COMMUNITY): Payer: Self-pay | Admitting: Anesthesiology

## 2015-05-28 ENCOUNTER — Ambulatory Visit (INDEPENDENT_AMBULATORY_CARE_PROVIDER_SITE_OTHER): Payer: Commercial Managed Care - PPO | Admitting: Emergency Medicine

## 2015-05-28 VITALS — BP 160/80 | HR 74 | Temp 98.7°F | Resp 16 | Ht 62.0 in | Wt 159.0 lb

## 2015-05-28 DIAGNOSIS — Z23 Encounter for immunization: Secondary | ICD-10-CM

## 2015-05-28 DIAGNOSIS — R1013 Epigastric pain: Secondary | ICD-10-CM

## 2015-05-28 DIAGNOSIS — R112 Nausea with vomiting, unspecified: Secondary | ICD-10-CM | POA: Diagnosis not present

## 2015-05-28 LAB — POCT CBC
Granulocyte percent: 57.5 %G (ref 37–80)
HCT, POC: 40.6 % (ref 37.7–47.9)
Hemoglobin: 13.9 g/dL (ref 12.2–16.2)
Lymph, poc: 3.9 — AB (ref 0.6–3.4)
MCH: 30.7 pg (ref 27–31.2)
MCHC: 34.2 g/dL (ref 31.8–35.4)
MCV: 89.7 fL (ref 80–97)
MID (CBC): 1 — AB (ref 0–0.9)
MPV: 5.4 fL (ref 0–99.8)
POC Granulocyte: 6.6 (ref 2–6.9)
POC LYMPH %: 33.8 % (ref 10–50)
POC MID %: 8.7 % (ref 0–12)
Platelet Count, POC: 409 10*3/uL (ref 142–424)
RBC: 4.53 M/uL (ref 4.04–5.48)
RDW, POC: 14.1 %
WBC: 11.5 10*3/uL — AB (ref 4.6–10.2)

## 2015-05-28 MED ORDER — LANSOPRAZOLE 30 MG PO CPDR: 30.0000 mg | DELAYED_RELEASE_CAPSULE | Freq: Every day | ORAL | Status: DC

## 2015-05-28 MED ORDER — ONDANSETRON 8 MG PO TBDP: 8.0000 mg | ORAL_TABLET | Freq: Three times a day (TID) | ORAL | Status: DC | PRN

## 2015-05-28 NOTE — Progress Notes (Signed)
Subjective:  Patient ID: Kim Underwood, female    DOB: 1966/06/06  Age: 49 y.o. MRN: [id number]  CC: Abdominal Pain; Gastrophageal Reflux; Emesis; and Can't eat anything   HPI Kim Underwood presents   Kim Underwood is concerned that Kim Underwood has food poisoning after eating donuts and Dr. Consuelo Pandy 5 days ago. Kim Underwood has no fever or chills. Kim Underwood's had no nausea vomiting today said no stool change. Kim Underwood's been taking Pepto-Bismol start Kim Underwood stools black. Yesterday Kim Underwood vomited 3 times Kim Underwood has some heartburn. Kim Underwood has no reflux symptoms. Kim Underwood has no no other complaints. Kim Underwood has no abdominal pain  History Kim Underwood has a past medical history of Hypertension, malignant; NICM (nonischemic cardiomyopathy) (HCC); Chronic systolic heart failure (HCC); Tobacco abuse; and CHF (congestive heart failure) (HCC).   Kim Underwood has past surgical history that includes Tubal ligation (2000).   Kim Underwood  family history includes Heart disease in Kim Underwood father and mother; Stroke in Kim Underwood mother.  Kim Underwood   reports that Kim Underwood has been smoking Cigarettes.  Kim Underwood has been smoking about 0.50 packs per day. Kim Underwood has never used smokeless tobacco. Kim Underwood reports that Kim Underwood drinks alcohol. Kim Underwood reports that Kim Underwood does not use illicit drugs.  Outpatient Prescriptions Prior to Visit  Medication Sig Dispense Refill  . acetaminophen (TYLENOL) 325 MG tablet Take 650 mg by mouth every 4 (four) hours as needed for moderate pain.    . carvedilol (COREG) 12.5 MG tablet Take 1 tablet (12.5 mg total) by mouth 2 (two) times daily with a meal. Take 12.5 mg (1 tablet) in the morning and 12.5  mg (1 tablet) in the evening. 60 tablet 6  . hydrALAZINE (APRESOLINE) 25 MG tablet TAKE ONE TABLET BY MOUTH TWICE DAILY 60 tablet 0  . isosorbide mononitrate (IMDUR) 30 MG 24 hr tablet Take 1 tablet (30 mg total) by mouth daily. 90 tablet 3  . lisinopril (PRINIVIL,ZESTRIL) 40 MG tablet TAKE ONE TABLET BY MOUTH ONCE DAILY 30 tablet 6  . spironolactone (ALDACTONE) 25 MG tablet Take 1 tablet  (25 mg total) by mouth daily. 30 tablet 6  . predniSONE (DELTASONE) 20 MG tablet Two daily with food 10 tablet 0  . ibuprofen (ADVIL,MOTRIN) 200 MG tablet Take 200 mg by mouth every 6 (six) hours as needed for pain.    Marland Kitchen amoxicillin-clavulanate (AUGMENTIN) 875-125 MG per tablet Take 1 tablet by mouth 2 (two) times daily. 20 tablet 0  . HYDROcodone-homatropine (HYCODAN) 5-1.5 MG/5ML syrup Take 5 mLs by mouth every 8 (eight) hours as needed for cough. 120 mL 0   No facility-administered medications prior to visit.    Social History   Social History  . Marital Status: Single    Spouse Name: N/A  . Number of Children: N/A  . Years of Education: N/A   Social History Main Topics  . Smoking status: Current Every Day Smoker -- 0.50 packs/day    Types: Cigarettes  . Smokeless tobacco: Never Used  . Alcohol Use: Yes     Comment: very rarely  . Drug Use: No  . Sexual Activity: Not Asked   Other Topics Concern  . None   Social History Narrative   Lives in [place]. Smokes cigarettes 1/2 ppd, social Etoh but not to excess. Works at [employer].      Review of Systems  Constitutional: Negative for fever, chills and appetite change.  HENT: Negative for congestion, ear pain, postnasal drip, sinus pressure and sore throat.   Eyes: Negative for pain and redness.  Respiratory: Negative for cough, shortness of breath and wheezing.   Cardiovascular: Negative for leg swelling.  Gastrointestinal: Positive for nausea and vomiting. Negative for abdominal pain, constipation and blood in stool.  Endocrine: Negative for polyuria.  Genitourinary: Negative for dysuria, urgency, frequency and flank pain.  Musculoskeletal: Negative for gait problem.  Skin: Negative for rash.  Neurological: Negative for weakness and headaches.  Psychiatric/Behavioral: Negative for confusion and decreased concentration. The patient is not nervous/anxious.     Objective:  BP 160/80 mmHg  Pulse 74   Temp(Src) 98.7 F (37.1 C) (Oral)  Resp 16  Ht  (1.575 m)  Wt 159 lb (72.122 kg)  BMI 29.07 kg/m2  SpO2 98%  LMP 11/16/2012  Physical Exam  Constitutional: Kim Underwood is oriented to person, place, and time. Kim Underwood appears well-developed and well-nourished. No distress.  HENT:  Head: Normocephalic and atraumatic.  Right Ear: External ear normal.  Left Ear: External ear normal.  Nose: Nose normal.  Eyes: Conjunctivae and EOM are normal. Pupils are equal, round, and reactive to light. No scleral icterus.  Neck: Normal range of motion. Neck supple. No tracheal deviation present.  Cardiovascular: Normal rate, regular rhythm and normal heart sounds.   Pulmonary/Chest: Effort normal. No respiratory distress. Kim Underwood has no wheezes. Kim Underwood has no rales.  Abdominal: Kim Underwood exhibits no mass. There is no tenderness. There is no rebound and no guarding.  Musculoskeletal: Kim Underwood exhibits no edema.  Lymphadenopathy:    Kim Underwood has no cervical adenopathy.  Neurological: Kim Underwood is alert and oriented to person, place, and time. Coordination normal.  Skin: Skin is warm and dry. No rash noted.  Psychiatric: Kim Underwood has a normal mood and affect. Kim Underwood behavior is normal.      Assessment & Plan:   Kim Underwood was seen today for abdominal pain, gastrophageal reflux, emesis and can't eat anything.  Diagnoses and all orders for this visit:  Abdominal pain, epigastric -     POCT CBC -     Flu Vaccine QUAD 36+ mos IM  Non-intractable vomiting with nausea, unspecified vomiting type -     POCT CBC -     Flu Vaccine QUAD 36+ mos IM  Need for prophylactic vaccination and inoculation against influenza -     POCT CBC -     Flu Vaccine QUAD 36+ mos IM  Other orders -     ondansetron (ZOFRAN-ODT) 8 MG disintegrating tablet; Take 1 tablet (8 mg total) by mouth every 8 (eight) hours as needed for nausea. -     lansoprazole (PREVACID) 30 MG capsule; Take 1 capsule (30 mg total) by mouth daily at 12 noon.   I have discontinued Ms.  Underwood's amoxicillin-clavulanate, HYDROcodone-homatropine, and predniSONE. I am also having Kim Underwood start on ondansetron and lansoprazole. Additionally, I am having Kim Underwood maintain Kim Underwood ibuprofen, acetaminophen, isosorbide mononitrate, spironolactone, carvedilol, lisinopril, and hydrALAZINE.  Meds ordered this encounter  Medications  . ondansetron (ZOFRAN-ODT) 8 MG disintegrating tablet    Sig: Take 1 tablet (8 mg total) by mouth every 8 (eight) hours as needed for nausea.    Dispense:  30 tablet    Refill:  0  . lansoprazole (PREVACID) 30 MG capsule    Sig: Take 1 capsule (30 mg total) by mouth daily at 12 noon.    Dispense:  30 capsule    Refill:  2    Appropriate red flag conditions were discussed with the patient as well as actions that should be taken.  Patient expressed his understanding.  Follow-up:  Return if symptoms worsen or fail to improve.  Carmelina Dane, MD   Results for orders placed or performed in visit on 05/28/15  POCT CBC  Result Value Ref Range   WBC 11.5 (A) 4.6 - 10.2 K/uL   Lymph, poc 3.9 (A) 0.6 - 3.4   POC LYMPH PERCENT 33.8 10 - 50 %L   MID (cbc) 1.0 (A) 0 - 0.9   POC MID % 8.7 0 - 12 %M   POC Granulocyte 6.6 2 - 6.9   Granulocyte percent 57.5 37 - 80 %G   RBC 4.53 4.04 - 5.48 M/uL   Hemoglobin 13.9 12.2 - 16.2 g/dL   HCT, POC 16.1 09.6 - 47.9 %   MCV 89.7 80 - 97 fL   MCH, POC 30.7 27 - 31.2 pg   MCHC 34.2 31.8 - 35.4 g/dL   RDW, POC 04.5 %   Platelet Count, POC 409 142 - 424 K/uL   MPV 5.4 0 - 99.8 fL

## 2015-05-28 NOTE — Patient Instructions (Signed)

## 2015-06-13 ENCOUNTER — Other Ambulatory Visit (HOSPITAL_COMMUNITY): Payer: Self-pay | Admitting: Adult Health

## 2015-06-25 ENCOUNTER — Ambulatory Visit (HOSPITAL_COMMUNITY)
Admission: RE | Admit: 2015-06-25 | Discharge: 2015-06-25 | Disposition: A | Discharge status: Home / Self Care | Payer: Commercial Managed Care - PPO | Source: Ambulatory Visit | Attending: Internal Medicine | Admitting: Internal Medicine

## 2015-06-25 ENCOUNTER — Ambulatory Visit (HOSPITAL_BASED_OUTPATIENT_CLINIC_OR_DEPARTMENT_OTHER)
Admission: RE | Admit: 2015-06-25 | Discharge: 2015-06-25 | Disposition: A | Discharge status: Home / Self Care | Payer: Commercial Managed Care - PPO | Source: Ambulatory Visit | Attending: Adult Health | Admitting: Adult Health

## 2015-06-25 VITALS — BP 120/72 | HR 64 | Wt 160.6 lb

## 2015-06-25 DIAGNOSIS — Z720 Tobacco use: Secondary | ICD-10-CM | POA: Diagnosis not present

## 2015-06-25 DIAGNOSIS — I159 Secondary hypertension, unspecified: Secondary | ICD-10-CM

## 2015-06-25 DIAGNOSIS — I5022 Chronic systolic (congestive) heart failure: Secondary | ICD-10-CM | POA: Insufficient documentation

## 2015-06-25 DIAGNOSIS — I110 Hypertensive heart disease with heart failure: Secondary | ICD-10-CM | POA: Insufficient documentation

## 2015-06-25 DIAGNOSIS — F17210 Nicotine dependence, cigarettes, uncomplicated: Secondary | ICD-10-CM | POA: Diagnosis not present

## 2015-06-25 DIAGNOSIS — F17200 Nicotine dependence, unspecified, uncomplicated: Secondary | ICD-10-CM

## 2015-06-25 LAB — BASIC METABOLIC PANEL
Anion gap: 8 (ref 5–15)
BUN: 11 mg/dL (ref 6–20)
CALCIUM: 9.3 mg/dL (ref 8.9–10.3)
CO2: 25 mmol/L (ref 22–32)
CREATININE: 0.69 mg/dL (ref 0.44–1.00)
Chloride: 105 mmol/L (ref 101–111)
GLUCOSE: 135 mg/dL — AB (ref 65–99)
Potassium: 3.3 mmol/L — ABNORMAL LOW (ref 3.5–5.1)
Sodium: 138 mmol/L (ref 135–145)

## 2015-06-25 MED ORDER — HYDRALAZINE HCL 25 MG PO TABS
25.0000 mg | ORAL_TABLET | Freq: Two times a day (BID) | ORAL | Status: DC
Start: 2015-06-25 — End: 2017-07-23

## 2015-06-25 MED ORDER — ISOSORBIDE MONONITRATE ER 30 MG PO TB24: 30.0000 mg | ORAL_TABLET | Freq: Every day | ORAL | Status: DC

## 2015-06-25 MED ORDER — BUPROPION HCL ER (SR) 150 MG PO TB12: 150.0000 mg | ORAL_TABLET | Freq: Two times a day (BID) | ORAL | Status: DC

## 2015-06-25 NOTE — Progress Notes (Signed)
  Echocardiogram 2D Echocardiogram has been performed.  Kim Underwood, [name] 06/25/2015, 1:14 PM

## 2015-06-25 NOTE — Patient Instructions (Addendum)
Medication:  Wellbutrin 150 mg daily for 3 days then 150 mg twice a day  Labs:   Labs today will call if abnormal     Procedures:  Echocardiogram  Follow up:  3 months

## 2015-06-25 NOTE — Progress Notes (Signed)
Patient ID: Kim Underwood, female   DOB: 13-Dec-1965, 49 y.o.   MRN: [id number]    PCP: Dr Shela Commons. [place]   HPI:  Kim Underwood is a 49 year with a history of NICM, chronic systolic heart failure, HTN, and tobacco abuse.  Initially evaluated by Dr Excell Seltzer 12/21/12 . Plan for cardiac MRI but this was denied by insurance.  Follow up for Heart Failure:  Last visit carvedilol was switched to 12.5 mg twice a day. Overall doing ok.  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 Able to walk up steps. Denies SOB, orthopnea, PND/CP . Smoking 8 cigarettes per day. Wants to quit. Taking all medications.Following a low salt diet and drinking more than 2L a day.   02/13/13 Creatinine 0.82 Potassium 3.7 SPEP not detected  02/13/13 HIV non reactive  03/03/13 Creatinine 0.82 K 3.7  05/18/13 K+ 3.6, creatinine 0.59 06/14/13 K 4.2 Creatinine 0.7  TSH 0.18 08/03/13 K 3.8 creatinine 0.6  12/14/13 K 3.8 Creatinine 0.57 04/13/2014: K 3.9 Creatinine 0.62  12/2014: K 3.8 Creatinine 0.71     ECHO       12/14/12 EF 15%      06/08/13: EF 30-35% with global hypokinesis, normal RV, mild MR, grade I DD       May 2015: EF 35-40%   SH: Lives alone. Current smoker . Works full time at [employer]  FH: Both her parents had heart failure. Mom had stents  ROS: All systems negative except as listed in HPI, PMH and Problem List.  Past Medical History  Diagnosis Date  . Hypertension, malignant   . NICM (nonischemic cardiomyopathy) (HCC)     LHC (02/2013): Lmain: no sig. dz, LAD: luminal irregularities, LCx: luminal irregularities, co-dominant, RCA: luminal irregularities, rel small, co-dominant with LCx  . Chronic systolic heart failure (HCC)     a. NICM b. ECHO (11/2012): EF 15-20%, Diff HK, grade IV DD, mod MR, LA mildly dilated c. RHC (02/2013): RA: 3, RV: 35/7, PA: 32/11 (19), PCWP: 6, PA sat 69%, Fick CO/CI: 4.6 / 2.8 d. ECHO (12/2013): EF 35-40%, diff HK, grade I DD, trivial MR, LA mildly dialted, RV nl  . Tobacco abuse   . CHF  (congestive heart failure) (HCC)     Current Outpatient Prescriptions  Medication Sig Dispense Refill  . acetaminophen (TYLENOL) 325 MG tablet Take 650 mg by mouth every 4 (four) hours as needed for moderate pain.    . carvedilol (COREG) 12.5 MG tablet Take 12.5 mg by mouth 2 (two) times daily with a meal.    . hydrALAZINE (APRESOLINE) 25 MG tablet TAKE ONE TABLET BY MOUTH TWICE DAILY 60 tablet 0  . ibuprofen (ADVIL,MOTRIN) 200 MG tablet Take 200 mg by mouth every 6 (six) hours as needed for pain.    . isosorbide mononitrate (IMDUR) 30 MG 24 hr tablet Take 1 tablet (30 mg total) by mouth daily. 90 tablet 3  . lansoprazole (PREVACID) 30 MG capsule Take 1 capsule (30 mg total) by mouth daily at 12 noon. 30 capsule 2  . lisinopril (PRINIVIL,ZESTRIL) 40 MG tablet TAKE ONE TABLET BY MOUTH ONCE DAILY 30 tablet 6  . spironolactone (ALDACTONE) 25 MG tablet Take 1 tablet (25 mg total) by mouth daily. 30 tablet 6   No current facility-administered medications for this encounter.    Filed Vitals:   06/25/15 1034  BP: 120/72  Pulse: 64  Weight: 160 lb 9.6 oz (72.848 kg)  SpO2: 100%   PHYSICAL EXAM:  General:  Well appearing. No resp difficulty. Ambulated in the clinic without difficulty. HEENT: normal Neck: supple. JVP ~5-6. Carotids 2+ bilaterally; no bruits. No lymphadenopathy or thryomegaly appreciated. Cor: PMI normal. Regular rate & rhythm. No rubs, gallops or murmurs. Lungs: clear Abdomen: soft, nontender, nondistended. No hepatosplenomegaly. No bruits or masses. Good bowel sounds. Extremities: no cyanosis, clubbing, rash, edema Neuro: alert & orientedx3, cranial nerves grossly intact. Moves all 4 extremities w/o difficulty. Affect pleasant.  ASSESSMENT & PLAN:  1. Chronic systolic HF: NICM, ? Due to HTN.  ECHO 2015 EF 35-40%  - NYHA I symptoms and volume status stable. She is not on any diuretics. Instructed to call if weight starts trending up.  - Continue  coreg to 12.5 mg twice a  day.    -Continue hydralazine 25 mg BID and Imdur to 30 mg daily.  - On goal lisinopril to 40 mg daily - Check BMET/ECHO if EF down can start entresto.  -Continue sprio 25 mg daily. -BMET today.   - Reinforced the need and importance of daily weights, a low sodium diet, and fluid restriction (less than 2 L a day). Instructed to call the HF clinic if weight increases more than 3 lbs overnight or 5 lbs in a week.  2. HTN- stable.    3. Smoking: Contines to smoke about 7-8 cigarettes per day. Wants to quit. Tired nicoderm and chantix but didn't work. Discussed with HF Pharmacist.  Start wellbutrin 150 mg daily for 3 days then 150 mg twice a day. Provided written information wellbutrin.   Check ECHO today.    F/U 3 months  Amy Clegg NP-C 10:52 AM

## 2015-06-25 NOTE — Progress Notes (Signed)
Advanced Heart Failure Medication Review by a Pharmacist  Does the patient  feel that his/her medications are working for him/her?  yes  Has the patient been experiencing any side effects to the medications prescribed?  no  Does the patient measure his/her own blood pressure or blood glucose at home?  no   Does the patient have any problems obtaining medications due to transportation or finances?   no  Understanding of regimen: good Understanding of indications: good Potential of compliance: fair Patient understands to avoid NSAIDs. Patient understands to avoid decongestants.  Issues to address at subsequent visits: Compliance   Pharmacist comments:  Kim Underwood is a pleasant 48 yo F presenting with her medication bottles. She admits to fair compliance with her medications, stating that she misses 1-2 days/week of her medications. We did review the risks associated with NSAID use and she verbalized understanding. She did not have any specific medication-related questions or concerns for me at this time.   Tyler Deis. Bonnye Fava, PharmD, BCPS, CPP Clinical Pharmacist Pager: [phone] Phone: [phone] 06/25/2015 10:53 AM      Time with patient: 6 minutes Preparation and documentation time: 2 minutes Total time: 8 minutes

## 2015-06-27 NOTE — Progress Notes (Signed)
Spoke with pt she said she did not take any medication on 11/9 but is currently taking her medication as prescribed.

## 2015-07-02 ENCOUNTER — Telehealth (HOSPITAL_COMMUNITY): Payer: Self-pay | Admitting: Adult Health

## 2015-07-02 NOTE — Telephone Encounter (Signed)
   Called her and provided with ECHO results . EF recovered!!  Continue medications. Follow up in 6 months  Amy Clegg NP-C  3:05 PM

## 2015-07-15 ENCOUNTER — Other Ambulatory Visit (HOSPITAL_COMMUNITY): Payer: Self-pay | Admitting: Internal Medicine

## 2015-07-15 ENCOUNTER — Other Ambulatory Visit (HOSPITAL_COMMUNITY): Payer: Self-pay | Admitting: Adult Health

## 2015-09-03 ENCOUNTER — Ambulatory Visit (INDEPENDENT_AMBULATORY_CARE_PROVIDER_SITE_OTHER): Payer: Commercial Managed Care - PPO

## 2015-09-03 ENCOUNTER — Telehealth: Payer: Self-pay

## 2015-09-03 ENCOUNTER — Ambulatory Visit (INDEPENDENT_AMBULATORY_CARE_PROVIDER_SITE_OTHER): Payer: Commercial Managed Care - PPO | Admitting: Family Medicine

## 2015-09-03 VITALS — BP 114/76 | HR 73 | Temp 98.0°F | Resp 16 | Ht 60.5 in | Wt 157.4 lb

## 2015-09-03 DIAGNOSIS — R2241 Localized swelling, mass and lump, right lower limb: Secondary | ICD-10-CM

## 2015-09-03 DIAGNOSIS — R059 Cough, unspecified: Secondary | ICD-10-CM

## 2015-09-03 DIAGNOSIS — Z8679 Personal history of other diseases of the circulatory system: Secondary | ICD-10-CM | POA: Diagnosis not present

## 2015-09-03 DIAGNOSIS — R05 Cough: Secondary | ICD-10-CM

## 2015-09-03 MED ORDER — BENZONATATE 100 MG PO CAPS: 100.0000 mg | ORAL_CAPSULE | Freq: Three times a day (TID) | ORAL | Status: DC | PRN

## 2015-09-03 NOTE — Progress Notes (Signed)
[facility] [address], [place] [address] [id number]  Date:  09/03/2015   Name:  Kim Underwood   DOB:  1966/01/23   MRN:  [id number]  PCP:  Nestor Ramp    Chief Complaint: Cough and Foot Pain   History of Present Illness:  Kim Underwood is a 50 y.o. very pleasant female patient who presents with the following:  History of CHF; however her most recent echo in November 2016 was normal!  Here today with a cough- she has noted this for about 5 days.   The cough can be productive of some clear to yellow phlegm.   No fever or chills. She has noted some sinus congestion.  However overall she does not feel very ill No LE edema.  Her weight is stable No orthopnea or PND  She has noted a bump on the dorsum of her right foot- she noticed this just a few days ago.  It hurts to stand, walk and wear shoes. The bump is tender.  She is not aware of any injury or other cause.    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 Smoker 03/16/2013  . Chronic systolic CHF (congestive heart failure) (HCC) 02/13/2013  . Acute on chronic combined systolic and diastolic heart failure (HCC) 12/14/2012  . Hypertension 09/21/2011    Past Medical History  Diagnosis Date  . Hypertension, malignant   . NICM (nonischemic cardiomyopathy) (HCC)     LHC (02/2013): Lmain: no sig. dz, LAD: luminal irregularities, LCx: luminal irregularities, co-dominant, RCA: luminal irregularities, rel small, co-dominant with LCx  . Chronic systolic heart failure (HCC)     a. NICM b. ECHO (11/2012): EF 15-20%, Diff HK, grade IV DD, mod MR, LA mildly  dilated c. RHC (02/2013): RA: 3, RV: 35/7, PA: 32/11 (19), PCWP: 6, PA sat 69%, Fick CO/CI: 4.6 / 2.8 d. ECHO (12/2013): EF 35-40%, diff HK, grade I DD, trivial MR, LA mildly dialted, RV nl  . Tobacco abuse   . CHF (congestive heart failure) [facility])     Past Surgical History  Procedure Laterality Date  . Tubal ligation  2000    Social History  Substance Use Topics  . Smoking status: Current Every Day Smoker -- 0.50 packs/day    Types: Cigarettes  . Smokeless tobacco: Never Used  . Alcohol Use: Yes     Comment: very rarely    Family History  Problem Relation Age of Onset  . Stroke Mother   . Heart disease Mother   . Heart disease Father     No Known Allergies  Medication list has been reviewed and updated.  Current Outpatient Prescriptions on File Prior to Visit  Medication Sig Dispense Refill  . acetaminophen (TYLENOL) 325 MG tablet Take 650 mg by mouth every 4 (four) hours as needed for moderate pain.    . carvedilol (COREG) 12.5 MG tablet Take 12.5 mg by mouth 2 (two) times daily with a meal.    . hydrALAZINE (APRESOLINE) 25 MG tablet Take 1 tablet (25 mg total) by mouth 2 (  two) times daily. 60 tablet 0  . ibuprofen (ADVIL,MOTRIN) 200 MG tablet Take 200 mg by mouth every 6 (six) hours as needed for pain.    . isosorbide mononitrate (IMDUR) 30 MG 24 hr tablet Take 1 tablet (30 mg total) by mouth daily. 90 tablet 6  . lisinopril (PRINIVIL,ZESTRIL) 40 MG tablet TAKE ONE TABLET BY MOUTH ONCE DAILY 30 tablet 6  . spironolactone (ALDACTONE) 25 MG tablet TAKE ONE TABLET BY MOUTH ONCE DAILY 30 tablet 6  . buPROPion (WELLBUTRIN SR) 150 MG 12 hr tablet Take 1 tablet (150 mg total) by mouth 2 (two) times daily. (Patient not taking: Reported on 09/03/2015) 90 tablet 3   No current facility-administered medications on file prior to visit.    Review of Systems:  As per HPI- otherwise negative.   Physical Examination: Filed Vitals:   09/03/15 0941  BP: 114/76  Pulse: 73  Temp: 98  F (36.7 C)  Resp: 16   Filed Vitals:   09/03/15 0941  Height: 5' 0.5" (1.537 m)  Weight: 157 lb 6.4 oz (71.396 kg)   Body mass index is 30.22 kg/(m^2). Ideal Body Weight: Weight in (lb) to have BMI = 25: 129.9  GEN: WDWN, NAD, Non-toxic, A & O x 3, overweight, looks well HEENT: Atraumatic, Normocephalic. Neck supple. No masses, No LAD.  Bilateral TM wnl, oropharynx normal.  PEERL,EOMI.   Ears and Nose: No external deformity. CV: RRR, No M/G/R. No JVD. No thrill. No extra heart sounds. PULM: CTA B, no wheezes, crackles, rhonchi. No retractions. No resp. distress. No accessory muscle use. ABD: S, NT, ND, +BS. No rebound. No HSM. EXTR: No c/c/e NEURO Normal gait.  PSYCH: Normally interactive. Conversant. Not depressed or anxious appearing.  Calm demeanor.  Right foot: there is a firm swelling over the dorsum of the foot, it is tender.  The size of a small grape.  Not warm or red. not fluctuant. Located over the 2nd MT   [facility] reading (PRIMARY) by  Dr. Patsy Lager. Right foot: negative except soft tissue swelling on dorsum of foot is visible on the lateral view  [id number].  No change from previous appreciated.    CHEST 2 VIEW  COMPARISON: 12/12/2012  FINDINGS: Chronic mild cardiomegaly. Negative aortic and hilar contours. There is no edema, consolidation, effusion, or pneumothorax.  IMPRESSION: No active cardiopulmonary disease.  RIGHT FOOT COMPLETE - 3+ VIEW  COMPARISON: None.  FINDINGS: Three views of the right foot submitted. No acute fracture or subluxation. There is soft tissue swelling dorsal tarsal region. Tiny plantar spur of calcaneus. Small posterior spur of calcaneus. No periosteal reaction or bony erosion.  IMPRESSION: No acute fracture or subluxation. Soft tissue swelling dorsal tarsal region.   Assessment and Plan: Foot mass, right - Plan: DG Foot Complete Right, AMB referral to orthopedics  Cough - Plan: DG Chest 2 View, benzonatate (TESSALON) 100  MG capsule  History of CHF (congestive heart failure) - Plan: DG Chest 2 View  appt made with ortho for tomorrow to look at her foot.  She seems to have a cyst which is likely benign but makes it hard for her to work or wear shoes No evidence of current CHF She likely has a viral cough- will use tessalon as needed  She will let me know if cough not better soon  Signed Abbe Amsterdam, MD

## 2015-09-03 NOTE — Telephone Encounter (Signed)
Called pt to let her know I have scheduled her an appointment to see Dr. Althea Charon at [facility] on 09/04/15 at 2:00pm. Advised pt to arrive 15-20 minutes early.

## 2015-09-03 NOTE — Patient Instructions (Signed)
Your lungs are clear.  I think that your cough is due to a viral illness- a cold.   For the time being use the tessalon perles as needed for cough- you can also use OTC cough syrups If the cough does not go away in 10 days or so or if you are having any other symptoms please let me know We will refer you urgently to orthopedics to look at your foot. It appears that you have a cyst or some other soft tissue structure on your foot We will arrange this and give you a call asap

## 2015-09-06 ENCOUNTER — Encounter: Payer: Self-pay | Admitting: Family Medicine

## 2015-09-11 ENCOUNTER — Encounter: Payer: Self-pay | Admitting: Family Medicine

## 2015-09-16 ENCOUNTER — Telehealth (HOSPITAL_COMMUNITY): Payer: Self-pay

## 2015-09-16 NOTE — Telephone Encounter (Signed)
Called patient left message to let her know her appointment time and date in February

## 2015-09-18 ENCOUNTER — Other Ambulatory Visit (HOSPITAL_COMMUNITY): Payer: Self-pay | Admitting: Adult Health

## 2015-09-25 NOTE — Progress Notes (Signed)
Patient ID: Kim Underwood, female   DOB: 1965/09/14, 50 y.o.   MRN: [id number]    Advanced Heart Failure Clinic Note   PCP: Dr Shela Commons. [place]   HPI:  Kim Underwood is a 7 year with a history of NICM, chronic systolic heart failure, HTN, and tobacco abuse.  Initially evaluated by Dr Excell Seltzer 12/21/12 . Plan for cardiac MRI but this was denied by insurance.  Follow up for Heart Failure:  Echo at last visit with improved EF (Now 55-60%). Still working hotel full time. Overall feeling well. SBPs at home 110s.  No SOB on flat ground or stairs.  And you 4 stories 4-6 times a day.  No orthopnea, PND/CP. Still smoking 3-4 cigarettes a day. Plans on starting wellbutrin this Sunday.  Does have a dry cough. Works with chemicals every day at work. all medications.Following a low salt diet and drinking more than 2L a day.   02/13/13 Creatinine 0.82 Potassium 3.7 SPEP not detected  02/13/13 HIV non reactive  03/03/13 Creatinine 0.82 K 3.7  05/18/13 K+ 3.6, creatinine 0.59 06/14/13 K 4.2 Creatinine 0.7  TSH 0.18 08/03/13 K 3.8 creatinine 0.6  12/14/13 K 3.8 Creatinine 0.57 04/13/2014: K 3.9 Creatinine 0.62  12/2014: K 3.8 Creatinine 0.71  06/2015: K 3.3, Creatinine 0.69  ECHO  12/14/12 EF 15%  06/08/13: EF 30-35% with global hypokinesis, normal RV, mild MR, grade I DD May 2015: EF 35-40%  Echo 06/2015 EF 55-60%, mild MR, mod LAE, Mild TR, Normal RV  SH: Lives alone. Current smoker . Works full time at [employer]  FH: Both her parents had heart failure. Mom had stents  ROS: All systems negative except as listed in HPI, PMH and Problem List.  Past Medical History  Diagnosis Date  . Hypertension, malignant   . NICM (nonischemic cardiomyopathy) (HCC)     LHC (02/2013): Lmain: no sig. dz, LAD: luminal irregularities, LCx: luminal irregularities, co-dominant, RCA: luminal irregularities, rel small, co-dominant with LCx  . Chronic systolic heart failure (HCC)     a. NICM b. ECHO (11/2012): EF 15-20%, Diff HK,  grade IV DD, mod MR, LA mildly dilated c. RHC (02/2013): RA: 3, RV: 35/7, PA: 32/11 (19), PCWP: 6, PA sat 69%, Fick CO/CI: 4.6 / 2.8 d. ECHO (12/2013): EF 35-40%, diff HK, grade I DD, trivial MR, LA mildly dialted, RV nl  . Tobacco abuse   . CHF (congestive heart failure) (HCC)     Current Outpatient Prescriptions  Medication Sig Dispense Refill  . acetaminophen (TYLENOL) 325 MG tablet Take 650 mg by mouth every 4 (four) hours as needed for moderate pain.    . benzonatate (TESSALON) 100 MG capsule Take 1 capsule (100 mg total) by mouth 3 (three) times daily as needed for cough. 40 capsule 0  . carvedilol (COREG) 12.5 MG tablet Take 12.5 mg by mouth 2 (two) times daily with a meal.    . hydrALAZINE (APRESOLINE) 25 MG tablet Take 1 tablet (25 mg total) by mouth 2 (two) times daily. 60 tablet 0  . ibuprofen (ADVIL,MOTRIN) 200 MG tablet Take 200 mg by mouth every 6 (six) hours as needed for pain.    . isosorbide mononitrate (IMDUR) 30 MG 24 hr tablet Take 1 tablet (30 mg total) by mouth daily. 90 tablet 6  . lisinopril (PRINIVIL,ZESTRIL) 40 MG tablet TAKE ONE TABLET BY MOUTH ONCE DAILY 30 tablet 6  . spironolactone (ALDACTONE) 25 MG tablet TAKE ONE TABLET BY MOUTH ONCE DAILY 30 tablet 6  .  buPROPion (WELLBUTRIN SR) 150 MG 12 hr tablet Take 1 tablet (150 mg total) by mouth 2 (two) times daily. (Patient not taking: Reported on 09/03/2015) 90 tablet 3   No current facility-administered medications for this encounter.    Filed Vitals:   09/26/15 1501  BP: 104/76  Pulse: 68  Weight: 159 lb 3.2 oz (72.213 kg)  SpO2: 100%    Wt Readings from Last 3 Encounters:  09/26/15 159 lb 3.2 oz (72.213 kg)  09/03/15 157 lb 6.4 oz (71.396 kg)  06/25/15 160 lb 9.6 oz (72.848 kg)    PHYSICAL EXAM: General:  Well appearing. NAD,  HEENT: normal Neck: supple. JVP not elevated. Carotids 2+ bilaterally; no bruits. No thyromegaly or nodule noted.  Cor: PMI normal. RRR. No M/G/R Lungs: CTAB, normal  effort Abdomen: soft, NT, ND, no HSM. No bruits or masses. +BS  Extremities: no cyanosis, clubbing, rash, edema Neuro: alert & orientedx3, cranial nerves grossly intact. Moves all 4 extremities w/o difficulty. Affect pleasant.  ASSESSMENT & PLAN:  1. Chronic systolic HF: NICM, ? Due to HTN.  ECHO 2015 EF 35-40%  - NYHA I symptoms  - Volume status stable on exam. Not currently on any diuretics. Knows to call if weights starts trending up. (reviewed above). - Continue coreg 12.5 mg BID  -Continue hydralazine 25 mg BID and Imdur 30 mg daily.  - Continue lisinopril 40 mg daily  - Check BMET/BNP today.  - Continue sprio 25 mg daily. - Reinforced the need and importance of daily weights, a low sodium diet, and fluid restriction (less than 2 L a day). Instructed to call the HF clinic if weight increases more than 3 lbs overnight or 5 lbs in a week.  2. HTN-  - Stable on current meds. On softer side. No room to uptitrate meds, EF also improved on last check.   3. Smoking:  - Continues to smoke 3-4 cigarettes per day.  Starting wellbutrin on Sunday. Talking about peak effect around 10-14 days and that should be her goal time frame for complete cessation.   Doing very well. Labs today and follow up 6 months with Dr. Gala Romney.    Mariam Dollar Tillery PA-C 3:22 PM

## 2015-09-26 ENCOUNTER — Encounter (HOSPITAL_COMMUNITY): Payer: Commercial Managed Care - PPO

## 2015-09-26 ENCOUNTER — Ambulatory Visit (HOSPITAL_COMMUNITY)
Admission: RE | Admit: 2015-09-26 | Discharge: 2015-09-26 | Disposition: A | Discharge status: Home / Self Care | Payer: Commercial Managed Care - PPO | Source: Ambulatory Visit | Attending: Cardiology | Admitting: Cardiology

## 2015-09-26 ENCOUNTER — Encounter (HOSPITAL_COMMUNITY): Payer: Self-pay

## 2015-09-26 VITALS — BP 104/76 | HR 68 | Wt 159.2 lb

## 2015-09-26 DIAGNOSIS — F17210 Nicotine dependence, cigarettes, uncomplicated: Secondary | ICD-10-CM | POA: Diagnosis not present

## 2015-09-26 DIAGNOSIS — I5022 Chronic systolic (congestive) heart failure: Secondary | ICD-10-CM | POA: Insufficient documentation

## 2015-09-26 DIAGNOSIS — R05 Cough: Secondary | ICD-10-CM | POA: Diagnosis not present

## 2015-09-26 DIAGNOSIS — Z8249 Family history of ischemic heart disease and other diseases of the circulatory system: Secondary | ICD-10-CM | POA: Insufficient documentation

## 2015-09-26 DIAGNOSIS — I159 Secondary hypertension, unspecified: Secondary | ICD-10-CM | POA: Diagnosis not present

## 2015-09-26 DIAGNOSIS — I428 Other cardiomyopathies: Secondary | ICD-10-CM | POA: Insufficient documentation

## 2015-09-26 DIAGNOSIS — Z720 Tobacco use: Secondary | ICD-10-CM

## 2015-09-26 DIAGNOSIS — F17200 Nicotine dependence, unspecified, uncomplicated: Secondary | ICD-10-CM

## 2015-09-26 DIAGNOSIS — Z79899 Other long term (current) drug therapy: Secondary | ICD-10-CM | POA: Diagnosis not present

## 2015-09-26 DIAGNOSIS — I110 Hypertensive heart disease with heart failure: Secondary | ICD-10-CM | POA: Diagnosis not present

## 2015-09-26 LAB — BASIC METABOLIC PANEL
Anion gap: 10 (ref 5–15)
BUN: 15 mg/dL (ref 6–20)
CHLORIDE: 108 mmol/L (ref 101–111)
CO2: 24 mmol/L (ref 22–32)
CREATININE: 0.73 mg/dL (ref 0.44–1.00)
Calcium: 9.1 mg/dL (ref 8.9–10.3)
GFR calc non Af Amer: >60 mL/min (ref >60)
Glucose, Bld: 110 mg/dL — ABNORMAL HIGH (ref 65–99)
POTASSIUM: 3.1 mmol/L — AB (ref 3.5–5.1)
Sodium: 142 mmol/L (ref 135–145)

## 2015-09-26 LAB — BRAIN NATRIURETIC PEPTIDE: B NATRIURETIC PEPTIDE 5: 29.9 pg/mL (ref 0.0–100.0)

## 2015-09-26 MED ORDER — CARVEDILOL 12.5 MG PO TABS: 12.5000 mg | ORAL_TABLET | Freq: Two times a day (BID) | ORAL | Status: DC

## 2015-09-26 MED ORDER — LISINOPRIL 40 MG PO TABS: 40.0000 mg | ORAL_TABLET | Freq: Every day | ORAL | Status: DC

## 2015-09-26 NOTE — Patient Instructions (Signed)
Labs today  Your physician recommends that you schedule a follow-up appointment in: 6 months with Dr Gala Romney   Do the following things EVERYDAY: 1) Weigh yourself in the morning before breakfast. Write it down and keep it in a log. 2) Take your medicines as prescribed 3) Eat low salt foods-Limit salt (sodium) to 2000 mg per day.  4) Stay as active as you can everyday 5) Limit all fluids for the day to less than 2 liters 6)

## 2015-09-27 ENCOUNTER — Other Ambulatory Visit (HOSPITAL_COMMUNITY): Payer: Self-pay | Admitting: *Deleted

## 2015-09-27 MED ORDER — POTASSIUM CHLORIDE CRYS ER 20 MEQ PO TBCR: 20.0000 meq | EXTENDED_RELEASE_TABLET | Freq: Every day | ORAL | Status: DC

## 2015-09-30 ENCOUNTER — Other Ambulatory Visit (HOSPITAL_COMMUNITY): Payer: Self-pay

## 2015-09-30 MED ORDER — POTASSIUM CHLORIDE CRYS ER 20 MEQ PO TBCR: 20.0000 meq | EXTENDED_RELEASE_TABLET | Freq: Every day | ORAL | Status: DC

## 2015-10-04 ENCOUNTER — Other Ambulatory Visit (HOSPITAL_COMMUNITY): Payer: Commercial Managed Care - PPO

## 2015-10-11 ENCOUNTER — Other Ambulatory Visit (HOSPITAL_COMMUNITY): Payer: Commercial Managed Care - PPO

## 2015-10-21 ENCOUNTER — Other Ambulatory Visit (HOSPITAL_COMMUNITY): Payer: Commercial Managed Care - PPO

## 2015-11-04 ENCOUNTER — Ambulatory Visit (HOSPITAL_COMMUNITY)
Admission: RE | Admit: 2015-11-04 | Discharge: 2015-11-04 | Disposition: A | Discharge status: Home / Self Care | Payer: Commercial Managed Care - PPO | Source: Ambulatory Visit | Attending: Cardiology | Admitting: Cardiology

## 2015-11-04 DIAGNOSIS — I5022 Chronic systolic (congestive) heart failure: Secondary | ICD-10-CM | POA: Diagnosis not present

## 2015-11-04 LAB — BASIC METABOLIC PANEL
Anion gap: 8 (ref 5–15)
BUN: 12 mg/dL (ref 6–20)
CALCIUM: 9 mg/dL (ref 8.9–10.3)
CO2: 22 mmol/L (ref 22–32)
Chloride: 111 mmol/L (ref 101–111)
Creatinine, Ser: 0.71 mg/dL (ref 0.44–1.00)
GFR calc Af Amer: >60 mL/min (ref >60)
Glucose, Bld: 136 mg/dL — ABNORMAL HIGH (ref 65–99)
Potassium: 4.1 mmol/L (ref 3.5–5.1)
Sodium: 141 mmol/L (ref 135–145)

## 2015-11-12 ENCOUNTER — Telehealth (HOSPITAL_COMMUNITY): Payer: Self-pay

## 2015-11-12 NOTE — Telephone Encounter (Signed)
Lab results from last week's OV reviewed with patient.  Ave Filter

## 2016-01-23 ENCOUNTER — Other Ambulatory Visit (HOSPITAL_COMMUNITY): Payer: Self-pay | Admitting: Adult Health

## 2016-04-04 ENCOUNTER — Other Ambulatory Visit (HOSPITAL_COMMUNITY): Payer: Self-pay | Admitting: Internal Medicine

## 2016-04-04 ENCOUNTER — Other Ambulatory Visit (HOSPITAL_COMMUNITY): Payer: Self-pay | Admitting: Adult Health

## 2016-06-24 ENCOUNTER — Ambulatory Visit (HOSPITAL_COMMUNITY)
Admission: RE | Admit: 2016-06-24 | Discharge: 2016-06-24 | Disposition: A | Discharge status: Home / Self Care | Payer: Commercial Managed Care - PPO | Source: Ambulatory Visit | Attending: Cardiology | Admitting: Cardiology

## 2016-06-24 VITALS — BP 108/70 | HR 72 | Ht 60.0 in | Wt 160.8 lb

## 2016-06-24 DIAGNOSIS — I10 Essential (primary) hypertension: Secondary | ICD-10-CM | POA: Diagnosis not present

## 2016-06-24 DIAGNOSIS — Z79899 Other long term (current) drug therapy: Secondary | ICD-10-CM | POA: Insufficient documentation

## 2016-06-24 DIAGNOSIS — I5022 Chronic systolic (congestive) heart failure: Secondary | ICD-10-CM | POA: Diagnosis not present

## 2016-06-24 DIAGNOSIS — I429 Cardiomyopathy, unspecified: Secondary | ICD-10-CM | POA: Diagnosis not present

## 2016-06-24 DIAGNOSIS — F17210 Nicotine dependence, cigarettes, uncomplicated: Secondary | ICD-10-CM | POA: Insufficient documentation

## 2016-06-24 DIAGNOSIS — I110 Hypertensive heart disease with heart failure: Secondary | ICD-10-CM | POA: Insufficient documentation

## 2016-06-24 DIAGNOSIS — F17200 Nicotine dependence, unspecified, uncomplicated: Secondary | ICD-10-CM

## 2016-06-24 LAB — BASIC METABOLIC PANEL
Anion gap: 6 (ref 5–15)
BUN: 13 mg/dL (ref 6–20)
CO2: 22 mmol/L (ref 22–32)
Calcium: 9.5 mg/dL (ref 8.9–10.3)
Chloride: 109 mmol/L (ref 101–111)
Creatinine, Ser: 0.66 mg/dL (ref 0.44–1.00)
GFR calc Af Amer: >60 mL/min (ref >60)
GFR calc non Af Amer: >60 mL/min (ref >60)
GLUCOSE: 110 mg/dL — AB (ref 65–99)
POTASSIUM: 4.2 mmol/L (ref 3.5–5.1)
SODIUM: 137 mmol/L (ref 135–145)

## 2016-06-24 NOTE — Progress Notes (Signed)
Patient ID: Kim Underwood, female   DOB: 08/20/1965, 50 y.o.   MRN: [id number]    Advanced Heart Failure Clinic Note   PCP: Dr Shela CommonsJ. Copland  HF Cardiology: Dr Gala RomneyBensimhon.   HPI:  Kim Underwood is a 50 year with a history of NICM, chronic systolic heart failure, HTN, and tobacco abuse.  Initially evaluated by Dr Excell Seltzerooper 12/21/12 .   Follow up for Heart Failure: Overall feeling great. Denies SOB/PND/Orthopnea. Does admit to mild dyspnea with steps. Smoking 6 cigarettes per day. Taking all medications. Working full time in house keeping at a hotel.   02/13/13 Creatinine 0.82 Potassium 3.7 SPEP not detected  02/13/13 HIV non reactive  03/03/13 Creatinine 0.82 K 3.7  05/18/13 K+ 3.6, creatinine 0.59 06/14/13 K 4.2 Creatinine 0.7  TSH 0.18 08/03/13 K 3.8 creatinine 0.6  12/14/13 K 3.8 Creatinine 0.57 04/13/2014: K 3.9 Creatinine 0.62  12/2014: K 3.8 Creatinine 0.71  06/2015: K 3.3, Creatinine 0.69 10/2015: K 4.1 Creatinine 0.71   ECHO  12/14/12 EF 15%  06/08/13: EF 30-35% with global hypokinesis, normal RV, mild MR, grade I DD May 2015: EF 35-40%  Echo 06/2015 EF 55-60%, mild MR, mod LAE, Mild TR, Normal RV  SH: Lives alone. Current smoker . Works full time at [employer]  FH: Both her parents had heart failure. Mom had stents  ROS: All systems negative except as listed in HPI, PMH and Problem List.  Past Medical History:  Diagnosis Date  . CHF (congestive heart failure) (HCC)   . Chronic systolic heart failure (HCC)    a. NICM b. ECHO (11/2012): EF 15-20%, Diff HK, grade IV DD, mod MR, LA mildly dilated c. RHC (02/2013): RA: 3, RV: 35/7, PA: 32/11 (19), PCWP: 6, PA sat 69%, Fick CO/CI: 4.6 / 2.8 d. ECHO (12/2013): EF 35-40%, diff HK, grade I DD, trivial MR, LA mildly dialted, RV nl  . Hypertension, malignant   . NICM (nonischemic cardiomyopathy) (HCC)    LHC (02/2013): Lmain: no sig. dz, LAD: luminal irregularities, LCx: luminal irregularities, co-dominant, RCA: luminal irregularities, rel small,  co-dominant with LCx  . Tobacco abuse     Current Outpatient Prescriptions  Medication Sig Dispense Refill  . acetaminophen (TYLENOL) 325 MG tablet Take 650 mg by mouth every 4 (four) hours as needed for moderate pain.    . benzonatate (TESSALON) 100 MG capsule Take 1 capsule (100 mg total) by mouth 3 (three) times daily as needed for cough. 40 capsule 0  . buPROPion (WELLBUTRIN SR) 150 MG 12 hr tablet Take 1 tablet (150 mg total) by mouth 2 (two) times daily. (Patient not taking: Reported on 09/03/2015) 90 tablet 3  . carvedilol (COREG) 12.5 MG tablet Take 1 tablet (12.5 mg total) by mouth 2 (two) times daily with a meal. 60 tablet 6  . hydrALAZINE (APRESOLINE) 25 MG tablet Take 1 tablet (25 mg total) by mouth 2 (two) times daily. 60 tablet 0  . hydrALAZINE (APRESOLINE) 25 MG tablet TAKE ONE TABLET BY MOUTH TWICE DAILY 60 tablet 3  . ibuprofen (ADVIL,MOTRIN) 200 MG tablet Take 200 mg by mouth every 6 (six) hours as needed for pain.    . isosorbide mononitrate (IMDUR) 30 MG 24 hr tablet Take 1 tablet (30 mg total) by mouth daily. 90 tablet 6  . lisinopril (PRINIVIL,ZESTRIL) 40 MG tablet Take 1 tablet (40 mg total) by mouth daily. 30 tablet 6  . potassium chloride SA (K-DUR,KLOR-CON) 20 MEQ tablet Take 1 tablet (20 mEq total) by  mouth daily. 90 tablet 3  . spironolactone (ALDACTONE) 25 MG tablet TAKE ONE TABLET BY MOUTH ONCE DAILY 30 tablet 6   No current facility-administered medications for this encounter.     Vitals:   06/24/16 0905  BP: 108/70  BP Location: Left Arm  Patient Position: Sitting  Cuff Size: Normal  Pulse: 72  SpO2: 100%  Weight: 160 lb 12.8 oz (72.9 kg)  Height: 5' (1.524 m)    Wt Readings from Last 3 Encounters:  06/24/16 160 lb 12.8 oz (72.9 kg)  09/26/15 159 lb 3.2 oz (72.2 kg)  09/03/15 157 lb 6.4 oz (71.4 kg)    PHYSICAL EXAM: General:  Well appearing. NAD, Ambulated in the clinic without difficulty HEENT: normal Neck: supple. JVP not elevated. Carotids  2+ bilaterally; no bruits. No thyromegaly or nodule noted.  Cor: PMI normal. RRR. No M/G/R Lungs: CTAB, normal effort Abdomen: soft, NT, ND, no HSM. No bruits or masses. +BS  Extremities: no cyanosis, clubbing, rash, edema Neuro: alert & orientedx3, cranial nerves grossly intact. Moves all 4 extremities w/o difficulty. Affect pleasant.  ASSESSMENT & PLAN:  1. Chronic systolic HF: NICM, ? Due to HTN.  ECHO 2015 EF 35-40% >ECHO EF recovered 55-60%.  - NYHA II symptoms . - Volume status stable. Does not take diuretics. Stop potassium - Continue coreg 12.5 mg BID  -Continue hydralazine 25 mg BID and Imdur 30 mg daily.  - Continue lisinopril 40 mg daily  - Continue sprio 25 mg daily. - Repeat ECHO if EF ok will send general cardiology for yearly follow up.  - Reinforced the need and importance of daily weights, a low sodium diet, and fluid restriction (less than 2 L a day). Instructed to call the HF clinic if weight increases more than 3 lbs overnight or 5 lbs in a week.  2. HTN-  - Stable . Continue current regimen.    3. Smoking:  - Continues to smoke. Discussed smoking cessation. She declines.   Refer to HFSW for PCP.    Follow up in 1 year if EF ok refer to general cardiology.    Anique Beckley NP-C  9:11 AM

## 2016-06-24 NOTE — Patient Instructions (Signed)
Routine lab work today. Will notify you of abnormal results, otherwise no news is good news!  Will schedule you for an echocardiogram at [facility]. Address: [address] ([address]), [place], [place] [address]  Phone: [phone]  Follow up 1 year!  Do the following things EVERYDAY: 1) Weigh yourself in the morning before breakfast. Write it down and keep it in a log. 2) Take your medicines as prescribed 3) Eat low salt foods-Limit salt (sodium) to 2000 mg per day.  4) Stay as active as you can everyday 5) Limit all fluids for the day to less than 2 liters  

## 2016-06-24 NOTE — Progress Notes (Signed)
Advanced Heart Failure Medication Review by a Pharmacist  Does the patient  feel that his/her medications are working for him/her?  yes  Has the patient been experiencing any side effects to the medications prescribed?  no  Does the patient measure his/her own blood pressure or blood glucose at home?  no   Does the patient have any problems obtaining medications due to transportation or finances?   no  Understanding of regimen: good Understanding of indications: good Potential of compliance: good Patient understands to avoid NSAIDs. Patient understands to avoid decongestants.  Issues to address at subsequent visits: None   Pharmacist comments:  Kim Underwood is a pleasant 50 yo F presenting with her medication bottles. She reports good compliance with her regimen and did not have any specific medication-related questions or concerns for me at this time.   Tyler Deis. Bonnye Fava, PharmD, BCPS, CPP Clinical Pharmacist Pager: [phone] Phone: [phone] 06/24/2016 9:40 AM      Time with patient: 10 minutes Preparation and documentation time: 2 minutes Total time: 12 minutes

## 2016-06-29 ENCOUNTER — Telehealth: Payer: Self-pay | Admitting: Licensed Clinical Social Worker

## 2016-06-29 NOTE — Telephone Encounter (Signed)
CSW contacted patient to follow up on request for PCP. Patient reports she would like to continue with MD who moved out to [facility]. CSW provided patient with address and phone number to contact to arrange appointment per request. Patient grateful for the assistance. CSW available as needed. Lasandra Beech, LCSW [phone]

## 2016-07-03 ENCOUNTER — Other Ambulatory Visit (HOSPITAL_COMMUNITY): Payer: Self-pay | Admitting: Adult Health

## 2016-07-03 DIAGNOSIS — I5022 Chronic systolic (congestive) heart failure: Secondary | ICD-10-CM

## 2016-07-08 ENCOUNTER — Other Ambulatory Visit (HOSPITAL_COMMUNITY): Payer: Self-pay | Admitting: Student

## 2016-07-21 ENCOUNTER — Ambulatory Visit (HOSPITAL_COMMUNITY): Payer: Commercial Managed Care - PPO | Attending: Cardiology

## 2016-10-24 ENCOUNTER — Other Ambulatory Visit (HOSPITAL_COMMUNITY): Payer: Self-pay | Admitting: Adult Health

## 2016-10-24 ENCOUNTER — Other Ambulatory Visit (HOSPITAL_COMMUNITY): Payer: Self-pay | Admitting: Student

## 2016-11-26 ENCOUNTER — Telehealth (HOSPITAL_COMMUNITY): Payer: Self-pay | Admitting: *Deleted

## 2016-11-26 NOTE — Telephone Encounter (Signed)
Patient was ordered to have a Echo back in November.  Jasmine December from [facility] said that she had been trying to reach patient to schedule her Echo but has not been able to reach patient.  I called patient and spoke with her this morning and she stated that she had recently lost her job and just started a new job but does not have insurance yet.  Patient stated her insurance should kick in in the next 30 days.    I called Jasmine December back and she said she will make a note to contact patient first part of June to schedule her Echo. Patient is aware.

## 2016-12-04 ENCOUNTER — Telehealth (HOSPITAL_COMMUNITY): Payer: Self-pay | Admitting: Radiology

## 2016-12-04 NOTE — Telephone Encounter (Signed)
Call to schedule echocardiogram

## 2017-04-27 ENCOUNTER — Other Ambulatory Visit (HOSPITAL_COMMUNITY): Payer: Self-pay | Admitting: Internal Medicine

## 2017-06-08 IMAGING — CR DG FOOT COMPLETE 3+V*R*
3 series · 3 of 3 positions shown · non-contrast
Comparison: None.

CLINICAL DATA: Right foot dorsal pain for 3 days, no known injury

EXAM:
RIGHT FOOT COMPLETE - 3+ VIEW

[AP]
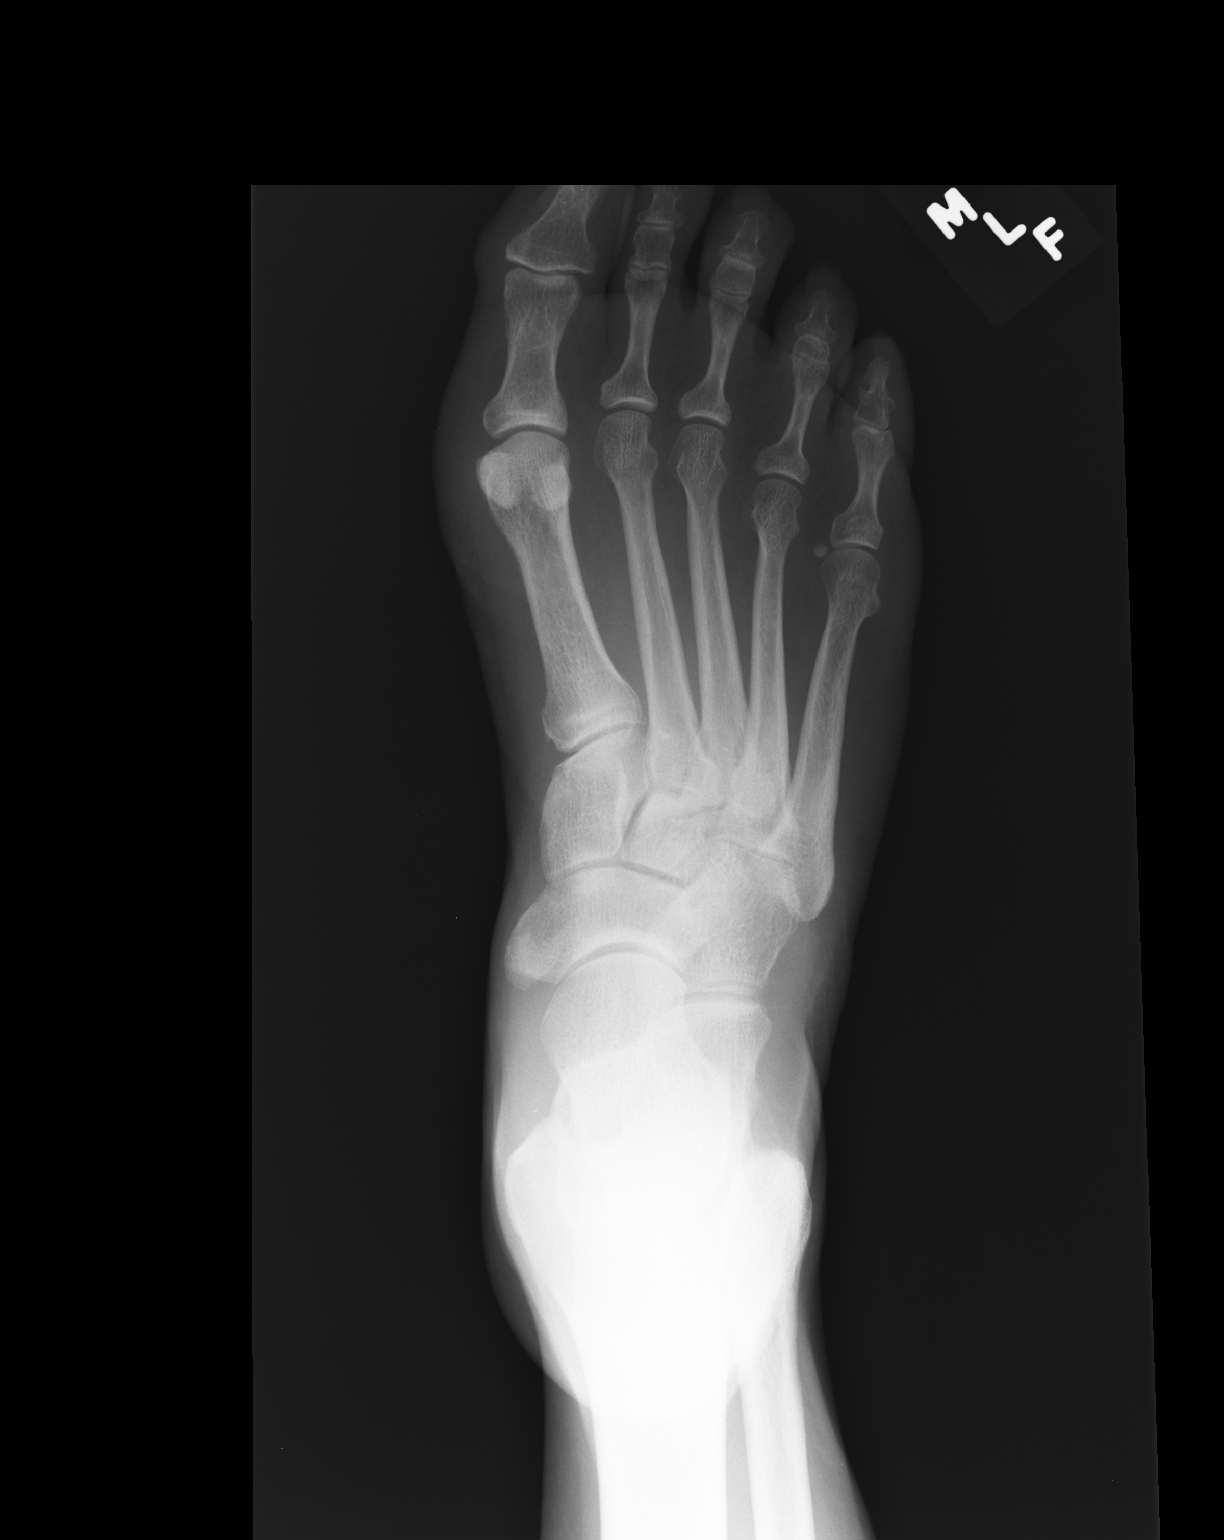

[ap obl int rot]
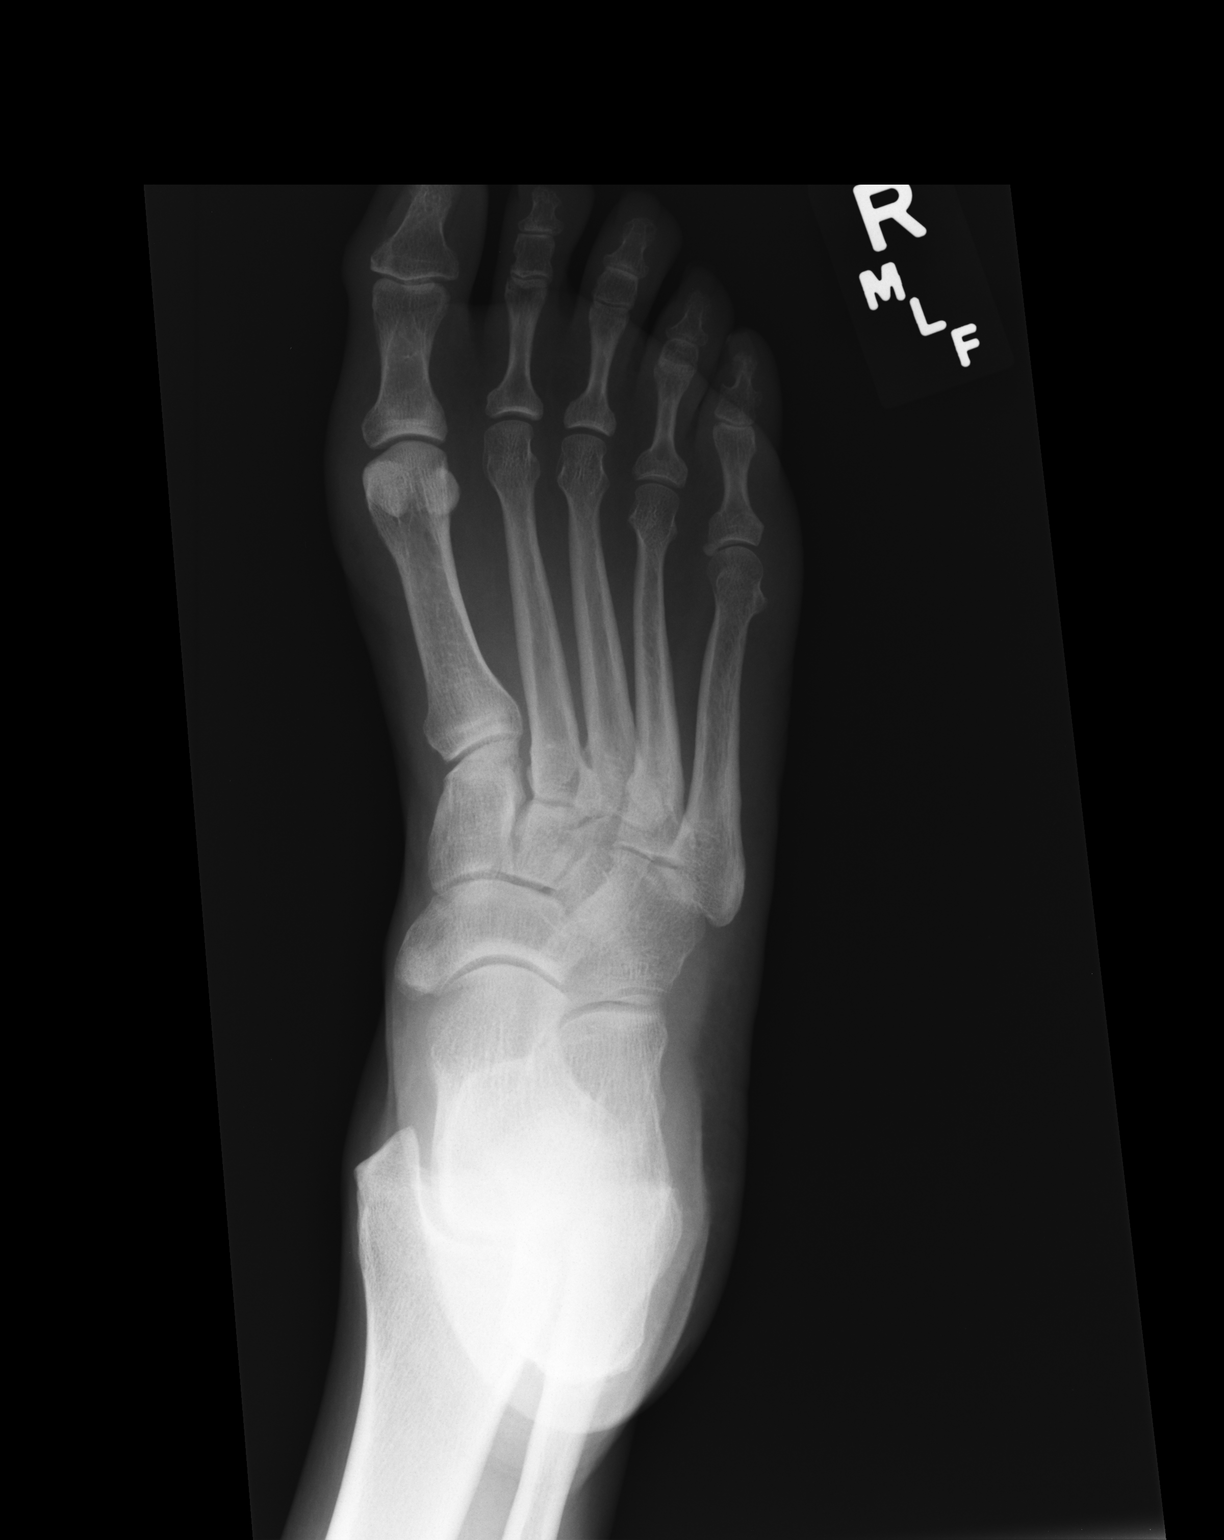

[lateral]
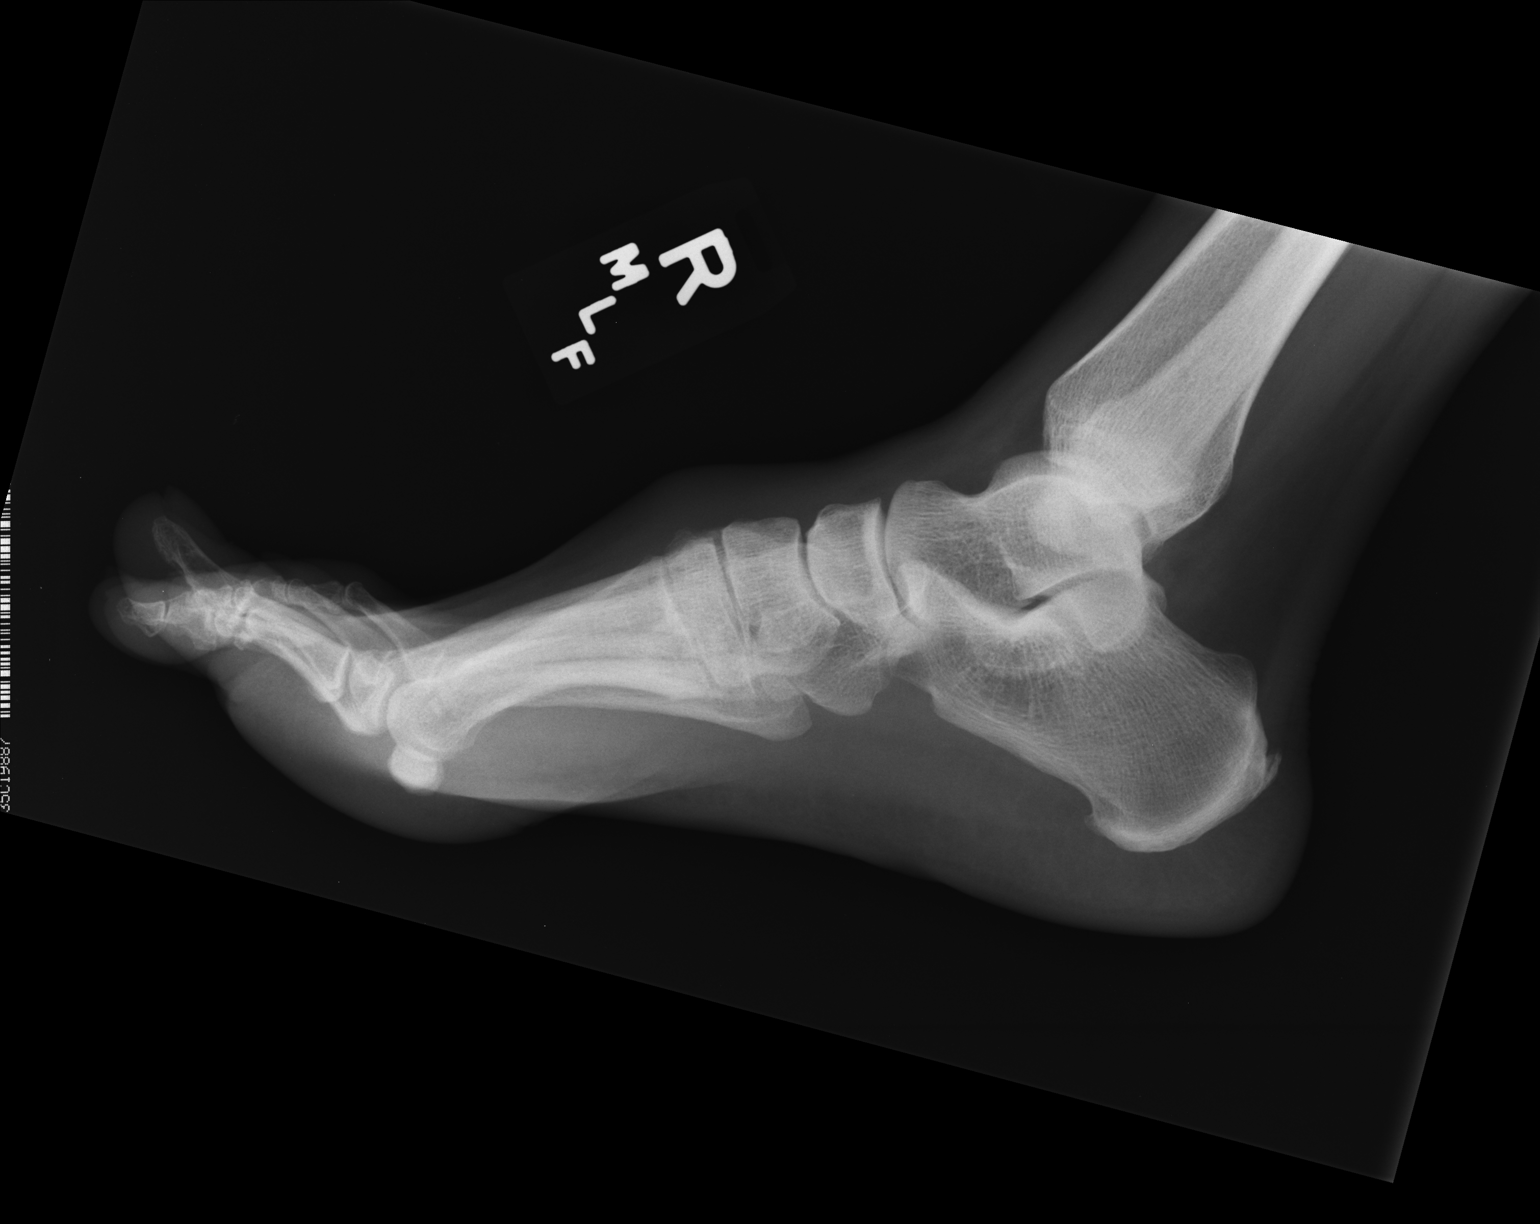

[3 of 3 positions shown; findings below may reference images not displayed]

FINDINGS: Three views of the right foot submitted. No acute fracture or
subluxation. There is soft tissue swelling dorsal tarsal region.
Tiny plantar spur of calcaneus. Small posterior spur of calcaneus.
No periosteal reaction or bony erosion.
IMPRESSION: No acute fracture or subluxation. Soft tissue swelling dorsal tarsal
region.

## 2017-07-05 ENCOUNTER — Other Ambulatory Visit (HOSPITAL_COMMUNITY): Payer: Self-pay | Admitting: Adult Health

## 2017-07-05 DIAGNOSIS — I5022 Chronic systolic (congestive) heart failure: Secondary | ICD-10-CM

## 2017-07-23 ENCOUNTER — Encounter (HOSPITAL_COMMUNITY): Payer: Self-pay

## 2017-07-23 ENCOUNTER — Ambulatory Visit (HOSPITAL_COMMUNITY)
Admission: RE | Admit: 2017-07-23 | Discharge: 2017-07-23 | Disposition: A | Discharge status: Home / Self Care | Payer: PRIVATE HEALTH INSURANCE | Source: Ambulatory Visit | Attending: Internal Medicine | Admitting: Internal Medicine

## 2017-07-23 VITALS — BP 111/68 | HR 72 | Wt 159.0 lb

## 2017-07-23 DIAGNOSIS — F17200 Nicotine dependence, unspecified, uncomplicated: Secondary | ICD-10-CM

## 2017-07-23 DIAGNOSIS — Z8249 Family history of ischemic heart disease and other diseases of the circulatory system: Secondary | ICD-10-CM | POA: Insufficient documentation

## 2017-07-23 DIAGNOSIS — I5022 Chronic systolic (congestive) heart failure: Secondary | ICD-10-CM | POA: Diagnosis not present

## 2017-07-23 DIAGNOSIS — I110 Hypertensive heart disease with heart failure: Secondary | ICD-10-CM | POA: Diagnosis present

## 2017-07-23 DIAGNOSIS — F17210 Nicotine dependence, cigarettes, uncomplicated: Secondary | ICD-10-CM | POA: Insufficient documentation

## 2017-07-23 DIAGNOSIS — I10 Essential (primary) hypertension: Secondary | ICD-10-CM | POA: Diagnosis not present

## 2017-07-23 LAB — BASIC METABOLIC PANEL
ANION GAP: 7 (ref 5–15)
BUN: 15 mg/dL (ref 6–20)
CHLORIDE: 108 mmol/L (ref 101–111)
CO2: 23 mmol/L (ref 22–32)
CREATININE: 0.71 mg/dL (ref 0.44–1.00)
Calcium: 9 mg/dL (ref 8.9–10.3)
GFR calc non Af Amer: >60 mL/min (ref >60)
Glucose, Bld: 126 mg/dL — ABNORMAL HIGH (ref 65–99)
POTASSIUM: 3.7 mmol/L (ref 3.5–5.1)
Sodium: 138 mmol/L (ref 135–145)

## 2017-07-23 NOTE — Progress Notes (Signed)
Patient ID: Kim Underwood, female   DOB: 01/08/1966, 51 y.o.   MRN: [id number]    Advanced Heart Failure Clinic Note   PCP: Dr Shela CommonsJ. Copland  HF Cardiology: Dr Gala RomneyBensimhon.   HPI:  Kim Underwood is a [age] year with a history of NICM, chronic systolic heart failure, HTN, and tobacco abuse.  Initially evaluated by Dr Excell Seltzerooper 12/21/12 .   Follow up for Heart Failure: Overall feeling fine. Denies SOB/PND/Orthopnea. Appetite ok. No fever or chills. Weight at home 160 pounds. Taking all medications. Smoking 1/2 PPD. Working full time at [employer].   02/13/13 Creatinine 0.82 Potassium 3.7 SPEP not detected  02/13/13 HIV non reactive  03/03/13 Creatinine 0.82 K 3.7  05/18/13 K+ 3.6, creatinine 0.59 06/14/13 K 4.2 Creatinine 0.7  TSH 0.18 08/03/13 K 3.8 creatinine 0.6  12/14/13 K 3.8 Creatinine 0.57 04/13/2014: K 3.9 Creatinine 0.62  12/2014: K 3.8 Creatinine 0.71  06/2015: K 3.3, Creatinine 0.69 10/2015: K 4.1 Creatinine 0.71   ECHO  12/14/12 EF 15%  06/08/13: EF 30-35% with global hypokinesis, normal RV, mild MR, grade I DD May 2015: EF 35-40%  Echo 06/2015 EF 55-60%, mild MR, mod LAE, Mild TR, Normal RV  SH: Lives alone. Current smoker . Works full time at [employer]  FH: Both her parents had heart failure. Mom had stents  ROS: All systems negative except as listed in HPI, PMH and Problem List.  Past Medical History:  Diagnosis Date  . CHF (congestive heart failure) (HCC)   . Chronic systolic heart failure (HCC)    a. NICM b. ECHO (11/2012): EF 15-20%, Diff HK, grade IV DD, mod MR, LA mildly dilated c. RHC (02/2013): RA: 3, RV: 35/7, PA: 32/11 (19), PCWP: 6, PA sat 69%, Fick CO/CI: 4.6 / 2.8 d. ECHO (12/2013): EF 35-40%, diff HK, grade I DD, trivial MR, LA mildly dialted, RV nl  . Hypertension, malignant   . NICM (nonischemic cardiomyopathy) (HCC)    LHC (02/2013): Lmain: no sig. dz, LAD: luminal irregularities, LCx: luminal irregularities, co-dominant, RCA: luminal irregularities, rel small,  co-dominant with LCx  . Tobacco abuse     Current Outpatient Medications  Medication Sig Dispense Refill  . acetaminophen (TYLENOL) 325 MG tablet Take 650 mg by mouth every 4 (four) hours as needed for moderate pain.    . carvedilol (COREG) 12.5 MG tablet TAKE ONE TABLET BY MOUTH TWICE DAILY WITH MEALS 180 tablet 3  . hydrALAZINE (APRESOLINE) 25 MG tablet TAKE ONE TABLET BY MOUTH TWICE DAILY 180 tablet 3  . isosorbide mononitrate (IMDUR) 30 MG 24 hr tablet Take 1 tablet (30 mg total) by mouth daily. Needs office visit 30 tablet 0  . lisinopril (PRINIVIL,ZESTRIL) 40 MG tablet TAKE ONE TABLET BY MOUTH ONCE DAILY 30 tablet 6  . spironolactone (ALDACTONE) 25 MG tablet TAKE ONE TABLET BY MOUTH ONCE DAILY 30 tablet 2   No current facility-administered medications for this encounter.     Vitals:   07/23/17 1033  BP: 111/68  Pulse: 72  SpO2: 100%  Weight: 159 lb (72.1 kg)    Wt Readings from Last 3 Encounters:  07/23/17 159 lb (72.1 kg)  06/24/16 160 lb 12.8 oz (72.9 kg)  09/26/15 159 lb 3.2 oz (72.2 kg)    PHYSICAL EXAM: General:  Well appearing. No resp difficulty. Walked in the clinic without difficulty.  HEENT: normal Neck: supple. no JVD. Carotids 2+ bilat; no bruits. No lymphadenopathy or thryomegaly appreciated. Cor: PMI nondisplaced. Regular rate & rhythm.  No rubs, gallops or murmurs. Lungs: clear Abdomen: soft, nontender, nondistended. No hepatosplenomegaly. No bruits or masses. Good bowel sounds. Extremities: no cyanosis, clubbing, rash, edema Neuro: alert & orientedx3, cranial nerves grossly intact. moves all 4 extremities w/o difficulty. Affect pleasant  ASSESSMENT & PLAN:  1. Chronic systolic HF: NICM, ? Due to HTN.  ECHO 2016 EF recovered 55-60%.  - Repeat ECHO if remains normal refer to general cardiology.  Volume status stable.  Continue current HF medications.   - Continue coreg 12.5 mg BID  -Continue hydralazine 25 mg BID and Imdur 30 mg daily.  - Continue  lisinopril 40 mg daily  - Continue sprio 25 mg daily.  2. HTN-  - Stable . Continue current regimen.    3. Smoking:  Current. Trying to stop. Discussed cessation options such as chantix and nicoderm patch.   Repeat BMET today and ECHO.   Follow up in 1 year.    Amy Clegg NP-C  10:37 AM

## 2017-07-23 NOTE — Patient Instructions (Signed)
Routine lab work today. Will notify you of abnormal results, otherwise no news is good news!  Will schedule you for an echocardiogram at [facility]. Address: [address] ([address]), [place], [place] [address]  Phone: [phone]  Follow up 1 year. We will call you closer to this time, or you may call our office to schedule 1 month before you are due to be seen. Take all medication as prescribed the day of your appointment. Bring all medications with you to your appointment.  Do the following things EVERYDAY: 1) Weigh yourself in the morning before breakfast. Write it down and keep it in a log. 2) Take your medicines as prescribed 3) Eat low salt foods-Limit salt (sodium) to 2000 mg per day.  4) Stay as active as you can everyday 5) Limit all fluids for the day to less than 2 liters

## 2017-07-28 ENCOUNTER — Other Ambulatory Visit (HOSPITAL_COMMUNITY): Payer: Self-pay | Admitting: Student

## 2017-08-05 ENCOUNTER — Ambulatory Visit (HOSPITAL_COMMUNITY): Payer: PRIVATE HEALTH INSURANCE | Attending: Cardiovascular Disease

## 2017-08-05 ENCOUNTER — Telehealth (HOSPITAL_COMMUNITY): Payer: Self-pay

## 2017-08-05 ENCOUNTER — Other Ambulatory Visit: Payer: Self-pay

## 2017-08-05 DIAGNOSIS — I5022 Chronic systolic (congestive) heart failure: Secondary | ICD-10-CM | POA: Diagnosis present

## 2017-08-05 DIAGNOSIS — I110 Hypertensive heart disease with heart failure: Secondary | ICD-10-CM | POA: Diagnosis not present

## 2017-08-05 DIAGNOSIS — Z720 Tobacco use: Secondary | ICD-10-CM | POA: Insufficient documentation

## 2017-08-05 DIAGNOSIS — I340 Nonrheumatic mitral (valve) insufficiency: Secondary | ICD-10-CM | POA: Diagnosis not present

## 2017-08-05 DIAGNOSIS — R29898 Other symptoms and signs involving the musculoskeletal system: Secondary | ICD-10-CM | POA: Insufficient documentation

## 2017-08-05 NOTE — Telephone Encounter (Signed)
Result Notes for ECHOCARDIOGRAM COMPLETE   Notes recorded by Chyrl Civatte, RN on 08/05/2017 at 2:10 PM EST Patient aware and apprecative ------  Notes recorded by Sherald Hess, NP on 08/05/2017 at 1:45 PM EST Please call. ------  Notes recorded by Tonye Becket D, NP on 08/05/2017 at 1:45 PM EST Mildly reduced. Continue current regimen.

## 2017-08-11 ENCOUNTER — Other Ambulatory Visit (HOSPITAL_COMMUNITY): Payer: Self-pay | Admitting: Internal Medicine

## 2017-08-11 DIAGNOSIS — I5022 Chronic systolic (congestive) heart failure: Secondary | ICD-10-CM

## 2017-08-13 ENCOUNTER — Other Ambulatory Visit (HOSPITAL_COMMUNITY): Payer: Self-pay

## 2017-08-13 DIAGNOSIS — I5022 Chronic systolic (congestive) heart failure: Secondary | ICD-10-CM

## 2017-08-13 MED ORDER — ISOSORBIDE MONONITRATE ER 30 MG PO TB24: ORAL_TABLET | ORAL | 0 refills | Status: DC

## 2017-09-01 ENCOUNTER — Other Ambulatory Visit (HOSPITAL_COMMUNITY): Payer: Self-pay | Admitting: Internal Medicine

## 2017-12-28 ENCOUNTER — Other Ambulatory Visit (HOSPITAL_COMMUNITY): Payer: Self-pay | Admitting: Internal Medicine

## 2018-01-04 ENCOUNTER — Other Ambulatory Visit (HOSPITAL_COMMUNITY): Payer: Self-pay | Admitting: Internal Medicine

## 2018-02-01 ENCOUNTER — Other Ambulatory Visit (HOSPITAL_COMMUNITY): Payer: Self-pay

## 2018-02-01 MED ORDER — LISINOPRIL 40 MG PO TABS: 40.0000 mg | ORAL_TABLET | Freq: Every day | ORAL | 1 refills | Status: DC

## 2018-07-23 ENCOUNTER — Other Ambulatory Visit (HOSPITAL_COMMUNITY): Payer: Self-pay | Admitting: Internal Medicine

## 2018-07-23 DIAGNOSIS — I5022 Chronic systolic (congestive) heart failure: Secondary | ICD-10-CM

## 2018-10-25 ENCOUNTER — Other Ambulatory Visit (HOSPITAL_COMMUNITY): Payer: Self-pay | Admitting: Internal Medicine

## 2018-10-25 DIAGNOSIS — I5022 Chronic systolic (congestive) heart failure: Secondary | ICD-10-CM

## 2018-10-26 NOTE — Progress Notes (Signed)
Patient ID: Kim Underwood, female   DOB: February 24, 1966, 53 y.o.   MRN: [id number]    Advanced Heart Failure Clinic Note   PCP: Dr Kim Commons. Underwood  HF Cardiology: Dr Kim Underwood  HPI:  Kim Underwood is a 53 y.o. female with a history of NICM, chronic systolic heart failure, HTN, and tobacco abuse.  Initially evaluated by Dr Kim Underwood 12/21/12 .   She presents today for annual follow up. Doing well. Denies SOB, orthopnea, edema, or PND. No CP or dizziness. Taking all medications, but hasn't taken yet today. She takes her BID medications at 12 pm and bedtime. She has been taking hydralazine 50 mg daily instead of 25 mg BID. Weight stable. Continues to smoke 1 PPD. Wants to quit. Works full time at [employer]. SBP 130s on home checks.   ECHO  12/14/12 EF 15%  06/08/13: EF 30-35% with global hypokinesis, normal RV, mild MR, grade I DD May 2015: EF 35-40%  Echo 06/2015 EF 55-60%, mild MR, mod LAE, Mild TR, Normal RV Echo 07/2017: EF 45-50%.   SH: Lives alone. Current smoker . Works full time at [employer]  FH: Both her parents had heart failure. Mom had stents  Review of systems complete and found to be negative unless listed in HPI.   Past Medical History:  Diagnosis Date  . CHF (congestive heart failure) (HCC)   . Chronic systolic heart failure (HCC)    a. NICM b. ECHO (11/2012): EF 15-20%, Diff HK, grade IV DD, mod MR, LA mildly dilated c. RHC (02/2013): RA: 3, RV: 35/7, PA: 32/11 (19), PCWP: 6, PA sat 69%, Fick CO/CI: 4.6 / 2.8 d. ECHO (12/2013): EF 35-40%, diff HK, grade I DD, trivial MR, LA mildly dialted, RV nl  . Hypertension, malignant   . NICM (nonischemic cardiomyopathy) (HCC)    LHC (02/2013): Lmain: no sig. dz, LAD: luminal irregularities, LCx: luminal irregularities, co-dominant, RCA: luminal irregularities, rel small, co-dominant with LCx  . Tobacco abuse     Current Outpatient Medications  Medication Sig Dispense Refill  . acetaminophen (TYLENOL) 325 MG tablet Take 650 mg by  mouth every 4 (four) hours as needed for moderate pain.    . carvedilol (COREG) 12.5 MG tablet TAKE 1 TABLET BY MOUTH TWICE DAILY WITH MEALS 60 tablet 11  . hydrALAZINE (APRESOLINE) 25 MG tablet TAKE 1 TABLET BY MOUTH TWICE DAILY 60 tablet 11  . isosorbide mononitrate (IMDUR) 30 MG 24 hr tablet Take 1 tablet (30 mg total) by mouth daily. 30 tablet 0  . lisinopril (PRINIVIL,ZESTRIL) 40 MG tablet Take 1 tablet (40 mg total) by mouth daily. 90 tablet 1  . spironolactone (ALDACTONE) 25 MG tablet TAKE 1 TABLET BY MOUTH ONCE DAILY 30 tablet 11   No current facility-administered medications for this encounter.     Vitals:   10/27/18 1055  BP: (!) 142/82  Pulse: (!) 52  SpO2: 99%  Weight: 73.8 kg (162 lb 12.8 oz)    Wt Readings from Last 3 Encounters:  10/27/18 73.8 kg (162 lb 12.8 oz)  07/23/17 72.1 kg (159 lb)  06/24/16 72.9 kg (160 lb 12.8 oz)    PHYSICAL EXAM: General: Well appearing. No resp difficulty. HEENT: Normal Neck: Supple. JVP 5-6. Carotids 2+ bilat; no bruits. No thyromegaly or nodule noted. Cor: PMI nondisplaced. RRR, No M/G/R noted Lungs: CTAB, normal effort. Abdomen: Soft, non-tender, non-distended, no HSM. No bruits or masses. +BS  Extremities: No cyanosis, clubbing, or rash. R and LLE no edema.  Neuro: Alert & orientedx3, cranial nerves grossly intact. moves all 4 extremities w/o difficulty. Affect pleasant    ASSESSMENT & PLAN:  1. Chronic systolic HF: NICM, ? Due to HTN.  ECHO 2016 EF recovered 55-60%.  - Echo 07/2017: EF 45-50%.  - NYHA I. Volume status stable. Does not need lasix.  - Continue coreg 12.5 mg BID. HR too low to increase.  - Increase hydralazine to 50 mg BID (has been taking 50 mg daily, cannot take TID due to her job). Continue Imdur 30 mg daily.  - Continue lisinopril 40 mg daily  - Continue sprio 25 mg daily. BMET today - Repeat echo.   2. HTN: - Elevated today. Increase hydralazine as above.   3. Smoking:  - Continues to smoke  slightly less than 1 ppd. Chantix made her gain weight and nicotine patch did not work. - We have prescribed her Wellbutrin SR in the past and it worked for her. I will prescribe for 3 months, but told her she needs to follow up with PCP for further management.   BMET today. Repeat echo. Increase hydralazine as above. 1 year with echo. If she remains stable, can likely discharge from HF clinic.    Kim Highland NP-C  11:08 AM    Greater than 50% of the 25 minute visit was spent in counseling/coordination of care regarding disease state education, salt/fluid restriction, sliding scale diuretics, and medication compliance.

## 2018-10-27 ENCOUNTER — Encounter (HOSPITAL_COMMUNITY): Payer: Self-pay

## 2018-10-27 ENCOUNTER — Ambulatory Visit (HOSPITAL_COMMUNITY)
Admission: RE | Admit: 2018-10-27 | Discharge: 2018-10-27 | Disposition: A | Discharge status: Home / Self Care | Payer: PRIVATE HEALTH INSURANCE | Source: Ambulatory Visit | Attending: Internal Medicine | Admitting: Internal Medicine

## 2018-10-27 ENCOUNTER — Other Ambulatory Visit: Payer: Self-pay

## 2018-10-27 VITALS — BP 142/82 | HR 52 | Wt 162.8 lb

## 2018-10-27 DIAGNOSIS — Z8249 Family history of ischemic heart disease and other diseases of the circulatory system: Secondary | ICD-10-CM | POA: Diagnosis not present

## 2018-10-27 DIAGNOSIS — I5022 Chronic systolic (congestive) heart failure: Secondary | ICD-10-CM | POA: Diagnosis present

## 2018-10-27 DIAGNOSIS — I110 Hypertensive heart disease with heart failure: Secondary | ICD-10-CM | POA: Diagnosis not present

## 2018-10-27 DIAGNOSIS — Z79899 Other long term (current) drug therapy: Secondary | ICD-10-CM | POA: Diagnosis not present

## 2018-10-27 DIAGNOSIS — I10 Essential (primary) hypertension: Secondary | ICD-10-CM | POA: Diagnosis not present

## 2018-10-27 DIAGNOSIS — I428 Other cardiomyopathies: Secondary | ICD-10-CM | POA: Insufficient documentation

## 2018-10-27 DIAGNOSIS — F17210 Nicotine dependence, cigarettes, uncomplicated: Secondary | ICD-10-CM | POA: Diagnosis not present

## 2018-10-27 DIAGNOSIS — F17200 Nicotine dependence, unspecified, uncomplicated: Secondary | ICD-10-CM | POA: Diagnosis not present

## 2018-10-27 LAB — BASIC METABOLIC PANEL
Anion gap: 6 (ref 5–15)
BUN: 16 mg/dL (ref 6–20)
CALCIUM: 8.9 mg/dL (ref 8.9–10.3)
CO2: 23 mmol/L (ref 22–32)
CREATININE: 0.68 mg/dL (ref 0.44–1.00)
Chloride: 108 mmol/L (ref 98–111)
GFR calc non Af Amer: >60 mL/min (ref >60)
Glucose, Bld: 110 mg/dL — ABNORMAL HIGH (ref 70–99)
Potassium: 3.5 mmol/L (ref 3.5–5.1)
SODIUM: 137 mmol/L (ref 135–145)

## 2018-10-27 MED ORDER — HYDRALAZINE HCL 50 MG PO TABS: 50.0000 mg | ORAL_TABLET | Freq: Two times a day (BID) | ORAL | 11 refills | Status: DC

## 2018-10-27 MED ORDER — BUPROPION HCL ER (SR) 150 MG PO TB12: ORAL_TABLET | ORAL | 0 refills | Status: DC

## 2018-10-27 NOTE — Patient Instructions (Signed)
Lab work done today. We will contact you for any abnormal lab work.  START Hydralazine 50mg  (1 tab) two times a day  START Wellbutrin 150mg  (1 tab) daily FOR 3 DAYS ONLY. THEN START 150mg  (1 tab) two times a day.  Your physician has requested that you have an echocardiogram. Echocardiography is a painless test that uses sound waves to create images of your heart. It provides your doctor with information about the size and shape of your heart and how well your heart's chambers and valves are working. This procedure takes approximately one hour. There are no restrictions for this procedure.  Please follow up with the Advanced Practice Provider in 1 year. An Echocardiogram will be done again at that time.

## 2018-11-04 ENCOUNTER — Telehealth (HOSPITAL_COMMUNITY): Payer: Self-pay

## 2018-11-04 NOTE — Telephone Encounter (Signed)
Spoke to patient to cancel her echo. Gave her the instructions to call us back in June to reschedule. Pt aware, agreeable and verbalized understanding.

## 2018-11-10 ENCOUNTER — Other Ambulatory Visit (HOSPITAL_COMMUNITY): Payer: Self-pay | Admitting: Cardiology

## 2018-11-10 ENCOUNTER — Ambulatory Visit (HOSPITAL_COMMUNITY): Payer: No Typology Code available for payment source

## 2018-11-23 ENCOUNTER — Other Ambulatory Visit (HOSPITAL_COMMUNITY): Payer: Self-pay | Admitting: Internal Medicine

## 2018-11-23 DIAGNOSIS — I5022 Chronic systolic (congestive) heart failure: Secondary | ICD-10-CM

## 2018-12-20 ENCOUNTER — Other Ambulatory Visit (HOSPITAL_COMMUNITY): Payer: Self-pay | Admitting: Cardiology

## 2019-01-24 ENCOUNTER — Telehealth (HOSPITAL_COMMUNITY): Payer: Self-pay | Admitting: Vascular Surgery

## 2019-01-24 NOTE — Telephone Encounter (Signed)
Returned pt call to reschedule echo from March

## 2019-02-14 ENCOUNTER — Other Ambulatory Visit (HOSPITAL_COMMUNITY): Payer: Self-pay | Admitting: Cardiology

## 2019-02-16 ENCOUNTER — Other Ambulatory Visit: Payer: Self-pay

## 2019-02-16 ENCOUNTER — Ambulatory Visit (HOSPITAL_COMMUNITY)
Admission: RE | Admit: 2019-02-16 | Discharge: 2019-02-16 | Disposition: A | Discharge status: Home / Self Care | Payer: No Typology Code available for payment source | Source: Ambulatory Visit | Attending: Cardiology | Admitting: Cardiology

## 2019-02-16 ENCOUNTER — Other Ambulatory Visit (HOSPITAL_COMMUNITY): Payer: Self-pay | Admitting: Cardiology

## 2019-02-16 DIAGNOSIS — I5022 Chronic systolic (congestive) heart failure: Secondary | ICD-10-CM | POA: Insufficient documentation

## 2019-02-16 DIAGNOSIS — I071 Rheumatic tricuspid insufficiency: Secondary | ICD-10-CM | POA: Insufficient documentation

## 2019-02-16 DIAGNOSIS — Z87891 Personal history of nicotine dependence: Secondary | ICD-10-CM | POA: Insufficient documentation

## 2019-02-16 DIAGNOSIS — I110 Hypertensive heart disease with heart failure: Secondary | ICD-10-CM | POA: Insufficient documentation

## 2019-02-16 MED ORDER — BUPROPION HCL ER (SR) 150 MG PO TB12: 150.0000 mg | ORAL_TABLET | Freq: Two times a day (BID) | ORAL | 1 refills | Status: DC

## 2019-02-16 MED ORDER — HYDRALAZINE HCL 50 MG PO TABS: 50.0000 mg | ORAL_TABLET | Freq: Two times a day (BID) | ORAL | 11 refills | Status: DC

## 2019-02-16 NOTE — Progress Notes (Signed)
  Echocardiogram 2D Echocardiogram has been performed.  Kim Underwood G [place] Huberty 02/16/2019, 2:10 PM

## 2019-02-22 ENCOUNTER — Other Ambulatory Visit (HOSPITAL_COMMUNITY): Payer: Self-pay

## 2019-02-22 MED ORDER — BUPROPION HCL ER (SR) 150 MG PO TB12: 150.0000 mg | ORAL_TABLET | Freq: Two times a day (BID) | ORAL | 1 refills | Status: DC

## 2019-02-27 ENCOUNTER — Other Ambulatory Visit (HOSPITAL_COMMUNITY): Payer: Self-pay

## 2019-02-27 MED ORDER — BUPROPION HCL ER (SR) 150 MG PO TB12: 150.0000 mg | ORAL_TABLET | Freq: Two times a day (BID) | ORAL | 1 refills | Status: DC

## 2019-03-04 ENCOUNTER — Other Ambulatory Visit (HOSPITAL_COMMUNITY): Payer: Self-pay | Admitting: Internal Medicine

## 2019-03-04 DIAGNOSIS — I5022 Chronic systolic (congestive) heart failure: Secondary | ICD-10-CM

## 2019-04-18 ENCOUNTER — Other Ambulatory Visit (HOSPITAL_COMMUNITY): Payer: Self-pay | Admitting: Cardiology

## 2019-05-02 ENCOUNTER — Other Ambulatory Visit (HOSPITAL_COMMUNITY): Payer: Self-pay | Admitting: Internal Medicine

## 2019-06-02 ENCOUNTER — Other Ambulatory Visit (HOSPITAL_COMMUNITY): Payer: Self-pay | Admitting: Cardiology

## 2019-06-23 ENCOUNTER — Other Ambulatory Visit (HOSPITAL_COMMUNITY): Payer: Self-pay | Admitting: Internal Medicine

## 2019-06-23 DIAGNOSIS — I5022 Chronic systolic (congestive) heart failure: Secondary | ICD-10-CM

## 2019-06-26 ENCOUNTER — Other Ambulatory Visit (HOSPITAL_COMMUNITY): Payer: Self-pay

## 2019-06-26 DIAGNOSIS — I5022 Chronic systolic (congestive) heart failure: Secondary | ICD-10-CM

## 2019-06-26 MED ORDER — ISOSORBIDE MONONITRATE ER 30 MG PO TB24: 30.0000 mg | ORAL_TABLET | Freq: Every day | ORAL | 0 refills | Status: DC

## 2019-07-01 ENCOUNTER — Other Ambulatory Visit (HOSPITAL_COMMUNITY): Payer: Self-pay | Admitting: Internal Medicine

## 2019-07-01 DIAGNOSIS — I5022 Chronic systolic (congestive) heart failure: Secondary | ICD-10-CM

## 2019-08-19 ENCOUNTER — Other Ambulatory Visit (HOSPITAL_COMMUNITY): Payer: Self-pay | Admitting: Cardiology

## 2019-08-31 ENCOUNTER — Other Ambulatory Visit (HOSPITAL_COMMUNITY): Payer: Self-pay | Admitting: Cardiology

## 2019-10-06 ENCOUNTER — Other Ambulatory Visit (HOSPITAL_COMMUNITY): Payer: Self-pay | Admitting: Internal Medicine

## 2019-11-06 ENCOUNTER — Other Ambulatory Visit (HOSPITAL_COMMUNITY): Payer: Self-pay | Admitting: Internal Medicine

## 2019-11-06 DIAGNOSIS — I5022 Chronic systolic (congestive) heart failure: Secondary | ICD-10-CM

## 2019-11-24 ENCOUNTER — Other Ambulatory Visit (HOSPITAL_COMMUNITY): Payer: Self-pay

## 2019-11-24 MED ORDER — HYDRALAZINE HCL 50 MG PO TABS: 50.0000 mg | ORAL_TABLET | Freq: Two times a day (BID) | ORAL | 0 refills | Status: DC

## 2019-12-26 ENCOUNTER — Ambulatory Visit (HOSPITAL_COMMUNITY)
Admission: RE | Admit: 2019-12-26 | Discharge: 2019-12-26 | Disposition: A | Discharge status: Home / Self Care | Payer: No Typology Code available for payment source | Source: Ambulatory Visit | Attending: Cardiology | Admitting: Cardiology

## 2019-12-26 ENCOUNTER — Other Ambulatory Visit: Payer: Self-pay

## 2019-12-26 ENCOUNTER — Encounter (HOSPITAL_COMMUNITY): Payer: Self-pay

## 2019-12-26 VITALS — BP 162/84 | HR 63 | Wt 157.2 lb

## 2019-12-26 DIAGNOSIS — Z8249 Family history of ischemic heart disease and other diseases of the circulatory system: Secondary | ICD-10-CM | POA: Insufficient documentation

## 2019-12-26 DIAGNOSIS — I10 Essential (primary) hypertension: Secondary | ICD-10-CM | POA: Diagnosis not present

## 2019-12-26 DIAGNOSIS — I110 Hypertensive heart disease with heart failure: Secondary | ICD-10-CM | POA: Insufficient documentation

## 2019-12-26 DIAGNOSIS — F17210 Nicotine dependence, cigarettes, uncomplicated: Secondary | ICD-10-CM | POA: Insufficient documentation

## 2019-12-26 DIAGNOSIS — I428 Other cardiomyopathies: Secondary | ICD-10-CM | POA: Insufficient documentation

## 2019-12-26 DIAGNOSIS — I5022 Chronic systolic (congestive) heart failure: Secondary | ICD-10-CM | POA: Insufficient documentation

## 2019-12-26 DIAGNOSIS — Z79899 Other long term (current) drug therapy: Secondary | ICD-10-CM | POA: Insufficient documentation

## 2019-12-26 LAB — BASIC METABOLIC PANEL
Anion gap: 7 (ref 5–15)
BUN: 14 mg/dL (ref 6–20)
CO2: 23 mmol/L (ref 22–32)
Calcium: 9 mg/dL (ref 8.9–10.3)
Chloride: 109 mmol/L (ref 98–111)
Creatinine, Ser: 0.67 mg/dL (ref 0.44–1.00)
GFR calc Af Amer: >60 mL/min (ref >60)
GFR calc non Af Amer: >60 mL/min (ref >60)
Glucose, Bld: 130 mg/dL — ABNORMAL HIGH (ref 70–99)
Potassium: 3.3 mmol/L — ABNORMAL LOW (ref 3.5–5.1)
Sodium: 139 mmol/L (ref 135–145)

## 2019-12-26 MED ORDER — ENTRESTO 49-51 MG PO TABS: 1.0000 | ORAL_TABLET | Freq: Two times a day (BID) | ORAL | 3 refills | Status: DC

## 2019-12-26 NOTE — Progress Notes (Signed)
Patient ID: Kim Underwood, female   DOB: 10-Aug-1966, 54 y.o.   MRN: [id number]    Advanced Heart Failure Clinic Note   PCP: Dr Shela Commons. [place]  HF Cardiology: Dr Shirlee Latch  Reason for Visit: f/u for chronic systolic heart failure   HPI:  Kim Underwood is a 54 y.o. female with a history of NICM, chronic systolic heart failure, HTN, and tobacco abuse.  Initially evaluated by Dr Excell Seltzer 12/21/12 .   Her EF was as low at 15% in 2014. This has improved. Last echo 02/2019 showed EF 45-50%.   She presents today for annual follow up. Last seen 10/2018. Doing well. Works at [employer]. Very busy at work. Exercise tolerance ok. NYHA Class II. Denies orthopnea/PND. No LEE. BP elevated today but has not yet taken AM meds. Complains of persistent cough. She is on 40 mg of lisinopril. Last dose was yesterday around 10 AM.    ECHO  12/14/12 EF 15%  06/08/13: EF 30-35% with global hypokinesis, normal RV, mild MR, grade I DD May 2015: EF 35-40%  Echo 06/2015 EF 55-60%, mild MR, mod LAE, Mild TR, Normal RV Echo 07/2017: EF 45-50%.  Echo 02/2019: EF 45-50%   SH: Lives alone. Current smoker . Works full time at [employer]   FH: Both her parents had heart failure. Mom had stents  Review of systems complete and found to be negative unless listed in HPI.   Past Medical History:  Diagnosis Date  . CHF (congestive heart failure) (HCC)   . Chronic systolic heart failure (HCC)    a. NICM b. ECHO (11/2012): EF 15-20%, Diff HK, grade IV DD, mod MR, LA mildly dilated c. RHC (02/2013): RA: 3, RV: 35/7, PA: 32/11 (19), PCWP: 6, PA sat 69%, Fick CO/CI: 4.6 / 2.8 d. ECHO (12/2013): EF 35-40%, diff HK, grade I DD, trivial MR, LA mildly dialted, RV nl  . Hypertension, malignant   . NICM (nonischemic cardiomyopathy) (HCC)    LHC (02/2013): Lmain: no sig. dz, LAD: luminal irregularities, LCx: luminal irregularities, co-dominant, RCA: luminal irregularities, rel small, co-dominant with LCx  . Tobacco abuse      Current Outpatient Medications  Medication Sig Dispense Refill  . acetaminophen (TYLENOL) 325 MG tablet Take 650 mg by mouth every 4 (four) hours as needed for moderate pain.    Marland Kitchen buPROPion (WELLBUTRIN SR) 150 MG 12 hr tablet Take 1 tablet (150 mg total) by mouth 2 (two) times daily. Contact PCP for further refills 60 tablet 1  . carvedilol (COREG) 12.5 MG tablet Take 1 tablet (12.5 mg total) by mouth 2 (two) times daily with a meal. Needs appt 180 tablet 0  . hydrALAZINE (APRESOLINE) 50 MG tablet Take 1 tablet (50 mg total) by mouth 2 (two) times daily. Needs appt for further refills 180 tablet 0  . isosorbide mononitrate (IMDUR) 30 MG 24 hr tablet Take 1 tablet by mouth once daily 90 tablet 3  . lisinopril (ZESTRIL) 40 MG tablet Take 1 tablet by mouth once daily 90 tablet 0  . spironolactone (ALDACTONE) 25 MG tablet Take 1 tablet by mouth once daily 90 tablet 0   No current facility-administered medications for this encounter.    Vitals:   12/26/19 0911  BP: (!) 162/84  Pulse: 63  SpO2: 100%  Weight: 71.3 kg (157 lb 3.2 oz)    Wt Readings from Last 3 Encounters:  12/26/19 71.3 kg (157 lb 3.2 oz)  10/27/18 73.8 kg (162 lb 12.8 oz)  07/23/17 72.1 kg (159 lb)    PHYSICAL EXAM: General:  Well appearing. No respiratory difficulty HEENT: normal Neck: supple. no JVD. Carotids 2+ bilat; no bruits. No lymphadenopathy or thyromegaly appreciated. Cor: PMI nondisplaced. Regular rate & rhythm. No rubs, gallops or murmurs. Lungs: clear Abdomen: soft, nontender, nondistended. No hepatosplenomegaly. No bruits or masses. Good bowel sounds. Extremities: no cyanosis, clubbing, rash, edema Neuro: alert & oriented x 3, cranial nerves grossly intact. moves all 4 extremities w/o difficulty. Affect pleasant.    ASSESSMENT & PLAN:  1. Chronic systolic HF: NICM, ? Due to HTN.  ECHO 2016 EF recovered 55-60%.  - Repeat Echo 7/20 EF 45-50%.  - NYHA II. Volume status stable. Does not need lasix.   - Continue coreg 12.5 mg BID. Resting HR in the 60s, will not titrate further - Continue hydralazine 50 mg BID (cannot take TID due to her job).  - Continue Imdur 30 mg daily.  - Complains of persistent cough w/ lisinopril. Currently on 40 mg daily. Last dose was 24 hrs ago. Will discontinue lisinopril and Start Entresto 49-51 mg bid starting tomorrow (she was instructed not to take any lisinopril today to continue to allow for washout). She could potentially still have a persistent cough w/ Entresto. If cough continues, can later switch to losartan.   - Continue sprio 25 mg daily.  - Check BMP today and again in 7 days   2. HTN: - Elevated today but has not yet taken AM meds .  - Will plan to stop Lisinopril as outlined above and start Entresto 49-51 bid after ACEi washout  - continue other meds as prescribed   3. Smoking:  - Continues to smoke cigarettes but has cut back over the last year, 1 pack will last 3-4 days - she was encouraged to try to quit completely  - Chantix made her gain weight and nicotine patch did not work. Also failed Wellbutrin.   F/u w/ pharmD in 3-4 weeks to assess response to [facility] and further titrate meds    [employer]  PA-C 9:18 AM

## 2019-12-26 NOTE — Patient Instructions (Addendum)
Stop Losartan  START Entresto 24/26mg  twice daily **first dose 12/27/2019**  Routine lab work today. Will notify you of abnormal results  Follow up with PharmD in 3-4 weeks  Follow up with Dr.McLean in 6 months (please call our office at [phone] in October to schedule your November appointment)

## 2019-12-27 ENCOUNTER — Telehealth (HOSPITAL_COMMUNITY): Payer: Self-pay | Admitting: Cardiology

## 2019-12-27 ENCOUNTER — Other Ambulatory Visit (HOSPITAL_COMMUNITY): Payer: Self-pay | Admitting: Cardiology

## 2020-01-04 ENCOUNTER — Other Ambulatory Visit: Payer: Self-pay

## 2020-01-04 ENCOUNTER — Ambulatory Visit (HOSPITAL_COMMUNITY)
Admission: RE | Admit: 2020-01-04 | Discharge: 2020-01-04 | Disposition: A | Discharge status: Home / Self Care | Payer: No Typology Code available for payment source | Source: Ambulatory Visit | Attending: Internal Medicine | Admitting: Internal Medicine

## 2020-01-04 ENCOUNTER — Other Ambulatory Visit (HOSPITAL_COMMUNITY): Payer: Self-pay | Admitting: *Deleted

## 2020-01-04 DIAGNOSIS — I5022 Chronic systolic (congestive) heart failure: Secondary | ICD-10-CM

## 2020-01-04 LAB — BASIC METABOLIC PANEL
Anion gap: 11 (ref 5–15)
BUN: 16 mg/dL (ref 6–20)
CO2: 23 mmol/L (ref 22–32)
Calcium: 8.9 mg/dL (ref 8.9–10.3)
Chloride: 104 mmol/L (ref 98–111)
Creatinine, Ser: 0.75 mg/dL (ref 0.44–1.00)
GFR calc Af Amer: >60 mL/min (ref >60)
GFR calc non Af Amer: >60 mL/min (ref >60)
Glucose, Bld: 126 mg/dL — ABNORMAL HIGH (ref 70–99)
Potassium: 3.5 mmol/L (ref 3.5–5.1)
Sodium: 138 mmol/L (ref 135–145)

## 2020-01-19 NOTE — Telephone Encounter (Signed)
Opened in error

## 2020-01-24 ENCOUNTER — Inpatient Hospital Stay (HOSPITAL_COMMUNITY): Admission: RE | Admit: 2020-01-24 | Payer: No Typology Code available for payment source | Source: Ambulatory Visit

## 2020-02-12 NOTE — Progress Notes (Signed)
PCP: Dr Lenna Sciara. [place]  HF Cardiology: Dr Aundra Dubin  HPI:  Kim Underwood is a 54 y.o. female with a history of NICM, chronic systolic heart failure, HTN, and tobacco abuse.  Initially evaluated by Dr Burt Knack 12/21/12.   Her EF was as low at 15% in 2014. This has improved. Echo 02/2019 showed EF 45-50%.   She recently presented to HF Clinic for annual follow up with Lyda Jester PA-C on 12/26/19. Last seen 10/2018. Reported doing well. Works at [employer]. Was very busy at work. Exercise tolerance was ok. NYHA Class II. Denied orthopnea/PND. No LEE. BP elevated in clinic but she had not yet taken AM meds. Complained of persistent cough. She was on 40 mg of lisinopril.   Today she returns to HF clinic for pharmacist medication titration. At last visit with PA-C, lisinopril was discontinued and Entresto 49/51 mg BID was started after 36 hours. Overall she is feeling well today. Noted her cough has resolved since lisinopril was changed to [facility]. She did state that her BP was very low when she first started the [place]. Her BP dropped to 96/63 and she was very dizzy and lightheaded. She held [facility] for 3 days and then restarted it. Since that time, she has been tolerating it better. Her BP at home is 110/65-70 and she doesn't experience any dizziness unless she forgets to take it with food. Her BP in clinic today was 120/76, but she did not take her morning medications. Weight has been stable at 155 lbs at home. No LEE, PND or orthopnea.   HF Medications: Carvedilol 12.5 mg BID Entresto 49/51 mg BID Spirolactone 25 mg daily Hydralazine 50 mg BID Isosorbide mononitrate 30 mg daily  Has the patient been experiencing any side effects to the medications prescribed?  No   Does the patient have any problems obtaining medications due to transportation or finances?   Yes - she stated she was unable to fford her Entresto copay of $60/month. Since she has Pharmacist, community, I was able to provide  her with a copay card today to reduce her copay to $10.   Understanding of regimen: good Understanding of indications: good Potential of compliance: good Patient understands to avoid NSAIDs. Patient understands to avoid decongestants.    Pertinent Lab Values (01/04/20): Marland Kitchen Serum creatinine 0.75, BUN 16, Potassium 3.5, Sodium 138  Vital Signs: . Weight: 155.6 lbs (last clinic weight: 157.2 lbs) . Blood pressure: 120/76 - did not taking morning medications  . Heart rate: 64   Assessment: 1. Chronic systolic HF: NICM, ? Due to HTN.  ECHO 2016 EF recovered 55-60%.  - Repeat Echo 7/20 EF 45-50%.  - NYHA II. Volume status stable. Does not need furosemide.  - Continue carvedilol 12.5 mg BID. Resting HR in the 60s, will not titrate further. - Continue Entresto 49/51 mg BID. Cough has resolved since switching lisinopril to Entresto. Will hold off on uptitrating as BP has been on the lower side at home.  - Continue hydralazine 50 mg BID (cannot take TID due to her job).  - Continue Imdur 30 mg daily.  - Continue spironolactone 25 mg daily.   2. HTN: - Controlled - Continue BP medications as prescribed   3. Smoking:  - Continues to smoke cigarettes but has cut back over the last year, 1 pack will last 3-4 days - she was encouraged to try to quit completely  - Chantix made her gain weight and nicotine patch did not work. -She wishes to  retry Wellbutrin (bupropion SR) today. Will send in refills to her pharmacy.    Plan: 1) Medication changes: Based on clinical presentation, vital signs and recent labs will restart bupropion SR for smoking cessation today.  2) Follow-up: 6 months with Dr. Shirlee Latch (06/2020)   Karle Plumber, PharmD, BCPS, BCCP, CPP Heart Failure Clinic Pharmacist [phone]

## 2020-02-20 ENCOUNTER — Other Ambulatory Visit: Payer: Self-pay

## 2020-02-20 ENCOUNTER — Ambulatory Visit (HOSPITAL_COMMUNITY)
Admission: RE | Admit: 2020-02-20 | Discharge: 2020-02-20 | Disposition: A | Discharge status: Home / Self Care | Payer: No Typology Code available for payment source | Source: Ambulatory Visit | Attending: Cardiology | Admitting: Cardiology

## 2020-02-20 VITALS — BP 120/76 | HR 64 | Wt 155.6 lb

## 2020-02-20 DIAGNOSIS — Z79899 Other long term (current) drug therapy: Secondary | ICD-10-CM | POA: Diagnosis not present

## 2020-02-20 DIAGNOSIS — I110 Hypertensive heart disease with heart failure: Secondary | ICD-10-CM | POA: Diagnosis not present

## 2020-02-20 DIAGNOSIS — I428 Other cardiomyopathies: Secondary | ICD-10-CM | POA: Insufficient documentation

## 2020-02-20 DIAGNOSIS — F17210 Nicotine dependence, cigarettes, uncomplicated: Secondary | ICD-10-CM | POA: Diagnosis not present

## 2020-02-20 DIAGNOSIS — I5022 Chronic systolic (congestive) heart failure: Secondary | ICD-10-CM | POA: Diagnosis present

## 2020-02-20 MED ORDER — BUPROPION HCL ER (SR) 150 MG PO TB12: 150.0000 mg | ORAL_TABLET | Freq: Two times a day (BID) | ORAL | 4 refills | Status: DC

## 2020-02-20 NOTE — Patient Instructions (Addendum)
It was a pleasure seeing you today!  MEDICATIONS: -Restart bupropion for smoking cessation. -Call if you have questions about your medications.  NEXT APPOINTMENT: -Follow up with Dr.McLean in 6 months (please call our office at [phone] in October to schedule your November appointment).  In general, to take care of your heart failure: -Limit your fluid intake to 2 Liters (half-gallon) per day.   -Limit your salt intake to ideally 2-3 grams (2000-3000 mg) per day. -Weigh yourself daily and record, and bring that "weight diary" to your next appointment.  (Weight gain of 2-3 pounds in 1 day typically means fluid weight.) -The medications for your heart are to help your heart and help you live longer.   -Please contact us before stopping any of your heart medications.  Call the clinic at [phone] with questions or to reschedule future appointments.

## 2020-02-21 ENCOUNTER — Other Ambulatory Visit (HOSPITAL_COMMUNITY): Payer: Self-pay | Admitting: Internal Medicine

## 2020-03-07 ENCOUNTER — Other Ambulatory Visit (HOSPITAL_COMMUNITY): Payer: Self-pay | Admitting: Cardiology

## 2020-04-08 ENCOUNTER — Other Ambulatory Visit (HOSPITAL_COMMUNITY): Payer: No Typology Code available for payment source

## 2020-08-19 ENCOUNTER — Other Ambulatory Visit (HOSPITAL_COMMUNITY): Payer: Self-pay | Admitting: Cardiology

## 2020-08-22 ENCOUNTER — Telehealth (HOSPITAL_COMMUNITY): Payer: Self-pay | Admitting: Pharmacist

## 2020-08-22 MED ORDER — ENTRESTO 49-51 MG PO TABS: 1.0000 | ORAL_TABLET | Freq: Two times a day (BID) | ORAL | 11 refills | Status: DC

## 2020-08-22 NOTE — Telephone Encounter (Signed)
Entresto refill sent to [facility] per patient request.   Karle Plumber, PharmD, BCPS, BCCP, CPP Heart Failure Clinic Pharmacist [phone]

## 2020-10-29 ENCOUNTER — Other Ambulatory Visit (HOSPITAL_COMMUNITY): Payer: Self-pay | Admitting: *Deleted

## 2020-10-29 DIAGNOSIS — I5022 Chronic systolic (congestive) heart failure: Secondary | ICD-10-CM

## 2020-11-26 ENCOUNTER — Ambulatory Visit (HOSPITAL_COMMUNITY)
Admission: RE | Admit: 2020-11-26 | Discharge: 2020-11-26 | Disposition: A | Discharge status: Home / Self Care | Payer: PRIVATE HEALTH INSURANCE | Source: Ambulatory Visit | Attending: Family Medicine | Admitting: Family Medicine

## 2020-11-26 ENCOUNTER — Ambulatory Visit (HOSPITAL_BASED_OUTPATIENT_CLINIC_OR_DEPARTMENT_OTHER)
Admission: RE | Admit: 2020-11-26 | Discharge: 2020-11-26 | Disposition: A | Discharge status: Home / Self Care | Payer: PRIVATE HEALTH INSURANCE | Source: Ambulatory Visit | Attending: Cardiology | Admitting: Cardiology

## 2020-11-26 ENCOUNTER — Encounter (HOSPITAL_COMMUNITY): Payer: Self-pay

## 2020-11-26 ENCOUNTER — Other Ambulatory Visit: Payer: Self-pay

## 2020-11-26 VITALS — BP 120/80 | HR 57 | Wt 156.6 lb

## 2020-11-26 DIAGNOSIS — F17210 Nicotine dependence, cigarettes, uncomplicated: Secondary | ICD-10-CM | POA: Diagnosis not present

## 2020-11-26 DIAGNOSIS — Z8249 Family history of ischemic heart disease and other diseases of the circulatory system: Secondary | ICD-10-CM | POA: Diagnosis not present

## 2020-11-26 DIAGNOSIS — I428 Other cardiomyopathies: Secondary | ICD-10-CM | POA: Diagnosis not present

## 2020-11-26 DIAGNOSIS — I110 Hypertensive heart disease with heart failure: Secondary | ICD-10-CM | POA: Diagnosis not present

## 2020-11-26 DIAGNOSIS — I5022 Chronic systolic (congestive) heart failure: Secondary | ICD-10-CM

## 2020-11-26 DIAGNOSIS — Z79899 Other long term (current) drug therapy: Secondary | ICD-10-CM | POA: Diagnosis not present

## 2020-11-26 LAB — COMPREHENSIVE METABOLIC PANEL
ALT: 17 U/L (ref 0–44)
AST: 15 U/L (ref 15–41)
Albumin: 3.7 g/dL (ref 3.5–5.0)
Alkaline Phosphatase: 66 U/L (ref 38–126)
Anion gap: 6 (ref 5–15)
BUN: 12 mg/dL (ref 6–20)
CO2: 24 mmol/L (ref 22–32)
Calcium: 9 mg/dL (ref 8.9–10.3)
Chloride: 108 mmol/L (ref 98–111)
Creatinine, Ser: 0.69 mg/dL (ref 0.44–1.00)
GFR, Estimated: >60 mL/min (ref >60)
Glucose, Bld: 108 mg/dL — ABNORMAL HIGH (ref 70–99)
Potassium: 3.8 mmol/L (ref 3.5–5.1)
Sodium: 138 mmol/L (ref 135–145)
Total Bilirubin: 0.6 mg/dL (ref 0.3–1.2)
Total Protein: 6.8 g/dL (ref 6.5–8.1)

## 2020-11-26 LAB — LIPID PANEL
Cholesterol: 194 mg/dL (ref 0–200)
HDL: 31 mg/dL — ABNORMAL LOW (ref >40)
LDL Cholesterol: 119 mg/dL — ABNORMAL HIGH (ref 0–99)
Total CHOL/HDL Ratio: 6.3 RATIO
Triglycerides: 222 mg/dL — ABNORMAL HIGH (ref <150)
VLDL: 44 mg/dL — ABNORMAL HIGH (ref 0–40)

## 2020-11-26 LAB — ECHOCARDIOGRAM COMPLETE
Area-P 1/2: 2.21 cm2
S' Lateral: 3.8 cm

## 2020-11-26 LAB — CBC
HCT: 38.2 % (ref 36.0–46.0)
Hemoglobin: 12.5 g/dL (ref 12.0–15.0)
MCH: 31 pg (ref 26.0–34.0)
MCHC: 32.7 g/dL (ref 30.0–36.0)
MCV: 94.8 fL (ref 80.0–100.0)
Platelets: 315 10*3/uL (ref 150–400)
RBC: 4.03 MIL/uL (ref 3.87–5.11)
RDW: 14 % (ref 11.5–15.5)
WBC: 9.9 10*3/uL (ref 4.0–10.5)
nRBC: 0 % (ref 0.0–0.2)

## 2020-11-26 LAB — MAGNESIUM: Magnesium: 2.2 mg/dL (ref 1.7–2.4)

## 2020-11-26 MED ORDER — CARVEDILOL 6.25 MG PO TABS: 6.2500 mg | ORAL_TABLET | Freq: Two times a day (BID) | ORAL | 11 refills | Status: DC

## 2020-11-26 NOTE — Patient Instructions (Signed)
DECREASE Coreg to 6.25mg , one tab daily  Labs today We will only contact you if something comes back abnormal or we need to make some changes. Otherwise no news is good news!  Your physician recommends that you schedule a follow-up appointment in: 6 months with Dr Shirlee Latch  Do the following things EVERYDAY: 1) Weigh yourself in the morning before breakfast. Write it down and keep it in a log. 2) Take your medicines as prescribed 3) Eat low salt foods--Limit salt (sodium) to 2000 mg per day.  4) Stay as active as you can everyday 5) Limit all fluids for the day to less than 2 liters At the Advanced Heart Failure Clinic, you and your health needs are our priority. As part of our continuing mission to provide you with exceptional heart care, we have created designated Provider Care Teams. These Care Teams include your primary Cardiologist (physician) and Advanced Practice Providers (APPs- Physician Assistants and Nurse Practitioners) who all work together to provide you with the care you need, when you need it.   You may see any of the following providers on your designated Care Team at your next follow up: Marland Kitchen Dr Arvilla Meres . Dr Marca Ancona . Dr Thornell Mule . Tonye Becket, NP . Robbie Lis, PA . Shanda Bumps [place],NP . Karle Plumber, PharmD   Please be sure to bring in all your medications bottles to every appointment.   If you have any questions or concerns before your next appointment please send [place] a message through [place] or call our office at [phone].    TO LEAVE A MESSAGE FOR THE NURSE SELECT OPTION 2, PLEASE LEAVE A MESSAGE INCLUDING: . YOUR NAME . DATE OF BIRTH . CALL BACK NUMBER . REASON FOR CALL**this is important as we prioritize the call backs  YOU WILL RECEIVE A CALL BACK THE SAME DAY AS LONG AS YOU CALL BEFORE 4:00 PM Please see our updated No Show and Same Day Appointment Cancellation Policy attached to your AVS.

## 2020-11-26 NOTE — Progress Notes (Signed)
Patient ID: Kim Underwood, female   DOB: Jan 09, 1966, 55 y.o.   MRN: [id number]    Advanced Heart Failure Clinic Note   PCP: Dr Shela Commons. [place]  HF Cardiology: Dr Shirlee Latch  Reason for Visit: f/u for chronic systolic heart failure   HPI:  Kim Underwood is a 55 y.o. female with a history of NICM, chronic systolic heart failure, HTN, and tobacco abuse.  Initially evaluated by Dr Excell Seltzer 12/21/12 .   Her EF was as low at 15% in 2014. This has improved. Last echo 02/2019 showed EF 45-50%.   She presents today for annual follow up. Last seen 12/2019. She continues to do well. No health issues over the past year. Still working at [employer]. Busy at work. Exercise tolerance is ok. NYHA Class II. Denies CP. Reports full med compliance and notes some occasional  possitional lightlessness after taking meds, usually improves quickly. No syncope/ near syncope. She monitors her BP at work, SBPs usually in the 110s. Also w/ mild occasional fatigue. EKG shows sinus bradycardia 56 bpm.   Unfortunately, she has resumed smoking after quitting of a period of time. Reports she started back due to the stress for COVID pandemic. She would like to quit again and plans to go back on Wellbutrin, which she had initial success with.   Echo was performed in clinic today. LVEF normal 50-55%. RV normal.    ECHO  12/14/12 EF 15%  06/08/13: EF 30-35% with global hypokinesis, normal RV, mild MR, grade I DD May 2015: EF 35-40%  Echo 06/2015 EF 55-60%, mild MR, mod LAE, Mild TR, Normal RV Echo 07/2017: EF 45-50%.  Echo 02/2019: EF 45-50%     SH: Lives alone. Current smoker . Works full time at [employer]   FH: Both her parents had heart failure. Mom had stents  Review of systems complete and found to be negative unless listed in HPI.   Past Medical History:  Diagnosis Date  . CHF (congestive heart failure) (HCC)   . Chronic systolic heart failure (HCC)    a. NICM b. ECHO (11/2012): EF 15-20%, Diff HK, grade IV  DD, mod MR, LA mildly dilated c. RHC (02/2013): RA: 3, RV: 35/7, PA: 32/11 (19), PCWP: 6, PA sat 69%, Fick CO/CI: 4.6 / 2.8 d. ECHO (12/2013): EF 35-40%, diff HK, grade I DD, trivial MR, LA mildly dialted, RV nl  . Hypertension, malignant   . NICM (nonischemic cardiomyopathy) (HCC)    LHC (02/2013): Lmain: no sig. dz, LAD: luminal irregularities, LCx: luminal irregularities, co-dominant, RCA: luminal irregularities, rel small, co-dominant with LCx  . Tobacco abuse     Current Outpatient Medications  Medication Sig Dispense Refill  . acetaminophen (TYLENOL) 325 MG tablet Take 650 mg by mouth every 4 (four) hours as needed for moderate pain.    Marland Kitchen buPROPion (WELLBUTRIN SR) 150 MG 12 hr tablet Take 1 tablet (150 mg total) by mouth 2 (two) times daily. 60 tablet 4  . carvedilol (COREG) 12.5 MG tablet Take 1 tablet (12.5 mg total) by mouth 2 (two) times daily with a meal. 180 tablet 2  . hydrALAZINE (APRESOLINE) 50 MG tablet Take 1 tablet (50 mg total) by mouth 2 (two) times daily. 180 tablet 3  . isosorbide mononitrate (IMDUR) 30 MG 24 hr tablet Take 1 tablet by mouth once daily 90 tablet 3  . sacubitril-valsartan (ENTRESTO) 49-51 MG Take 1 tablet by mouth 2 (two) times daily. 60 tablet 11  . spironolactone (ALDACTONE) 25 MG  tablet Take 1 tablet by mouth once daily 90 tablet 3   No current facility-administered medications for this encounter.    There were no vitals filed for this visit.  Wt Readings from Last 3 Encounters:  02/20/20 70.6 kg (155 lb 9.6 oz)  12/26/19 71.3 kg (157 lb 3.2 oz)  10/27/18 73.8 kg (162 lb 12.8 oz)    PHYSICAL EXAM: General:  Well appearing. No respiratory difficulty HEENT: normal Neck: supple. no JVD. Carotids 2+ bilat; no bruits. No lymphadenopathy or thyromegaly appreciated. Cor: PMI nondisplaced. Regular rate & rhythm. No rubs, gallops or murmurs. Lungs: clear Abdomen: soft, nontender, nondistended. No hepatosplenomegaly. No bruits or masses. Good bowel  sounds. Extremities: no cyanosis, clubbing, rash, edema Neuro: alert & oriented x 3, cranial nerves grossly intact. moves all 4 extremities w/o difficulty. Affect pleasant.    ASSESSMENT & PLAN:  1. H/o Systolic HF>>Improved EF: NICM, ? Due to HTN. Echo in 2014 EF 15%.  ECHO 2016 EF recovered 55-60%.  - Repeat Echo 7/20 EF 45-50%.  - Echo repeated today, EF 50-55%. RV ok  - NYHA II. Volume status stable. Does not need lasix.  - Reduce Coreg to 6.25 mg bid due to bradycardia and fatigue  - Continue hydralazine 50 mg BID (cannot take TID due to her job).  - Continue Imdur 30 mg daily.  - Continue Entresto 49-51 mg bid. Will not increase due to positional dizziness   - Continue sprio 25 mg daily.  - BMP today   2. HTN: - Controlled on current regimen. Home SBPs ~110s.  - w/ occasional positional dizziness, mild fatigue and bradycardia, will reduce Coreg to 6.25 mg bid  3. Smoking:  - Continues to smoke cigarettes. Had quit but restarted during COVID pandemic - She plans to talk to PCP regarding restarting Wellbutin, which she has had success w/ in the past.   F/u w/ Dr. Shirlee Latch in 6 months.    Lamia Mariner  PA-C 8:24 AM

## 2020-11-26 NOTE — Progress Notes (Incomplete)
  Echocardiogram 2D Echocardiogram has been performed.  Augustine Radar 11/26/2020, 8:52 AM

## 2021-01-01 ENCOUNTER — Encounter (HOSPITAL_COMMUNITY): Payer: Self-pay | Admitting: Cardiology

## 2021-01-09 ENCOUNTER — Other Ambulatory Visit (HOSPITAL_COMMUNITY): Payer: Self-pay | Admitting: Internal Medicine

## 2021-01-09 DIAGNOSIS — I5022 Chronic systolic (congestive) heart failure: Secondary | ICD-10-CM

## 2021-02-21 ENCOUNTER — Other Ambulatory Visit (HOSPITAL_COMMUNITY): Payer: Self-pay | Admitting: Cardiology

## 2021-03-03 ENCOUNTER — Other Ambulatory Visit: Payer: Self-pay

## 2021-03-03 ENCOUNTER — Ambulatory Visit
Admission: EM | Admit: 2021-03-03 | Discharge: 2021-03-03 | Disposition: A | Discharge status: Home / Self Care | Payer: PRIVATE HEALTH INSURANCE | Attending: Emergency Medicine | Admitting: Emergency Medicine

## 2021-03-03 DIAGNOSIS — S93491A Sprain of other ligament of right ankle, initial encounter: Secondary | ICD-10-CM

## 2021-03-03 MED ORDER — IBUPROFEN 800 MG PO TABS
800.0000 mg | ORAL_TABLET | Freq: Three times a day (TID) | ORAL | 0 refills | Status: DC
Start: 2021-03-03 — End: 2022-01-06

## 2021-03-03 NOTE — ED Triage Notes (Signed)
Pt presents with c/o right foot pain from stepping off steps 2 days ago

## 2021-03-03 NOTE — Discharge Instructions (Addendum)
Use anti-inflammatories for pain/swelling. You may take up to 800 mg Ibuprofen every 8 hours with food. You may supplement Ibuprofen with Tylenol [id number] mg every 8 hours.  Ace wrap for compression/support Ice and elevate Follow-up if not improving or worsening

## 2021-03-04 NOTE — ED Provider Notes (Signed)
[facility]    CSN: [id number] Arrival date & time: 03/03/21  1601      History   Chief Complaint Chief Complaint  Patient presents with   Foot Pain    HPI Kim Underwood is a 55 y.o. female history of CHF, hypertension, presenting today for evaluation of right ankle injury.  Patient reports that 2 days ago she rolled her right ankle inward while going downstairs.  She denies falling.  Since she has had discomfort in her ankle/foot.  Has been on her feet a lot at work.  Denies prior history of fracture or trauma to ankle or foot.  Denies numbness or tingling.  HPI  Past Medical History:  Diagnosis Date   CHF (congestive heart failure) (HCC)    Chronic systolic heart failure (HCC)    a. NICM b. ECHO (11/2012): EF 15-20%, Diff HK, grade IV DD, mod MR, LA mildly dilated c. RHC (02/2013): RA: 3, RV: 35/7, PA: 32/11 (19), PCWP: 6, PA sat 69%, Fick CO/CI: 4.6 / 2.8 d. ECHO (12/2013): EF 35-40%, diff HK, grade I DD, trivial MR, LA mildly dialted, RV nl   Hypertension, malignant    NICM (nonischemic cardiomyopathy) (HCC)    LHC (02/2013): Lmain: no sig. dz, LAD: luminal irregularities, LCx: luminal irregularities, co-dominant, RCA: luminal irregularities, rel small, co-dominant with LCx   Tobacco abuse     Patient Active Problem List   Diagnosis Date Noted   Smoker 03/16/2013   Chronic systolic CHF (congestive heart failure) (HCC) 02/13/2013   Acute on chronic combined systolic and diastolic heart failure (HCC) 12/14/2012   Hypertension 09/21/2011    Past Surgical History:  Procedure Laterality Date   TUBAL LIGATION  2000    OB History   No obstetric history on file.      Home Medications    Prior to Admission medications   Medication Sig Start Date End Date Taking? Authorizing Provider  ibuprofen (ADVIL) 800 MG tablet Take 1 tablet (800 mg total) by mouth 3 (three) times daily. 03/03/21  Yes Halie Gass C, PA-C  acetaminophen (TYLENOL) 325 MG tablet  Take 650 mg by mouth every 4 (four) hours as needed for moderate pain.    [provider]  buPROPion (WELLBUTRIN SR) 150 MG 12 hr tablet Take 1 tablet (150 mg total) by mouth 2 (two) times daily. 02/20/20   Laurey Morale, MD  carvedilol (COREG) 6.25 MG tablet Take 1 tablet (6.25 mg total) by mouth 2 (two) times daily with a meal. 11/26/20   Robbie Lis M, PA-C  hydrALAZINE (APRESOLINE) 50 MG tablet Take 1 tablet (50 mg total) by mouth 2 (two) times daily. 03/08/20   Laurey Morale, MD  isosorbide mononitrate (IMDUR) 30 MG 24 hr tablet Take 1 tablet by mouth once daily 01/09/21   Bensimhon, Bevelyn Buckles, MD  sacubitril-valsartan (ENTRESTO) 49-51 MG Take 1 tablet by mouth 2 (two) times daily. 08/22/20   Laurey Morale, MD  spironolactone (ALDACTONE) 25 MG tablet Take 1 tablet by mouth once daily 02/21/21   Laurey Morale, MD    Family History Family History  Problem Relation Age of Onset   Stroke Mother    Heart disease Mother    Heart disease Father     Social History Social History   Tobacco Use   Smoking status: Every Day    Packs/day: 0.50    Types: Cigarettes   Smokeless tobacco: Never  Substance Use Topics   Alcohol use: Yes  Comment: very rarely   Drug use: No     Allergies   Patient has no known allergies.   Review of Systems Review of Systems  Constitutional:  Negative for fatigue and fever.  HENT:  Negative for mouth sores.   Eyes:  Negative for visual disturbance.  Respiratory:  Negative for shortness of breath.   Cardiovascular:  Negative for chest pain.  Gastrointestinal:  Negative for abdominal pain, nausea and vomiting.  Genitourinary:  Negative for genital sores.  Musculoskeletal:  Positive for arthralgias. Negative for joint swelling.  Skin:  Negative for color change, rash and wound.  Neurological:  Negative for dizziness, weakness, light-headedness and headaches.    Physical Exam Triage Vital Signs ED Triage Vitals  Enc Vitals Group      BP 03/03/21 1614 (!) 157/92     Pulse Rate 03/03/21 1614 84     Resp 03/03/21 1614 20     Temp 03/03/21 1614 98.1 F (36.7 C)     Temp src --      SpO2 03/03/21 1614 94 %     Weight --      Height --      Head Circumference --      Peak Flow --      Pain Score 03/03/21 1612 8     Pain Loc --      Pain Edu? --      Excl. in GC? --    No data found.  Updated Vital Signs BP (!) 157/92   Pulse 84   Temp 98.1 F (36.7 C)   Resp 20   LMP 11/16/2012   SpO2 94%   Visual Acuity Right Eye Distance:   Left Eye Distance:   Bilateral Distance:    Right Eye Near:   Left Eye Near:    Bilateral Near:     Physical Exam Vitals and nursing note reviewed.  Constitutional:      Appearance: She is well-developed.     Comments: No acute distress  HENT:     Head: Normocephalic and atraumatic.     Nose: Nose normal.  Eyes:     Conjunctiva/sclera: Conjunctivae normal.  Cardiovascular:     Rate and Rhythm: Normal rate.  Pulmonary:     Effort: Pulmonary effort is normal. No respiratory distress.  Abdominal:     General: There is no distension.  Musculoskeletal:        General: Normal range of motion.     Cervical back: Neck supple.     Comments: Right ankle: No obvious swelling deformity or discoloration, nontender to palpation of distal fibula/lateral malleolus, tenderness just anterior to lateral malleolus extending slightly into proximal dorsum of foot, no medial malleolus tenderness, majority of dorsum of foot without tenderness, dorsalis pedis 2+  Skin:    General: Skin is warm and dry.  Neurological:     Mental Status: She is alert and oriented to person, place, and time.     UC Treatments / Results  Labs (all labs ordered are listed, but only abnormal results are displayed) Labs Reviewed - No data to display  EKG   Radiology No results found.  Procedures Procedures (including critical care time)  Medications Ordered in UC Medications - No data to  display  Initial Impression / Assessment and Plan / UC Course  I have reviewed the triage vital signs and the nursing notes.  Pertinent labs & imaging results that were available during my care of the patient were reviewed by  me and considered in my medical decision making (see chart for details).     Right ankle sprain-low suspicion of fraction given minimal bony tenderness, likely sprain of ATF, treating with Ace wrap, recommended rest ice elevation and anti-inflammatories with continued monitoring for gradual resolution.  Discussed strict return precautions. Patient verbalized understanding and is agreeable with plan.  Final Clinical Impressions(s) / UC Diagnoses   Final diagnoses:  Sprain of anterior talofibular ligament of right ankle, initial encounter     Discharge Instructions      Use anti-inflammatories for pain/swelling. You may take up to 800 mg Ibuprofen every 8 hours with food. You may supplement Ibuprofen with Tylenol [phone] mg every 8 hours.  Ace wrap for compression/support Ice and elevate Follow-up if not improving or worsening     ED Prescriptions     Medication Sig Dispense Auth. Provider   ibuprofen (ADVIL) 800 MG tablet Take 1 tablet (800 mg total) by mouth 3 (three) times daily. 21 tablet Christoph Copelan, [place] C, PA-C      PDMP not reviewed this encounter.   Sharyon Cable [place] C, [place] 03/04/21 [phone]

## 2021-04-30 ENCOUNTER — Other Ambulatory Visit (HOSPITAL_COMMUNITY): Payer: Self-pay | Admitting: Internal Medicine

## 2021-04-30 DIAGNOSIS — I5022 Chronic systolic (congestive) heart failure: Secondary | ICD-10-CM

## 2021-05-20 ENCOUNTER — Other Ambulatory Visit (HOSPITAL_COMMUNITY): Payer: Self-pay | Admitting: Cardiology

## 2021-06-11 ENCOUNTER — Other Ambulatory Visit (HOSPITAL_COMMUNITY): Payer: Self-pay | Admitting: Cardiology

## 2021-08-12 ENCOUNTER — Other Ambulatory Visit (HOSPITAL_COMMUNITY): Payer: Self-pay | Admitting: Internal Medicine

## 2021-09-11 ENCOUNTER — Other Ambulatory Visit (HOSPITAL_COMMUNITY): Payer: Self-pay | Admitting: Cardiology

## 2021-09-26 ENCOUNTER — Other Ambulatory Visit (HOSPITAL_COMMUNITY): Payer: Self-pay | Admitting: Internal Medicine

## 2021-10-11 ENCOUNTER — Other Ambulatory Visit (HOSPITAL_COMMUNITY): Payer: Self-pay | Admitting: Cardiology

## 2021-10-21 ENCOUNTER — Other Ambulatory Visit (HOSPITAL_COMMUNITY): Payer: Self-pay | Admitting: Cardiology

## 2021-11-03 ENCOUNTER — Other Ambulatory Visit (HOSPITAL_COMMUNITY): Payer: Self-pay | Admitting: *Deleted

## 2021-11-03 DIAGNOSIS — I5022 Chronic systolic (congestive) heart failure: Secondary | ICD-10-CM

## 2021-11-03 MED ORDER — ENTRESTO 49-51 MG PO TABS: 1.0000 | ORAL_TABLET | Freq: Two times a day (BID) | ORAL | 1 refills | Status: DC

## 2021-11-03 MED ORDER — SPIRONOLACTONE 25 MG PO TABS: 25.0000 mg | ORAL_TABLET | Freq: Every day | ORAL | 1 refills | Status: DC

## 2021-11-03 MED ORDER — HYDRALAZINE HCL 50 MG PO TABS: 50.0000 mg | ORAL_TABLET | Freq: Two times a day (BID) | ORAL | 1 refills | Status: DC

## 2021-11-03 MED ORDER — CARVEDILOL 6.25 MG PO TABS: 6.2500 mg | ORAL_TABLET | Freq: Two times a day (BID) | ORAL | 1 refills | Status: DC

## 2021-11-03 MED ORDER — ISOSORBIDE MONONITRATE ER 30 MG PO TB24: 30.0000 mg | ORAL_TABLET | Freq: Every day | ORAL | 1 refills | Status: DC

## 2022-01-06 ENCOUNTER — Ambulatory Visit (HOSPITAL_COMMUNITY)
Admission: RE | Admit: 2022-01-06 | Discharge: 2022-01-06 | Disposition: A | Discharge status: Home / Self Care | Payer: PRIVATE HEALTH INSURANCE | Source: Ambulatory Visit | Attending: Cardiology | Admitting: Cardiology

## 2022-01-06 VITALS — BP 124/84 | HR 63 | Wt 153.6 lb

## 2022-01-06 DIAGNOSIS — I5022 Chronic systolic (congestive) heart failure: Secondary | ICD-10-CM | POA: Diagnosis present

## 2022-01-06 DIAGNOSIS — F17200 Nicotine dependence, unspecified, uncomplicated: Secondary | ICD-10-CM | POA: Diagnosis not present

## 2022-01-06 DIAGNOSIS — I428 Other cardiomyopathies: Secondary | ICD-10-CM | POA: Insufficient documentation

## 2022-01-06 DIAGNOSIS — I110 Hypertensive heart disease with heart failure: Secondary | ICD-10-CM | POA: Diagnosis not present

## 2022-01-06 DIAGNOSIS — Z79899 Other long term (current) drug therapy: Secondary | ICD-10-CM | POA: Diagnosis not present

## 2022-01-06 LAB — TSH: TSH: 0.143 u[IU]/mL — ABNORMAL LOW (ref 0.350–4.500)

## 2022-01-06 LAB — BASIC METABOLIC PANEL
Anion gap: 7 (ref 5–15)
BUN: 11 mg/dL (ref 6–20)
CO2: 25 mmol/L (ref 22–32)
Calcium: 9.1 mg/dL (ref 8.9–10.3)
Chloride: 105 mmol/L (ref 98–111)
Creatinine, Ser: 0.79 mg/dL (ref 0.44–1.00)
GFR, Estimated: >60 mL/min (ref >60)
Glucose, Bld: 141 mg/dL — ABNORMAL HIGH (ref 70–99)
Potassium: 3.3 mmol/L — ABNORMAL LOW (ref 3.5–5.1)
Sodium: 137 mmol/L (ref 135–145)

## 2022-01-06 LAB — BRAIN NATRIURETIC PEPTIDE: B Natriuretic Peptide: 37 pg/mL (ref 0.0–100.0)

## 2022-01-06 LAB — CBC
HCT: 37 % (ref 36.0–46.0)
Hemoglobin: 12.8 g/dL (ref 12.0–15.0)
MCH: 31.1 pg (ref 26.0–34.0)
MCHC: 34.6 g/dL (ref 30.0–36.0)
MCV: 89.8 fL (ref 80.0–100.0)
Platelets: 319 10*3/uL (ref 150–400)
RBC: 4.12 MIL/uL (ref 3.87–5.11)
RDW: 13.5 % (ref 11.5–15.5)
WBC: 9.7 10*3/uL (ref 4.0–10.5)
nRBC: 0 % (ref 0.0–0.2)

## 2022-01-06 MED ORDER — ISOSORBIDE MONONITRATE ER 30 MG PO TB24: 30.0000 mg | ORAL_TABLET | Freq: Every day | ORAL | 3 refills | Status: DC

## 2022-01-06 MED ORDER — BUPROPION HCL ER (SR) 150 MG PO TB12: 150.0000 mg | ORAL_TABLET | Freq: Two times a day (BID) | ORAL | 3 refills | Status: DC

## 2022-01-06 MED ORDER — ENTRESTO 49-51 MG PO TABS: 1.0000 | ORAL_TABLET | Freq: Two times a day (BID) | ORAL | 3 refills | Status: DC

## 2022-01-06 MED ORDER — CARVEDILOL 6.25 MG PO TABS: 6.2500 mg | ORAL_TABLET | Freq: Two times a day (BID) | ORAL | 3 refills | Status: DC

## 2022-01-06 MED ORDER — HYDRALAZINE HCL 50 MG PO TABS: 50.0000 mg | ORAL_TABLET | Freq: Two times a day (BID) | ORAL | 3 refills | Status: DC

## 2022-01-06 MED ORDER — SPIRONOLACTONE 25 MG PO TABS: 25.0000 mg | ORAL_TABLET | Freq: Every day | ORAL | 3 refills | Status: DC

## 2022-01-06 NOTE — Patient Instructions (Addendum)
There has been no changes to your medications.  Labs done today, your results will be available in MyChart, we will contact you for abnormal readings.  Repeat blood work in 3 months   Your physician has requested that you have an echocardiogram. Echocardiography is a painless test that uses sound waves to create images of your heart. It provides your doctor with information about the size and shape of your heart and how well your heart's chambers and valves are working. This procedure takes approximately one hour. There are no restrictions for this procedure.  Your physician recommends that you schedule a follow-up appointment in: 1 year (May 2024) with an Echocardiogram ** PLEASE CALL THE OFFICE IN MARCH 2024 TO ARRANGE YOUR FOLLOW UP APPOINTMENT **  If you have any questions or concerns before your next appointment please send [place] a message through [place] or call our office at [phone].    TO LEAVE A MESSAGE FOR THE NURSE SELECT OPTION 2, PLEASE LEAVE A MESSAGE INCLUDING: YOUR NAME DATE OF BIRTH CALL BACK NUMBER REASON FOR CALL**this is important as we prioritize the call backs  YOU WILL RECEIVE A CALL BACK THE SAME DAY AS LONG AS YOU CALL BEFORE 4:00 PM  At the [facility], you and your health needs are our priority. As part of our continuing mission to provide you with exceptional heart care, we have created designated Provider Care Teams. These Care Teams include your primary Cardiologist (physician) and Advanced Practice Providers (APPs- Physician Assistants and Nurse Practitioners) who all work together to provide you with the care you need, when you need it.   You may see any of the following providers on your designated Care Team at your next follow up: Dr Arvilla Meres Dr Carron Curie, NP Robbie Lis, [place] [facility] [place], [place] Karle Plumber, PharmD   Please be sure to bring in all your medications bottles to every  appointment.

## 2022-01-06 NOTE — Progress Notes (Signed)
Patient ID: Kim Underwood, female   DOB: 06-12-1966, 56 y.o.   MRN: [id number]    [facility] Note   PCP: Copland, Gwenlyn Found, MD HF Cardiology: Dr Shirlee Latch  HPI:  Kim Underwood is a 56 y.o. female with a history of NICM, chronic systolic heart failure, HTN, and tobacco abuse.   Her EF was as low at 15% in 2014. This has improved. Last echo in 4/22 showed EF 50-55%.  LHC in 7/14 showed no significant CAD.   She returns for followup of CHF.  She works in housekeeping, has a busy job.  No exertional dyspnea.  No chest pain. No lightheadedness.  Taking all her meds.  She is still smoking.    ECG (personally reviewed): NSR, nonspecific T wave flattening.   Labs (9/22): LDL 119, K 3.8, creatinine 0.69  PMH: 1. Smoking 2. Chronic systolic CHF: Nonischemic cardiomyopathy.  - LHC (7/14): No significant CAD.  - Echo (4/14): EF 15%  - Echo (5/15): EF 35-40%  - Echo (11/16): EF 55-60%, mild MR, mod LAE, Mild TR, Normal RV - Echo (12/18): EF 45-50%.  - Echo (7/20): EF 45-50%   - Echo (4/22): EF 50-55%, normal RV 3. HTN  SH: Lives alone. Current smoker. Works full time at [employer]   FH: Both her parents had heart failure. Mom had stents  Review of systems complete and found to be negative unless listed in HPI.   Current Outpatient Medications  Medication Sig Dispense Refill   acetaminophen (TYLENOL) 325 MG tablet Take 650 mg by mouth every 4 (four) hours as needed for moderate pain.     buPROPion (WELLBUTRIN SR) 150 MG 12 hr tablet Take 1 tablet (150 mg total) by mouth 2 (two) times daily. 180 tablet 3   carvedilol (COREG) 6.25 MG tablet Take 1 tablet (6.25 mg total) by mouth 2 (two) times daily with a meal. 180 tablet 3   hydrALAZINE (APRESOLINE) 50 MG tablet Take 1 tablet (50 mg total) by mouth 2 (two) times daily. 180 tablet 3   isosorbide mononitrate (IMDUR) 30 MG 24 hr tablet Take 1 tablet (30 mg total) by mouth daily. 90 tablet 3    sacubitril-valsartan (ENTRESTO) 49-51 MG Take 1 tablet by mouth 2 (two) times daily. 180 tablet 3   spironolactone (ALDACTONE) 25 MG tablet Take 1 tablet (25 mg total) by mouth daily. 90 tablet 3   No current facility-administered medications for this encounter.    Vitals:   01/06/22 1033  BP: 124/84  Pulse: 63  SpO2: 99%  Weight: 69.7 kg (153 lb 9.6 oz)    Wt Readings from Last 3 Encounters:  01/06/22 69.7 kg (153 lb 9.6 oz)  11/26/20 71 kg (156 lb 9.6 oz)  02/20/20 70.6 kg (155 lb 9.6 oz)    PHYSICAL EXAM: General: NAD Neck: No JVD, no thyromegaly or thyroid nodule.  Lungs: Clear to auscultation bilaterally with normal respiratory effort. CV: Nondisplaced PMI.  Heart regular S1/S2, no S3/S4, no murmur.  No peripheral edema.  No carotid bruit.  Normal pedal pulses.  Abdomen: Soft, nontender, no hepatosplenomegaly, no distention.  Skin: Intact without lesions or rashes.  Neurologic: Alert and oriented x 3.  Psych: Normal affect. Extremities: No clubbing or cyanosis.  HEENT: Normal.   ASSESSMENT & PLAN:  1. Chronic systolic CHF: Nonischemic cardiomyopathy.  LHC in 7/14 with no significant CAD.  EF as low as 15% in the past.  Most recent echo in 4/22 with EF  up to 50-55%. She is not volume overloaded on exam, NYHA class I.  - Continue Coreg 6.25 mg bid.  - Continue hydralazine 50 mg BID (cannot take TID due to her job).  - Continue Imdur 30 mg daily.  - Continue Entresto 49-51 mg bid.  - Continue spironolactone 25 mg daily. BMET today.  - Arrange for echo.  I will not change meds unless echo shows decreased EF.  2. HTN:  BP controlled.  3. Smoking: Still smoking.  Wellbutrin worked in the past.  - She will try Wellbutrin again.   If echo shows EF normal to mildly reduced, she will followup in 1 year.  She should get BMETs every 3 months.    Kim Underwood 01/06/2022

## 2022-01-09 ENCOUNTER — Telehealth (HOSPITAL_COMMUNITY): Payer: Self-pay | Admitting: Surgery

## 2022-01-09 DIAGNOSIS — E0590 Thyrotoxicosis, unspecified without thyrotoxic crisis or storm: Secondary | ICD-10-CM

## 2022-01-09 NOTE — Telephone Encounter (Signed)
Patient called and results reviewed.  Blood sample cannot be sent for further testing at this point.  She is agreeable to referral to endocrinology and referral sent to that practice.

## 2022-01-09 NOTE — Telephone Encounter (Signed)
-----   Message from Larey Dresser, MD sent at 01/07/2022  4:48 PM EDT ----- TSH low, suggests hyperthyroidism.  Send Free T3 and free T4.  Refer to endocrinology ([facility])

## 2022-01-14 ENCOUNTER — Other Ambulatory Visit (HOSPITAL_COMMUNITY): Payer: Self-pay

## 2022-01-14 ENCOUNTER — Ambulatory Visit (HOSPITAL_COMMUNITY)
Admission: RE | Admit: 2022-01-14 | Discharge: 2022-01-14 | Disposition: A | Discharge status: Home / Self Care | Payer: PRIVATE HEALTH INSURANCE | Source: Ambulatory Visit | Attending: Internal Medicine | Admitting: Internal Medicine

## 2022-01-14 DIAGNOSIS — E0590 Thyrotoxicosis, unspecified without thyrotoxic crisis or storm: Secondary | ICD-10-CM

## 2022-01-14 LAB — T4, FREE: Free T4: 1.02 ng/dL (ref 0.61–1.12)

## 2022-01-15 LAB — T3, FREE: T3, Free: 3.3 pg/mL (ref 2.0–4.4)

## 2022-03-23 ENCOUNTER — Other Ambulatory Visit (HOSPITAL_COMMUNITY): Payer: No Typology Code available for payment source

## 2022-03-26 ENCOUNTER — Ambulatory Visit (HOSPITAL_COMMUNITY)
Admission: RE | Admit: 2022-03-26 | Discharge: 2022-03-26 | Disposition: A | Discharge status: Home / Self Care | Payer: No Typology Code available for payment source | Source: Ambulatory Visit | Attending: Internal Medicine | Admitting: Internal Medicine

## 2022-03-26 DIAGNOSIS — I5022 Chronic systolic (congestive) heart failure: Secondary | ICD-10-CM | POA: Insufficient documentation

## 2022-03-26 LAB — BASIC METABOLIC PANEL
Anion gap: 8 (ref 5–15)
BUN: 13 mg/dL (ref 6–20)
CO2: 24 mmol/L (ref 22–32)
Calcium: 8.9 mg/dL (ref 8.9–10.3)
Chloride: 106 mmol/L (ref 98–111)
Creatinine, Ser: 0.76 mg/dL (ref 0.44–1.00)
GFR, Estimated: >60 mL/min (ref >60)
Glucose, Bld: 138 mg/dL — ABNORMAL HIGH (ref 70–99)
Potassium: 3.7 mmol/L (ref 3.5–5.1)
Sodium: 138 mmol/L (ref 135–145)

## 2022-08-07 ENCOUNTER — Ambulatory Visit
Admission: EM | Admit: 2022-08-07 | Discharge: 2022-08-07 | Disposition: A | Discharge status: Home / Self Care | Payer: No Typology Code available for payment source | Attending: Urgent Care | Admitting: Urgent Care

## 2022-08-07 DIAGNOSIS — I10 Essential (primary) hypertension: Secondary | ICD-10-CM | POA: Diagnosis not present

## 2022-08-07 DIAGNOSIS — F17200 Nicotine dependence, unspecified, uncomplicated: Secondary | ICD-10-CM | POA: Diagnosis not present

## 2022-08-07 DIAGNOSIS — B349 Viral infection, unspecified: Secondary | ICD-10-CM | POA: Diagnosis not present

## 2022-08-07 DIAGNOSIS — I509 Heart failure, unspecified: Secondary | ICD-10-CM

## 2022-08-07 MED ORDER — PROMETHAZINE-DM 6.25-15 MG/5ML PO SYRP: 2.5000 mL | ORAL_SOLUTION | Freq: Three times a day (TID) | ORAL | 0 refills | Status: DC | PRN

## 2022-08-07 MED ORDER — PREDNISONE 10 MG PO TABS
30.0000 mg | ORAL_TABLET | Freq: Every day | ORAL | 0 refills | Status: DC
Start: 2022-08-07 — End: 2023-10-31

## 2022-08-07 MED ORDER — CETIRIZINE HCL 10 MG PO TABS: 10.0000 mg | ORAL_TABLET | Freq: Every day | ORAL | 0 refills | Status: DC

## 2022-08-07 MED ORDER — BENZONATATE 100 MG PO CAPS: 100.0000 mg | ORAL_CAPSULE | Freq: Three times a day (TID) | ORAL | 0 refills | Status: DC | PRN

## 2022-08-07 NOTE — ED Triage Notes (Signed)
Pt presents with complaints of cough x 3 days. Reports some mucus but denies other symptoms. Cough is worse at night. Works around children.

## 2022-08-07 NOTE — Discharge Instructions (Signed)
We will manage this as a viral syndrome. For sore throat or cough try using a honey-based tea. Use 3 teaspoons of honey with juice squeezed from half lemon. Place shaved pieces of ginger into 1/2-1 cup of water and warm over stove top. Then mix the ingredients and repeat every 4 hours as needed. Please take Tylenol 500mg -650mg  once every 6 hours for fevers, aches and pains. Hydrate very well with at least 2 liters (64 ounces) of water. Eat light meals such as soups (chicken and noodles, chicken wild rice, vegetable).  Do not eat any foods that you are allergic to.  Start an antihistamine like Zyrtec for postnasal drainage, sinus congestion.  You can take this together with prednisone and the cough syrup, cough capsules.

## 2022-08-07 NOTE — ED Provider Notes (Signed)
[facility]  Note:  This document was prepared using Conservation officer, historic buildings and may include unintentional dictation errors.  MRN: [id number] DOB: [birth year]  Subjective:   Kim Underwood is a 56 y.o. female presenting for 3-day history of coughing, chest tightness, sinus drainage.  Has been using multiple over-the-counter measures with good relief.  Reports that she was worse yesterday but wanted to make sure she got checked.  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  Smokes 2-3 cigarettes per day.  Is working on quitting.  No overt chest pain, shortness of breath or wheezing.  No fevers or body pains, sinus pain, ear pain, throat pain.  No current facility-administered medications for this encounter.  Current Outpatient Medications:    acetaminophen (TYLENOL) 325 MG tablet, Take 650 mg by mouth every 4 (four) hours as needed for moderate pain., Disp: , Rfl:    buPROPion (WELLBUTRIN SR) 150 MG 12 hr tablet, Take 1 tablet (150 mg total) by mouth 2 (two) times daily., Disp: 180 tablet, Rfl: 3   carvedilol (COREG) 6.25 MG tablet, Take 1 tablet (6.25 mg total) by mouth 2 (two) times daily with a meal., Disp: 180 tablet, Rfl: 3   hydrALAZINE (APRESOLINE) 50 MG tablet, Take 1 tablet (50 mg total) by mouth 2 (two) times daily., Disp: 180 tablet, Rfl: 3   isosorbide mononitrate (IMDUR) 30 MG 24 hr tablet, Take 1 tablet (30 mg total) by mouth daily., Disp: 90 tablet, Rfl: 3   sacubitril-valsartan (ENTRESTO) 49-51 MG, Take 1 tablet by mouth 2 (two) times daily., Disp: 180 tablet, Rfl: 3   spironolactone (ALDACTONE) 25 MG tablet, Take 1 tablet (25 mg total) by mouth daily., Disp: 90 tablet, Rfl: 3   No Known Allergies  Past Medical History:  Diagnosis Date   CHF (congestive heart failure) (HCC)    Chronic systolic heart failure (HCC)    a. NICM b. ECHO (11/2012): EF 15-20%, Diff HK, grade IV  DD, mod MR, LA mildly dilated c. RHC (02/2013): RA: 3, RV: 35/7, PA: 32/11 (19), PCWP: 6, PA sat 69%, Fick CO/CI: 4.6 / 2.8 d. ECHO (12/2013): EF 35-40%, diff HK, grade I DD, trivial MR, LA mildly dialted, RV nl   Hypertension, malignant    NICM (nonischemic cardiomyopathy) (HCC)    LHC (02/2013): Lmain: no sig. dz, LAD: luminal irregularities, LCx: luminal irregularities, co-dominant, RCA: luminal irregularities, rel small, co-dominant with LCx   Tobacco abuse      Past Surgical History:  Procedure Laterality Date   TUBAL LIGATION  2000    Family History  Problem Relation Age of Onset   Stroke Mother    Heart disease Mother    Heart disease Father     Social History   Tobacco Use   Smoking status: Every Day    Packs/day: 0.50    Types: Cigarettes   Smokeless tobacco: Never  Substance Use Topics   Alcohol use: Yes    Comment: very rarely   Drug use: No    ROS   Objective:   Vitals: BP 132/77 (BP Location: Left Arm)   Pulse 68   Temp 98 F (36.7 C) (Oral)   Resp 18   LMP 11/16/2012   SpO2 97%   Physical Exam Constitutional:      General: She is not in acute distress.    Appearance: Normal appearance. She is well-developed. She  is not ill-appearing, toxic-appearing or diaphoretic.  HENT:     Head: Normocephalic and atraumatic.     Right Ear: External ear normal.     Left Ear: External ear normal.     Nose: Congestion present. No rhinorrhea.     Mouth/Throat:     Mouth: Mucous membranes are moist.     Pharynx: No pharyngeal swelling, oropharyngeal exudate, posterior oropharyngeal erythema or uvula swelling.     Tonsils: No tonsillar exudate or tonsillar abscesses. 0 on the right. 0 on the left.     Comments: Mild postnasal drainage overlying pharynx. Eyes:     General: No scleral icterus.       Right eye: No discharge.        Left eye: No discharge.     Extraocular Movements: Extraocular movements intact.  Cardiovascular:     Rate and Rhythm: Normal rate  and regular rhythm.     Heart sounds: Normal heart sounds. No murmur heard.    No friction rub. No gallop.  Pulmonary:     Effort: Pulmonary effort is normal. No respiratory distress.     Breath sounds: No stridor. No wheezing, rhonchi or rales.  Chest:     Chest wall: No tenderness.  Skin:    General: Skin is warm and dry.  Neurological:     General: No focal deficit present.     Mental Status: She is alert and oriented to person, place, and time.  Psychiatric:        Mood and Affect: Mood normal.        Behavior: Behavior normal.        Thought Content: Thought content normal.        Judgment: Judgment normal.     Assessment and Plan :   PDMP not reviewed this encounter.  1. Acute viral syndrome   2. Congestive heart failure, unspecified HF chronicity, unspecified heart failure type (HCC)   3. Smoker   4. Essential hypertension     Deferred imaging given clear cardiopulmonary exam, hemodynamically stable vital signs.  In the context of her smoking and respiratory symptoms, offered her a lower dose of prednisone.  Her last ejection fraction for her congestive heart failure was 50 to 55%.  Deferred respiratory testing, patient agreed.  Otherwise, suspect viral URI, viral syndrome. Physical exam findings reassuring and vital signs stable for discharge. Advised supportive care, offered symptomatic relief. Counseled patient on potential for adverse effects with medications prescribed/recommended today, ER and return-to-clinic precautions discussed, patient verbalized understanding.     Wallis Bamberg, [place] 08/07/22 [phone]

## 2022-10-05 ENCOUNTER — Telehealth (HOSPITAL_COMMUNITY): Payer: Self-pay

## 2022-10-05 ENCOUNTER — Other Ambulatory Visit (HOSPITAL_COMMUNITY): Payer: Self-pay

## 2022-10-05 MED ORDER — ENTRESTO 49-51 MG PO TABS: 1.0000 | ORAL_TABLET | Freq: Two times a day (BID) | ORAL | 3 refills | Status: DC

## 2022-10-05 NOTE — Telephone Encounter (Signed)
[  Start new [employer] forms for entresto]

## 2022-10-05 NOTE — Telephone Encounter (Signed)
Advanced [facility] Patient Advocate Encounter  Medication Samples have been left at registration desk for patient pick up. Drug name: Delene Loll 49-51MG Qty: 2x 28 ct package LOT: [id number] Exp.: 06/2024 SIG: Take 1 tablet by mouth twice daily   The patient has been instructed regarding the correct time, dose, and frequency of taking this medication, including desired effects and most common side effects.

## 2022-10-06 ENCOUNTER — Ambulatory Visit: Payer: No Typology Code available for payment source | Admitting: Internal Medicine

## 2022-10-07 ENCOUNTER — Other Ambulatory Visit (HOSPITAL_COMMUNITY): Payer: Self-pay

## 2022-10-15 NOTE — Telephone Encounter (Signed)
Advanced [facility] Patient Advocate Encounter  [employer] is requesting POI before determination can be made. Left voicemail for patient

## 2022-10-16 ENCOUNTER — Other Ambulatory Visit (HOSPITAL_COMMUNITY): Payer: Self-pay

## 2022-10-21 NOTE — Telephone Encounter (Signed)
Advanced [facility] Patient Advocate Encounter  Patient was approved to receive Entresto from [employer] Effective 10/21/22 to 10/21/23  Called to inform patient, and provided the contact number for [employer].  Clista Bernhardt, CPhT Rx Patient Advocate Phone: [phone]

## 2023-02-22 ENCOUNTER — Other Ambulatory Visit (HOSPITAL_COMMUNITY): Payer: Self-pay | Admitting: Cardiology

## 2023-02-22 DIAGNOSIS — I5022 Chronic systolic (congestive) heart failure: Secondary | ICD-10-CM

## 2023-02-24 ENCOUNTER — Other Ambulatory Visit (HOSPITAL_COMMUNITY): Payer: Self-pay | Admitting: Cardiology

## 2023-05-20 ENCOUNTER — Other Ambulatory Visit (HOSPITAL_COMMUNITY): Payer: Self-pay | Admitting: Cardiology

## 2023-05-20 DIAGNOSIS — I5022 Chronic systolic (congestive) heart failure: Secondary | ICD-10-CM

## 2023-06-09 ENCOUNTER — Ambulatory Visit: Payer: No Typology Code available for payment source | Admitting: Internal Medicine

## 2023-06-12 ENCOUNTER — Other Ambulatory Visit (HOSPITAL_COMMUNITY): Payer: Self-pay | Admitting: Cardiology

## 2023-08-31 ENCOUNTER — Other Ambulatory Visit (HOSPITAL_COMMUNITY): Payer: Self-pay | Admitting: Cardiology

## 2023-09-28 NOTE — Progress Notes (Unsigned)
Name: Kim Underwood  MRN/ DOB: [id number], 1965-12-22    Age/ Sex: 58 y.o., female    PCP: Patient, No Pcp Per   Reason for Endocrinology Evaluation: Subclinical Hyperthyroidism     Date of Initial Endocrinology Evaluation: 09/29/2023     HPI: [name] is a 58 y.o. female with a past medical history of CHF, HTN. The patient presented for initial endocrinology clinic visit on 09/29/2023 for consultative assistance with her Subclinical Hyperthyroidism.   Pt has been noted with low TSH since 2014 with a nadir of 0.143 uIU/mL in 12/2021 ,with normal FT4 and FT3.   TSI level was normal at 76% ( Reference >140%)   Thyroid uptake and scan 09/2013 showed normal uptake of 20% with cold and warm nodules noted.  Has noted unintentional weight loss  Denies local neck swelling  Has minimal palpitations, no Hx of A. Fib  Denies tremors  Denies Loose stools or diarrhea Denies anxiety or jittery sensation  Has insomnia   Mother and GM with thyroid disease      HISTORY:  Past Medical History:  Past Medical History:  Diagnosis Date   CHF (congestive heart failure) (HCC)    Chronic systolic heart failure (HCC)    a. NICM b. ECHO (11/2012): EF 15-20%, Diff HK, grade IV DD, mod MR, LA mildly dilated c. RHC (02/2013): RA: 3, RV: 35/7, PA: 32/11 (19), PCWP: 6, PA sat 69%, Fick CO/CI: 4.6 / 2.8 d. ECHO (12/2013): EF 35-40%, diff HK, grade I DD, trivial MR, LA mildly dialted, RV nl   Hypertension, malignant    NICM (nonischemic cardiomyopathy) (HCC)    LHC (02/2013): Lmain: no sig. dz, LAD: luminal irregularities, LCx: luminal irregularities, co-dominant, RCA: luminal irregularities, rel small, co-dominant with LCx   Tobacco abuse    Past Surgical History:  Past Surgical History:  Procedure Laterality Date   TUBAL LIGATION  2000    Social History:  reports that she has been smoking cigarettes. She has never used smokeless tobacco. She reports current alcohol use. She reports  that she does not use drugs. Family History: family history includes Heart disease in her father and mother; Stroke in her mother.   HOME MEDICATIONS: Allergies as of 09/29/2023   No Known Allergies      Medication List        Accurate as of September 29, 2023  8:15 AM. If you have any questions, ask your nurse or doctor.          STOP taking these medications    benzonatate 100 MG capsule Commonly known as: TESSALON Stopped by: Johnney Ou Sibyl Mikula       TAKE these medications    acetaminophen 325 MG tablet Commonly known as: TYLENOL Take 650 mg by mouth every 4 (four) hours as needed for moderate pain.   buPROPion 150 MG 12 hr tablet Commonly known as: WELLBUTRIN SR Take 1 tablet (150 mg total) by mouth 2 (two) times daily.   carvedilol 6.25 MG tablet Commonly known as: COREG TAKE 1 TABLET BY MOUTH TWICE DAILY WITH MEALS . APPOINTMENT REQUIRED FOR FUTURE REFILLS   cetirizine 10 MG tablet Commonly known as: ZyrTEC Allergy Take 1 tablet (10 mg total) by mouth daily.   Entresto 49-51 MG Generic drug: sacubitril-valsartan Take 1 tablet by mouth 2 (two) times daily.   hydrALAZINE 50 MG tablet Commonly known as: APRESOLINE Take 1 tablet (50 mg total) by mouth 2 (two) times daily. NEEDS FOLLOW UP  APPOINTMENT FOR MORE REFILLS   isosorbide mononitrate 30 MG 24 hr tablet Commonly known as: IMDUR Take 1 tablet (30 mg total) by mouth daily. NEEDS FOLLOW UP APPOINTMENT FOR MORE REFILLS   predniSONE 10 MG tablet Commonly known as: DELTASONE Take 3 tablets (30 mg total) by mouth daily with breakfast.   promethazine-dextromethorphan 6.25-15 MG/5ML syrup Commonly known as: PROMETHAZINE-DM Take 2.5 mLs by mouth 3 (three) times daily as needed for cough.   spironolactone 25 MG tablet Commonly known as: ALDACTONE Take 1 tablet by mouth once daily          REVIEW OF SYSTEMS: A comprehensive ROS was conducted with the patient and is negative except      OBJECTIVE:  VS: BP 122/80 (BP Location: Left Arm, Patient Position: Sitting, Cuff Size: Normal)   Pulse 74   Ht 5' (1.524 m)   Wt 154 lb 9.6 oz (70.1 kg)   LMP 11/16/2012   SpO2 98%   BMI 30.19 kg/m    Wt Readings from Last 3 Encounters:  09/29/23 154 lb 9.6 oz (70.1 kg)  01/06/22 153 lb 9.6 oz (69.7 kg)  11/26/20 156 lb 9.6 oz (71 kg)     EXAM: General: Pt appears well and is in NAD  Eyes: External eye exam normal without stare, lid lag or exophthalmos.  EOM intact.  PERRL.  Neck: General: Supple without adenopathy. Thyroid: Thyroid size normal.  No goiter or nodules appreciated. No thyroid bruit.  Lungs: Clear with good BS bilat   Heart: Auscultation: RRR.  Abdomen: Soft, nontender  Extremities:  BL LE: No pretibial edema   Mental Status: Judgment, insight: Intact Orientation: Oriented to time, place, and person Mood and affect: No depression, anxiety, or agitation     DATA REVIEWED: ***    Thyroid uptake and scan 09/26/2013  FINDINGS:  Normal 24 hr radio iodine uptake of 20%.   Images of the thyroid gland in 3 projections demonstrate slight  enlargement of the inferior left lobe versus right.   Questionable cold nodule is seen laterally at the inferior pole left  lobe.   Additionally, a an approximate 1 cm diameter nodule is identified at  the upper pole of the right lobe.   Question small subtle warm nodule inferior pole right lobe.   IMPRESSION:  Normal 24 hr radio iodine after 20%.   Potential cold nodules at the inferior pole left lobe in upper pole  right lobe as well as a potential warm nodule at the inferior pole  of the right lobe.    ASSESSMENT/PLAN/RECOMMENDATIONS:   Subclinical Hyperthyroidism:  -The causes of subclinical hyperthyroidism are the same as the causes of overt hyperthyroidism, and like overt hyperthyroidism, subclinical hyperthyroidism can be persistent or transient. Common causes of subclinical hyperthyroidism include  autonomously functioning thyroid adenomas and multinodular goiters , or Graves' disease    -Most patients with subclinical hyperthyroidism have no clinical manifestations of hyperthyroidism, and those symptoms that are present (eg, tachycardia, tremor, dyspnea on exertion, weight loss) are mild and nonspecific." However, subclinical hyperthyroidism is associated with an increased risk of atrial fibrillation and, primarily in postmenopausal women, a decrease in bone mineral density.  -Given the chronicity of her condition as well as CHF diagnosis, I have recommended proceeding with treatment   We discussed that Graves' Disease is a result of an autoimmune condition involving the thyroid.    We discussed with pt the benefits of methimazole in the Tx of hyperthyroidism, as well as the possible side  effects/complications of anti-thyroid drug Tx (specifically detailing the rare, but serious side effect of agranulocytosis). She was informed of need for regular thyroid function monitoring while on methimazole to ensure appropriate dosage without over-treatment. As well, we discussed the possible side effects of methimazole including the chance of rash, the small chance of liver irritation/juandice and the <=1 in 300-400 chance of sudden onset agranulocytosis.  We discussed importance of going to ED promptly (and stopping methimazole) if shewere to develop significant fever with severe sore throat of other evidence of acute infection.     We extensively discussed the various treatment options for hyperthyroidism and Graves disease including ablation therapy with radioactive iodine versus antithyroid drug treatment versus surgical therapy.  We recommended to the patient that we felt, at this time, that thionamide therapy would be most optimal.  We discussed the various possible benefits versus side effects of the various therapies.    Medications :   Follow-up in 3 months  Signed electronically by: Lyndle Herrlich, MD  [facility] Endocrinology  [facility] [address] [place], [place] [address] Phone: [phone] FAX: [phone]   CC: Patient, No Pcp Per No address on file Phone: None Fax: None   Return to Endocrinology clinic as below: Future Appointments  Date Time Provider Department Center  09/29/2023  8:30 AM Lawayne Hartig, Konrad Dolores, MD [id number] None

## 2023-09-29 ENCOUNTER — Ambulatory Visit: Payer: 59 | Admitting: Internal Medicine

## 2023-09-29 ENCOUNTER — Encounter: Payer: Self-pay | Admitting: Internal Medicine

## 2023-09-29 VITALS — BP 122/80 | HR 74 | Ht 60.0 in | Wt 154.6 lb

## 2023-09-29 DIAGNOSIS — E0590 Thyrotoxicosis, unspecified without thyrotoxic crisis or storm: Secondary | ICD-10-CM

## 2023-09-29 DIAGNOSIS — E042 Nontoxic multinodular goiter: Secondary | ICD-10-CM | POA: Diagnosis not present

## 2023-09-30 ENCOUNTER — Telehealth: Payer: Self-pay | Admitting: Internal Medicine

## 2023-09-30 ENCOUNTER — Encounter: Payer: Self-pay | Admitting: Internal Medicine

## 2023-09-30 MED ORDER — METHIMAZOLE 5 MG PO TABS: 5.0000 mg | ORAL_TABLET | Freq: Every day | ORAL | 3 refills | Status: DC

## 2023-09-30 NOTE — Telephone Encounter (Signed)
Attempted to contact the pt 09/30/2023 to discuss hyperthyroidism but the voice mail was full .     A prescription of methimazole will be sent to the pharmacy and a letter will be sent as well

## 2023-10-02 LAB — TRAB (TSH RECEPTOR BINDING ANTIBODY): TRAB: <1 [IU]/L (ref <=2.00)

## 2023-10-02 LAB — HEPATIC FUNCTION PANEL
AG Ratio: 1.4 (calc) (ref 1.0–2.5)
ALT: 30 U/L — ABNORMAL HIGH (ref 6–29)
AST: 33 U/L (ref 10–35)
Albumin: 4.1 g/dL (ref 3.6–5.1)
Alkaline phosphatase (APISO): 153 U/L (ref 37–153)
Bilirubin, Direct: 0.1 mg/dL (ref 0.0–0.2)
Globulin: 2.9 g/dL (ref 1.9–3.7)
Indirect Bilirubin: 0.3 mg/dL (ref 0.2–1.2)
Total Bilirubin: 0.4 mg/dL (ref 0.2–1.2)
Total Protein: 7 g/dL (ref 6.1–8.1)

## 2023-10-02 LAB — CBC WITH DIFFERENTIAL/PLATELET
Absolute Lymphocytes: 1882 {cells}/uL (ref 850–3900)
Absolute Monocytes: 630 {cells}/uL (ref 200–950)
Basophils Absolute: 34 {cells}/uL (ref 0–200)
Basophils Relative: 0.4 %
Eosinophils Absolute: 34 {cells}/uL (ref 15–500)
Eosinophils Relative: 0.4 %
HCT: 34.8 % — ABNORMAL LOW (ref 35.0–45.0)
Hemoglobin: 11.6 g/dL — ABNORMAL LOW (ref 11.7–15.5)
MCH: 29.7 pg (ref 27.0–33.0)
MCHC: 33.3 g/dL (ref 32.0–36.0)
MCV: 89 fL (ref 80.0–100.0)
MPV: 9.5 fL (ref 7.5–12.5)
Monocytes Relative: 7.5 %
Neutro Abs: 5821 {cells}/uL (ref 1500–7800)
Neutrophils Relative %: 69.3 %
Platelets: 265 10*3/uL (ref 140–400)
RBC: 3.91 10*6/uL (ref 3.80–5.10)
RDW: 13 % (ref 11.0–15.0)
Total Lymphocyte: 22.4 %
WBC: 8.4 10*3/uL (ref 3.8–10.8)

## 2023-10-02 LAB — T4, FREE: Free T4: 1.2 ng/dL (ref 0.8–1.8)

## 2023-10-02 LAB — T3, FREE: T3, Free: 3.4 pg/mL (ref 2.3–4.2)

## 2023-10-02 LAB — TSH: TSH: 0.25 m[IU]/L — ABNORMAL LOW (ref 0.40–4.50)

## 2023-10-12 ENCOUNTER — Ambulatory Visit
Admission: RE | Admit: 2023-10-12 | Discharge: 2023-10-12 | Disposition: A | Discharge status: Home / Self Care | Payer: 59 | Source: Ambulatory Visit | Attending: Internal Medicine | Admitting: Internal Medicine

## 2023-10-12 DIAGNOSIS — E0590 Thyrotoxicosis, unspecified without thyrotoxic crisis or storm: Secondary | ICD-10-CM

## 2023-10-14 ENCOUNTER — Encounter: Payer: Self-pay | Admitting: Internal Medicine

## 2023-10-19 ENCOUNTER — Telehealth: Payer: Self-pay | Admitting: Internal Medicine

## 2023-10-19 MED ORDER — PROPYLTHIOURACIL 50 MG PO TABS: 50.0000 mg | ORAL_TABLET | Freq: Every day | ORAL | 11 refills | Status: DC

## 2023-10-19 NOTE — Telephone Encounter (Signed)
Patient notified and will pick up new medication.  

## 2023-10-19 NOTE — Telephone Encounter (Signed)
 Patient is calling to say that since she has been taking methimazole methimazole (TAPAZOLE) 5 MG tablet That she has been having nausea, dry mouth, and no appetite.  Patient states that she has tried ginger ale and other things to stop the nausea, but nothing has helped.  Patient states that she still uses   [id number] - [place], [place] - [address] (Ph: [phone])   If something else needs to be sent in.

## 2023-10-26 ENCOUNTER — Ambulatory Visit (INDEPENDENT_AMBULATORY_CARE_PROVIDER_SITE_OTHER)

## 2023-10-26 ENCOUNTER — Ambulatory Visit
Admission: EM | Admit: 2023-10-26 | Discharge: 2023-10-26 | Disposition: A | Discharge status: Home / Self Care | Attending: Family Medicine | Admitting: Family Medicine

## 2023-10-26 DIAGNOSIS — R140 Abdominal distension (gaseous): Secondary | ICD-10-CM

## 2023-10-26 NOTE — ED Provider Notes (Signed)
 [facility]  Note:  This document was prepared using Conservation officer, historic buildings and may include unintentional dictation errors.  MRN: [id number] DOB: [birth year]  Subjective:   Kim Underwood is a 58 y.o. female presenting for 5-day history of abdominal bloating, abdominal fullness mostly after eating or drinking.  As such has had a decreased appetite.  Has not been able to burp.  Has had stools but has been small, reports that she feels that is the equivalent of what she has been eating which is very little.  No fever, vomiting, bloody stools.  No history of GI disorders.  No unexplained night sweats or weight loss.  Has never had a colonoscopy or diagnosis GI disorder.  No current facility-administered medications for this encounter.  Current Outpatient Medications:    acetaminophen (TYLENOL) 325 MG tablet, Take 650 mg by mouth every 4 (four) hours as needed for moderate pain., Disp: , Rfl:    buPROPion (WELLBUTRIN SR) 150 MG 12 hr tablet, Take 1 tablet (150 mg total) by mouth 2 (two) times daily. (Patient not taking: Reported on 09/29/2023), Disp: 180 tablet, Rfl: 3   carvedilol (COREG) 6.25 MG tablet, TAKE 1 TABLET BY MOUTH TWICE DAILY WITH MEALS . APPOINTMENT REQUIRED FOR FUTURE REFILLS, Disp: 90 tablet, Rfl: 0   cetirizine (ZYRTEC ALLERGY) 10 MG tablet, Take 1 tablet (10 mg total) by mouth daily., Disp: 30 tablet, Rfl: 0   hydrALAZINE (APRESOLINE) 50 MG tablet, Take 1 tablet (50 mg total) by mouth 2 (two) times daily. NEEDS FOLLOW UP APPOINTMENT FOR MORE REFILLS, Disp: 180 tablet, Rfl: 0   isosorbide mononitrate (IMDUR) 30 MG 24 hr tablet, Take 1 tablet (30 mg total) by mouth daily. NEEDS FOLLOW UP APPOINTMENT FOR MORE REFILLS, Disp: 90 tablet, Rfl: 0   predniSONE (DELTASONE) 10 MG tablet, Take 3 tablets (30 mg total) by mouth daily with breakfast. (Patient not taking: Reported on 09/29/2023), Disp: 15 tablet, Rfl: 0   promethazine-dextromethorphan  (PROMETHAZINE-DM) 6.25-15 MG/5ML syrup, Take 2.5 mLs by mouth 3 (three) times daily as needed for cough. (Patient not taking: Reported on 09/29/2023), Disp: 100 mL, Rfl: 0   propylthiouracil (PTU) 50 MG tablet, Take 1 tablet (50 mg total) by mouth daily in the afternoon., Disp: 30 tablet, Rfl: 11   sacubitril-valsartan (ENTRESTO) 49-51 MG, Take 1 tablet by mouth 2 (two) times daily., Disp: 180 tablet, Rfl: 3   spironolactone (ALDACTONE) 25 MG tablet, Take 1 tablet by mouth once daily, Disp: 90 tablet, Rfl: 3   No Known Allergies  Past Medical History:  Diagnosis Date   CHF (congestive heart failure) (HCC)    Chronic systolic heart failure (HCC)    a. NICM b. ECHO (11/2012): EF 15-20%, Diff HK, grade IV DD, mod MR, LA mildly dilated c. RHC (02/2013): RA: 3, RV: 35/7, PA: 32/11 (19), PCWP: 6, PA sat 69%, Fick CO/CI: 4.6 / 2.8 d. ECHO (12/2013): EF 35-40%, diff HK, grade I DD, trivial MR, LA mildly dialted, RV nl   Hypertension, malignant    NICM (nonischemic cardiomyopathy) (HCC)    LHC (02/2013): Lmain: no sig. dz, LAD: luminal irregularities, LCx: luminal irregularities, co-dominant, RCA: luminal irregularities, rel small, co-dominant with LCx   Tobacco abuse      Past Surgical History:  Procedure Laterality Date   TUBAL LIGATION  2000    Family History  Problem Relation Age of Onset   Stroke Mother    Heart disease Mother    Heart disease Father  Social History   Tobacco Use   Smoking status: Every Day    Current packs/day: 0.50    Types: Cigarettes   Smokeless tobacco: Never  Vaping Use   Vaping status: Never Used  Substance Use Topics   Alcohol use: Yes    Comment: very rarely   Drug use: No    ROS   Objective:   Vitals: BP 119/75 (BP Location: Right Arm)   Pulse 97   Temp 98.2 F (36.8 C) (Oral)   Resp 16   LMP 11/16/2012   SpO2 99%   Physical Exam Constitutional:      General: She is not in acute distress.    Appearance: Normal appearance. She is  well-developed. She is not ill-appearing, toxic-appearing or diaphoretic.  HENT:     Head: Normocephalic and atraumatic.     Nose: Nose normal.     Mouth/Throat:     Mouth: Mucous membranes are moist.     Pharynx: Oropharynx is clear.  Eyes:     General: No scleral icterus.       Right eye: No discharge.        Left eye: No discharge.     Extraocular Movements: Extraocular movements intact.     Conjunctiva/sclera: Conjunctivae normal.  Cardiovascular:     Rate and Rhythm: Normal rate and regular rhythm.     Heart sounds: Normal heart sounds. No murmur heard.    No friction rub. No gallop.  Pulmonary:     Effort: Pulmonary effort is normal. No respiratory distress.     Breath sounds: No stridor. No wheezing, rhonchi or rales.  Chest:     Chest wall: No tenderness.  Abdominal:     General: Bowel sounds are normal. There is no distension.     Palpations: Abdomen is soft. There is no mass.     Tenderness: There is no abdominal tenderness. There is no right CVA tenderness, left CVA tenderness, guarding or rebound.  Skin:    General: Skin is warm and dry.  Neurological:     General: No focal deficit present.     Mental Status: She is alert and oriented to person, place, and time.  Psychiatric:        Mood and Affect: Mood normal.        Behavior: Behavior normal.        Thought Content: Thought content normal.        Judgment: Judgment normal.     Assessment and Plan :   PDMP not reviewed this encounter.  1. Abdominal bloating    X-ray over-read was pending at time of discharge, recommended follow up with only abnormal results. Otherwise will not call for negative over-read. Patient was in agreement.  Last month, patient had blood work done of which she was found to have borderline anemia, borderline elevated ALT levels.  In clinic today, she is hemodynamically stable and does not have signs of an acute abdomen on exam.  As such, will defer ER visit and patient is in agreement.   Counseled patient on potential for adverse effects with medications prescribed/recommended today, ER and return-to-clinic precautions discussed, patient verbalized understanding.    Wallis Bamberg, [place] 10/26/23 1432

## 2023-10-26 NOTE — ED Triage Notes (Signed)
 Pt reports her abdomen feels bloated, full after eating and drinking x 5 days. Pt feels like if she can burp she will feel better. Pt has not taken any meds for complaints. Pt denies any pain.

## 2023-10-30 ENCOUNTER — Emergency Department (HOSPITAL_COMMUNITY)

## 2023-10-30 ENCOUNTER — Other Ambulatory Visit: Payer: Self-pay

## 2023-10-30 ENCOUNTER — Encounter (HOSPITAL_COMMUNITY): Payer: Self-pay | Admitting: *Deleted

## 2023-10-30 ENCOUNTER — Inpatient Hospital Stay (HOSPITAL_COMMUNITY)
Admission: EM | Admit: 2023-10-30 | Discharge: 2023-11-16 | DRG: 843 | Disposition: E | Attending: Internal Medicine | Admitting: Internal Medicine

## 2023-10-30 DIAGNOSIS — K6289 Other specified diseases of anus and rectum: Secondary | ICD-10-CM

## 2023-10-30 DIAGNOSIS — K7290 Hepatic failure, unspecified without coma: Secondary | ICD-10-CM | POA: Diagnosis not present

## 2023-10-30 DIAGNOSIS — D124 Benign neoplasm of descending colon: Secondary | ICD-10-CM | POA: Diagnosis present

## 2023-10-30 DIAGNOSIS — I110 Hypertensive heart disease with heart failure: Secondary | ICD-10-CM | POA: Diagnosis present

## 2023-10-30 DIAGNOSIS — K6389 Other specified diseases of intestine: Secondary | ICD-10-CM | POA: Diagnosis present

## 2023-10-30 DIAGNOSIS — I959 Hypotension, unspecified: Secondary | ICD-10-CM | POA: Diagnosis not present

## 2023-10-30 DIAGNOSIS — I5022 Chronic systolic (congestive) heart failure: Secondary | ICD-10-CM | POA: Diagnosis present

## 2023-10-30 DIAGNOSIS — T500X6A Underdosing of mineralocorticoids and their antagonists, initial encounter: Secondary | ICD-10-CM | POA: Diagnosis present

## 2023-10-30 DIAGNOSIS — D75839 Thrombocytosis, unspecified: Secondary | ICD-10-CM | POA: Diagnosis present

## 2023-10-30 DIAGNOSIS — Z91148 Patient's other noncompliance with medication regimen for other reason: Secondary | ICD-10-CM

## 2023-10-30 DIAGNOSIS — K5900 Constipation, unspecified: Secondary | ICD-10-CM | POA: Diagnosis present

## 2023-10-30 DIAGNOSIS — C775 Secondary and unspecified malignant neoplasm of intrapelvic lymph nodes: Secondary | ICD-10-CM | POA: Diagnosis present

## 2023-10-30 DIAGNOSIS — Z79899 Other long term (current) drug therapy: Secondary | ICD-10-CM

## 2023-10-30 DIAGNOSIS — C787 Secondary malignant neoplasm of liver and intrahepatic bile duct: Secondary | ICD-10-CM | POA: Diagnosis present

## 2023-10-30 DIAGNOSIS — Z6827 Body mass index (BMI) 27.0-27.9, adult: Secondary | ICD-10-CM

## 2023-10-30 DIAGNOSIS — E162 Hypoglycemia, unspecified: Secondary | ICD-10-CM | POA: Diagnosis not present

## 2023-10-30 DIAGNOSIS — T508X5A Adverse effect of diagnostic agents, initial encounter: Secondary | ICD-10-CM | POA: Diagnosis not present

## 2023-10-30 DIAGNOSIS — E871 Hypo-osmolality and hyponatremia: Secondary | ICD-10-CM | POA: Diagnosis not present

## 2023-10-30 DIAGNOSIS — Z823 Family history of stroke: Secondary | ICD-10-CM

## 2023-10-30 DIAGNOSIS — C7801 Secondary malignant neoplasm of right lung: Secondary | ICD-10-CM | POA: Diagnosis present

## 2023-10-30 DIAGNOSIS — C7A8 Other malignant neuroendocrine tumors: Secondary | ICD-10-CM | POA: Diagnosis not present

## 2023-10-30 DIAGNOSIS — Z8249 Family history of ischemic heart disease and other diseases of the circulatory system: Secondary | ICD-10-CM

## 2023-10-30 DIAGNOSIS — Z515 Encounter for palliative care: Secondary | ICD-10-CM

## 2023-10-30 DIAGNOSIS — E875 Hyperkalemia: Secondary | ICD-10-CM | POA: Diagnosis not present

## 2023-10-30 DIAGNOSIS — C20 Malignant neoplasm of rectum: Principal | ICD-10-CM | POA: Diagnosis present

## 2023-10-30 DIAGNOSIS — Z9851 Tubal ligation status: Secondary | ICD-10-CM

## 2023-10-30 DIAGNOSIS — N179 Acute kidney failure, unspecified: Secondary | ICD-10-CM | POA: Diagnosis not present

## 2023-10-30 DIAGNOSIS — K7460 Unspecified cirrhosis of liver: Secondary | ICD-10-CM | POA: Diagnosis present

## 2023-10-30 DIAGNOSIS — F17210 Nicotine dependence, cigarettes, uncomplicated: Secondary | ICD-10-CM | POA: Diagnosis present

## 2023-10-30 DIAGNOSIS — E43 Unspecified severe protein-calorie malnutrition: Secondary | ICD-10-CM | POA: Diagnosis present

## 2023-10-30 DIAGNOSIS — K635 Polyp of colon: Secondary | ICD-10-CM | POA: Diagnosis present

## 2023-10-30 DIAGNOSIS — I428 Other cardiomyopathies: Secondary | ICD-10-CM | POA: Diagnosis present

## 2023-10-30 DIAGNOSIS — C7802 Secondary malignant neoplasm of left lung: Secondary | ICD-10-CM | POA: Diagnosis present

## 2023-10-30 DIAGNOSIS — R6881 Early satiety: Secondary | ICD-10-CM | POA: Diagnosis present

## 2023-10-30 DIAGNOSIS — D122 Benign neoplasm of ascending colon: Secondary | ICD-10-CM | POA: Diagnosis present

## 2023-10-30 DIAGNOSIS — R748 Abnormal levels of other serum enzymes: Secondary | ICD-10-CM

## 2023-10-30 DIAGNOSIS — R531 Weakness: Secondary | ICD-10-CM | POA: Diagnosis present

## 2023-10-30 DIAGNOSIS — N170 Acute kidney failure with tubular necrosis: Secondary | ICD-10-CM | POA: Insufficient documentation

## 2023-10-30 DIAGNOSIS — Z90710 Acquired absence of both cervix and uterus: Secondary | ICD-10-CM

## 2023-10-30 DIAGNOSIS — T463X6A Underdosing of coronary vasodilators, initial encounter: Secondary | ICD-10-CM | POA: Diagnosis present

## 2023-10-30 DIAGNOSIS — D123 Benign neoplasm of transverse colon: Secondary | ICD-10-CM | POA: Diagnosis present

## 2023-10-30 DIAGNOSIS — E8721 Acute metabolic acidosis: Secondary | ICD-10-CM | POA: Diagnosis present

## 2023-10-30 DIAGNOSIS — D630 Anemia in neoplastic disease: Secondary | ICD-10-CM | POA: Diagnosis present

## 2023-10-30 DIAGNOSIS — K7689 Other specified diseases of liver: Secondary | ICD-10-CM

## 2023-10-30 DIAGNOSIS — R140 Abdominal distension (gaseous): Secondary | ICD-10-CM | POA: Diagnosis not present

## 2023-10-30 DIAGNOSIS — Z66 Do not resuscitate: Secondary | ICD-10-CM | POA: Diagnosis not present

## 2023-10-30 DIAGNOSIS — T447X6A Underdosing of beta-adrenoreceptor antagonists, initial encounter: Secondary | ICD-10-CM | POA: Diagnosis present

## 2023-10-30 DIAGNOSIS — N1411 Contrast-induced nephropathy: Secondary | ICD-10-CM | POA: Diagnosis not present

## 2023-10-30 DIAGNOSIS — F17200 Nicotine dependence, unspecified, uncomplicated: Secondary | ICD-10-CM | POA: Diagnosis present

## 2023-10-30 DIAGNOSIS — C189 Malignant neoplasm of colon, unspecified: Principal | ICD-10-CM

## 2023-10-30 DIAGNOSIS — D72829 Elevated white blood cell count, unspecified: Secondary | ICD-10-CM | POA: Diagnosis present

## 2023-10-30 LAB — COMPREHENSIVE METABOLIC PANEL
ALT: 232 U/L — ABNORMAL HIGH (ref 0–44)
AST: 355 U/L — ABNORMAL HIGH (ref 15–41)
Albumin: 2.7 g/dL — ABNORMAL LOW (ref 3.5–5.0)
Alkaline Phosphatase: 663 U/L — ABNORMAL HIGH (ref 38–126)
Anion gap: 15 (ref 5–15)
BUN: 42 mg/dL — ABNORMAL HIGH (ref 6–20)
CO2: 20 mmol/L — ABNORMAL LOW (ref 22–32)
Calcium: 8.7 mg/dL — ABNORMAL LOW (ref 8.9–10.3)
Chloride: 100 mmol/L (ref 98–111)
Creatinine, Ser: 1.41 mg/dL — ABNORMAL HIGH (ref 0.44–1.00)
GFR, Estimated: 43 mL/min — ABNORMAL LOW (ref >60)
Glucose, Bld: 131 mg/dL — ABNORMAL HIGH (ref 70–99)
Potassium: 4.1 mmol/L (ref 3.5–5.1)
Sodium: 135 mmol/L (ref 135–145)
Total Bilirubin: 10.9 mg/dL — ABNORMAL HIGH (ref 0.0–1.2)
Total Protein: 6.6 g/dL (ref 6.5–8.1)

## 2023-10-30 LAB — CBC WITH DIFFERENTIAL/PLATELET
Abs Immature Granulocytes: 0.08 10*3/uL — ABNORMAL HIGH (ref 0.00–0.07)
Basophils Absolute: 0 10*3/uL (ref 0.0–0.1)
Basophils Relative: 0 %
Eosinophils Absolute: 0.1 10*3/uL (ref 0.0–0.5)
Eosinophils Relative: 0 %
HCT: 30.8 % — ABNORMAL LOW (ref 36.0–46.0)
Hemoglobin: 10.6 g/dL — ABNORMAL LOW (ref 12.0–15.0)
Immature Granulocytes: 1 %
Lymphocytes Relative: 12 %
Lymphs Abs: 1.7 10*3/uL (ref 0.7–4.0)
MCH: 30.4 pg (ref 26.0–34.0)
MCHC: 34.4 g/dL (ref 30.0–36.0)
MCV: 88.3 fL (ref 80.0–100.0)
Monocytes Absolute: 1.5 10*3/uL — ABNORMAL HIGH (ref 0.1–1.0)
Monocytes Relative: 10 %
Neutro Abs: 11.8 10*3/uL — ABNORMAL HIGH (ref 1.7–7.7)
Neutrophils Relative %: 77 %
Platelets: 536 10*3/uL — ABNORMAL HIGH (ref 150–400)
RBC: 3.49 MIL/uL — ABNORMAL LOW (ref 3.87–5.11)
RDW: 18.2 % — ABNORMAL HIGH (ref 11.5–15.5)
WBC: 15.2 10*3/uL — ABNORMAL HIGH (ref 4.0–10.5)
nRBC: 0 % (ref 0.0–0.2)

## 2023-10-30 LAB — BILIRUBIN, DIRECT: Bilirubin, Direct: 6.7 mg/dL — ABNORMAL HIGH (ref 0.0–0.2)

## 2023-10-30 LAB — LIPASE, BLOOD: Lipase: 109 U/L — ABNORMAL HIGH (ref 11–51)

## 2023-10-30 LAB — HEPATITIS PANEL, ACUTE
HCV Ab: NONREACTIVE
Hep A IgM: NONREACTIVE
Hep B C IgM: NONREACTIVE
Hepatitis B Surface Ag: NONREACTIVE

## 2023-10-30 LAB — BRAIN NATRIURETIC PEPTIDE: B Natriuretic Peptide: 26 pg/mL (ref 0.0–100.0)

## 2023-10-30 MED ORDER — ONDANSETRON HCL 4 MG/2ML IJ SOLN
4.0000 mg | Freq: Once | INTRAMUSCULAR | Status: AC
  Administered 2023-10-30: 4 mg via INTRAVENOUS
  Filled 2023-10-30: qty 2

## 2023-10-30 MED ORDER — ALBUTEROL SULFATE (2.5 MG/3ML) 0.083% IN NEBU: 2.5000 mg | INHALATION_SOLUTION | Freq: Four times a day (QID) | RESPIRATORY_TRACT | Status: DC | PRN

## 2023-10-30 MED ORDER — POLYETHYLENE GLYCOL 3350 17 G PO PACK: 17.0000 g | PACK | Freq: Every day | ORAL | Status: DC | PRN

## 2023-10-30 MED ORDER — IOHEXOL 350 MG/ML SOLN
75.0000 mL | Freq: Once | INTRAVENOUS | Status: AC | PRN
  Administered 2023-10-30: 75 mL via INTRAVENOUS

## 2023-10-30 MED ORDER — FENTANYL CITRATE PF 50 MCG/ML IJ SOSY
50.0000 ug | PREFILLED_SYRINGE | Freq: Once | INTRAMUSCULAR | Status: AC
  Administered 2023-10-30: 50 ug via INTRAVENOUS
  Filled 2023-10-30: qty 1

## 2023-10-30 MED ORDER — ENOXAPARIN SODIUM 40 MG/0.4ML IJ SOSY: 40.0000 mg | PREFILLED_SYRINGE | INTRAMUSCULAR | Status: DC

## 2023-10-30 MED ORDER — BISACODYL 5 MG PO TBEC: 5.0000 mg | DELAYED_RELEASE_TABLET | Freq: Every day | ORAL | Status: DC | PRN

## 2023-10-30 MED ORDER — HYDROMORPHONE HCL 1 MG/ML IJ SOLN
0.5000 mg | INTRAMUSCULAR | Status: DC | PRN
  Administered 2023-11-01 – 2023-11-05 (×4): 0.5 mg via INTRAVENOUS
  Filled 2023-10-30 (×4): qty 0.5

## 2023-10-30 MED ORDER — HYDRALAZINE HCL 20 MG/ML IJ SOLN: 10.0000 mg | Freq: Four times a day (QID) | INTRAMUSCULAR | Status: DC | PRN

## 2023-10-30 MED ORDER — ACETAMINOPHEN 650 MG RE SUPP: 650.0000 mg | Freq: Four times a day (QID) | RECTAL | Status: DC | PRN

## 2023-10-30 MED ORDER — ACETAMINOPHEN 325 MG PO TABS: 650.0000 mg | ORAL_TABLET | Freq: Four times a day (QID) | ORAL | Status: DC | PRN

## 2023-10-30 NOTE — ED Triage Notes (Signed)
 C/o abd pain and fullness after eating onset Monday went to [facility] on Tues had xray and was told they didn't see anything onset sob on thurs. C/o nausea no vomiting. No diarrhea

## 2023-10-30 NOTE — Plan of Care (Signed)

## 2023-10-30 NOTE — ED Provider Notes (Signed)
 [place] EMERGENCY DEPARTMENT AT [facility] Provider Note   CSN: [id number] Arrival date & time: 10/30/23  1011     History  Chief Complaint  Patient presents with   Shortness of Breath    Kim Underwood is a 58 y.o. female with past medical history of hypertension, congestive heart failure presenting to emergency room with complaint of abdominal fullness and bloating. Worse after eating. Patient reports this started approximately 5 days ago.  She has had some associated nausea.  Reports that yesterday when she was up walking around she felt short of breath.  She denies any specific abdominal pain however reports some epigastric discomfort.  Denies any vomiting or change in stool.  Denies chest pain, cough, chills or fever.  She has no history of excessive acetaminophen use or alcohol abuse.  No history of drug use.  She has no prior abdominal surgeries.  She reports she was recently started on some thyroid medication but she never took it.   Shortness of Breath      Home Medications Prior to Admission medications   Medication Sig Start Date End Date Taking? Authorizing Provider  acetaminophen (TYLENOL) 325 MG tablet Take 650 mg by mouth every 4 (four) hours as needed for moderate pain.    [provider]  buPROPion (WELLBUTRIN SR) 150 MG 12 hr tablet Take 1 tablet (150 mg total) by mouth 2 (two) times daily. Patient not taking: Reported on 09/29/2023 01/06/22   Laurey Morale, MD  carvedilol (COREG) 6.25 MG tablet TAKE 1 TABLET BY MOUTH TWICE DAILY WITH MEALS . APPOINTMENT REQUIRED FOR FUTURE REFILLS 09/01/23   Laurey Morale, MD  cetirizine (ZYRTEC ALLERGY) 10 MG tablet Take 1 tablet (10 mg total) by mouth daily. 08/07/22   Wallis Bamberg, PA-C  hydrALAZINE (APRESOLINE) 50 MG tablet Take 1 tablet (50 mg total) by mouth 2 (two) times daily. NEEDS FOLLOW UP APPOINTMENT FOR MORE REFILLS 05/20/23   Laurey Morale, MD  isosorbide mononitrate (IMDUR) 30 MG 24 hr  tablet Take 1 tablet (30 mg total) by mouth daily. NEEDS FOLLOW UP APPOINTMENT FOR MORE REFILLS 05/20/23   Laurey Morale, MD  predniSONE (DELTASONE) 10 MG tablet Take 3 tablets (30 mg total) by mouth daily with breakfast. Patient not taking: Reported on 09/29/2023 08/07/22   Wallis Bamberg, PA-C  promethazine-dextromethorphan (PROMETHAZINE-DM) 6.25-15 MG/5ML syrup Take 2.5 mLs by mouth 3 (three) times daily as needed for cough. Patient not taking: Reported on 09/29/2023 08/07/22   Wallis Bamberg, PA-C  propylthiouracil (PTU) 50 MG tablet Take 1 tablet (50 mg total) by mouth daily in the afternoon. 10/19/23   Shamleffer, Konrad Dolores, MD  sacubitril-valsartan (ENTRESTO) 49-51 MG Take 1 tablet by mouth 2 (two) times daily. 10/05/22   Laurey Morale, MD  spironolactone (ALDACTONE) 25 MG tablet Take 1 tablet by mouth once daily 02/25/23   Laurey Morale, MD      Allergies    Patient has no known allergies.    Review of Systems   Review of Systems  Respiratory:  Positive for shortness of breath.     Physical Exam Updated Vital Signs BP 97/67 (BP Location: Right Arm)   Pulse 98   Temp 97.8 F (36.6 C)   Resp 19   Ht 5' (1.524 m)   Wt 65.8 kg   LMP 11/16/2012   SpO2 100%   BMI 28.32 kg/m  Physical Exam Vitals and nursing note reviewed.  Constitutional:  General: She is not in acute distress.    Appearance: She is not toxic-appearing.  HENT:     Head: Normocephalic and atraumatic.  Eyes:     General: No scleral icterus.    Conjunctiva/sclera: Conjunctivae normal.  Cardiovascular:     Rate and Rhythm: Normal rate and regular rhythm.     Pulses: Normal pulses.     Heart sounds: Normal heart sounds.  Pulmonary:     Effort: Pulmonary effort is normal. No respiratory distress.     Breath sounds: Normal breath sounds.  Abdominal:     General: Abdomen is flat. Bowel sounds are normal.     Palpations: Abdomen is soft. There is mass.     Tenderness: There is no abdominal  tenderness.     Comments: Palpable enlarged liver, no significant TTP  Musculoskeletal:     Right lower leg: No edema.     Left lower leg: No edema.  Skin:    General: Skin is warm and dry.     Findings: No lesion.  Neurological:     General: No focal deficit present.     Mental Status: She is alert and oriented to person, place, and time. Mental status is at baseline.     ED Results / Procedures / Treatments   Labs (all labs ordered are listed, but only abnormal results are displayed) Labs Reviewed  COMPREHENSIVE METABOLIC PANEL - Abnormal; Notable for the following components:      Result Value   CO2 20 (*)    Glucose, Bld 131 (*)    BUN 42 (*)    Creatinine, Ser 1.41 (*)    Calcium 8.7 (*)    Albumin 2.7 (*)    AST 355 (*)    ALT 232 (*)    Alkaline Phosphatase 663 (*)    Total Bilirubin 10.9 (*)    GFR, Estimated 43 (*)    All other components within normal limits  LIPASE, BLOOD - Abnormal; Notable for the following components:   Lipase 109 (*)    All other components within normal limits  CBC WITH DIFFERENTIAL/PLATELET - Abnormal; Notable for the following components:   WBC 15.2 (*)    RBC 3.49 (*)    Hemoglobin 10.6 (*)    HCT 30.8 (*)    RDW 18.2 (*)    Platelets 536 (*)    Neutro Abs 11.8 (*)    Monocytes Absolute 1.5 (*)    Abs Immature Granulocytes 0.08 (*)    All other components within normal limits  URINALYSIS, ROUTINE W REFLEX MICROSCOPIC  HEPATITIS PANEL, ACUTE  BRAIN NATRIURETIC PEPTIDE    EKG None  Radiology CT ABDOMEN PELVIS W CONTRAST Result Date: 10/30/2023 CLINICAL DATA:  Abdominal pain and bloating.  Abnormal ultrasound EXAM: CT ABDOMEN AND PELVIS WITH CONTRAST TECHNIQUE: Multidetector CT imaging of the abdomen and pelvis was performed using the standard protocol following bolus administration of intravenous contrast. RADIATION DOSE REDUCTION: This exam was performed according to the departmental dose-optimization program which includes  automated exposure control, adjustment of the mA and/or kV according to patient size and/or use of iterative reconstruction technique. CONTRAST:  75mL OMNIPAQUE IOHEXOL 350 MG/ML SOLN COMPARISON:  Ultrasound same day FINDINGS: Lower chest: Bilateral round lower lobe pulmonary nodules. Nodules of varying size. Nodule in RIGHT lower lobe medially along the pleural surface measures 14 mm (image 12). Nodule LEFT lower lobe measures 5 mm on image 7. Proximally 3-6 nodules in each lung base. Hepatobiliary: Innumerable hypoenhancing lesions within  LEFT and RIGHT hepatic lobe. The liver is expanded by this near confluent pattern. Largest lesion in the RIGHT hepatic lobe measures 6.5 cm (image 14/3). Small example lesion in the LEFT hepatic lobe measures 2 cm image 18/3. These lesions are too numerous to count range in size from 10 mm to 65 mm. Gallbladder collapsed Pancreas: Pancreas is normal. No ductal dilatation. No pancreatic inflammation. Spleen: Normal spleen Adrenals/urinary tract: Adrenal glands and kidneys are normal. The ureters and bladder normal. Stomach/Bowel: Stomach, duodenum small-bowel normal. Appendix not identified. Ascending and transverse colon normal. Descending colon normal. There is asymmetric thickening in the mid and distal rectum along the LEFT wall of the rectum measuring 3.2 cm (image 64/3). Bulky endoluminal rectal mass is well seen on coronal imaging measuring 4.8 cm (image 26/6). enlarged lymph nodes in the LEFT mesorectum measuring 10-12 mm on image 64/3. Exophytic extension from the rectum to the presacral space measuring 3.7 cm on image 60/3 Vascular/Lymphatic: No upper abdominal adenopathy. Axial adenopathy described above. Reproductive: Post hysterectomy anatomy. There is irregularity in the upper aspect of the vagina/vaginal cuff (image 63/3) Other: No free fluid. Musculoskeletal: No aggressive osseous lesion. IMPRESSION: 1. Bulky rectal mass with extension to the presacral space most  consistent with primary rectal adenocarcinoma. No evidence of bowel obstruction. 2. Local nodal metastasis to the mesorectum. 3. Potential local extension to upper vagina.  Post hysterectomy. 4. Innumerable HEPATIC METASTASIS. 5. Bilateral lower lobe pulmonary nodules consistent with PULMONARY METASTASIS. Findings conveyed toKommer, MD on 10/30/2023  at14:03. Electronically Signed   By: Genevive Bi M.D.   On: 10/30/2023 14:03   US Abdomen Limited RUQ (LIVER/GB) Result Date: 10/30/2023 CLINICAL DATA:  [id number] Transaminitis [id number] EXAM: ULTRASOUND ABDOMEN LIMITED RIGHT UPPER QUADRANT COMPARISON:  None Available. FINDINGS: Gallbladder: Partially contracted. No gallstones or wall thickening visualized. No sonographic Murphy sign noted by sonographer. Common bile duct: Diameter: 6 mm Liver: Diffusely heterogeneous appearance of the liver with coarsened echotexture and generally increased parenchymal echogenicity. Possible solid mass in the caudate lobe measuring 4.5 cm. Nodular hepatic surface contour. Portal vein is patent on color Doppler imaging with normal direction of blood flow towards the liver. Other: None. IMPRESSION: 1. Cirrhotic morphology of the liver with possible solid mass in the caudate lobe measuring 4.5 cm. Further evaluation with contrast-enhanced MRI of the abdomen is recommended. 2. No evidence of cholelithiasis or acute cholecystitis. Electronically Signed   By: Duanne Guess D.O.   On: 10/30/2023 13:16   DG Chest 2 View Result Date: 10/30/2023 CLINICAL DATA:  Shortness of breath EXAM: CHEST - 2 VIEW COMPARISON:  09/03/2015 FINDINGS: The heart size and mediastinal contours are within normal limits. No focal airspace consolidation, pleural effusion, or pneumothorax. The visualized skeletal structures are unremarkable. IMPRESSION: No active cardiopulmonary disease. Electronically Signed   By: Duanne Guess D.O.   On: 10/30/2023 12:00    Procedures Procedures    Medications  Ordered in ED Medications - No data to display  ED Course/ Medical Decision Making/ A&P Clinical Course as of 10/30/23 1747  Sat Oct 30, 2023  1559 Dr Posey Rea has communicated results to patient and family at bedside. They understand diagnosis and plan.  [JB]  1600 Accepted for admission by hospital team.  [JB]    Clinical Course User Index [JB] Terryn Rosenkranz, Horald Chestnut, PA-C  Medical Decision Making Amount and/or Complexity of Data Reviewed Labs: ordered. Radiology: ordered.  Risk Prescription drug management. Decision regarding hospitalization.   Kim Underwood 58 y.o. presented today for abd pain. Working DDx includes, but not limited to, gastroenteritis, colitis, SBO, appendicitis, cholecystitis, hepatobiliary pathology, gastritis, PUD, ACS, dissection, pancreatitis, nephrolithiasis, AAA, UTI, pyelonephritis, hepatitis.   Review of prior external notes: UC visit for similar   Pmhx: HTN, CHF  Unique Tests and My Interpretation:  CBC leukocytosis 15, hemoglobin is 10.6, platelets 536 CMP potassium within normal limits, BUN 42, creatinine is 1.41 both elevated from baseline, AST, ALT and alk phos elevated, total bilirubin 10.9 Lipase 109 Hepatitis panel pending   Imaging:  Chest x-ray to evaluate shortness of breath which was negative for acute pathology Right upper quadrant ultrasound secondary to elevated liver enzymes  Abdominal CT scan to evaluate generalized abdominal tenderness and bloating   Problem List / ED Course / Critical interventions / Medication management  Patient reporting to emergency room with 5 days of abdominal distention and discomfort.  Patient reports discomfort in epigastric area.  She is hemodynamically stable.  On exam she does have mildly distended abdomen and mild tenderness to palpation in epigastric area however her pain is hard to locate.  She has no jaundice or scleral icterus.  Her lungs are clear to  auscultation bilaterally and she is in no acute distress.  Lab work shows significantly elevated liver enzymes and total bilirubin.  This is not consistent with prior labs.  Will obtain hepatitis panel, ultrasound and CT scan to further evaluate.  She does not have history of alcohol or drug use.  No known medication side effects that would cause this.  She also has some shortness of breath but does not have any cough or chills.  Will obtain chest x-ray.  Feel this is likely secondary to her abdominal distention making it difficult for her to take deep breath in. No findings of enlarged common bile duct no lower cholecystitis on right upper quadrant ultrasound.  They do see possible mass.  I have already ordered abdominal scan to further evaluate patient's symptoms. CT scan shows concern for colon cancer with profound metastasis.  Attending discussed labs and imaging with patient, they expressed understanding and agrees to admission for further care.  I ordered medication including Zofran, fentanyl Reevaluation of the patient after these medicines showed that the patient improved Patients vitals assessed. Upon arrival patient is hemodynamically stable.  I have reviewed the patients home medicines and have made adjustments as needed   Consult: Hospital team for admission, they agree  Plan: Admit for further eval of cancer and metastasis           Final Clinical Impression(s) / ED Diagnoses Final diagnoses:  None    Rx / DC Orders ED Discharge Orders     None         Reinaldo Raddle 10/30/23 1751    Glendora Score, MD 10/31/23 2105

## 2023-10-30 NOTE — H&P (Signed)
 [facility] History and Physical  Kim Underwood YNW:[id number] DOB: 1965/11/18 DOA: 10/30/2023 PCP: Patient, No Pcp Per  Presented from: Home Chief Complaint: Abdominal fullness, bloating  History of Present Illness: Kim Underwood is a 58 y.o. female with PMH significant for hypertension, chronic systolic CHF, NICM with improved EF and no GI issues in the past. Patient presented to the ED today with complaint of abdominal fullness, bloating. Symptoms started 7-10 days ago with sense of abdominal fullness, bloating, nausea, some epigastric discomfort and poor oral intake.  Denies any vomiting,  bleeding or black stool.  Reports that her stool caliber has been small lately. Never had a colonoscopy or prior GI issues.  In the ED, patient was afebrile, hemodynamically stable. Labs with WC count 15.2, hemoglobin 10.6, BUN/creatinine 42/1.41, AST, ALT, alk phos, bilirubin elevated to 355, 232, 663, 10.9 Acute hepatitis panel nonreactive Ultrasound liver showed cirrhotic morphology of liver with possible solid mass of 4.5 cm in the caudate lobe. CT abdomen pelvis with contrast was obtained which showed -Bulky rectal mass with extension to the presacral space most consistent with primary rectal adenocarcinoma. No evidence of bowel obstruction. -Local nodal metastasis to the mesorectum. -Potential local extension to upper vagina.  Post hysterectomy. -Innumerable HEPATIC METASTASIS. -Bilateral lower lobe pulmonary nodules consistent with PULMONARY METASTASIS.   Findings were discussed with patient. Hospitalist service was consulted for further workup and inpatient management.  At the time of my evaluation, patient was propped up in bed.  Seemingly frustrated and overwhelmed.  Boyfriend at bedside. History reviewed and detailed as above. Consulted GI.   Review of Systems:  All systems were reviewed and were negative unless otherwise mentioned in the HPI   Past medical  history: Past Medical History:  Diagnosis Date   CHF (congestive heart failure) (HCC)    Chronic systolic heart failure (HCC)    a. NICM b. ECHO (11/2012): EF 15-20%, Diff HK, grade IV DD, mod MR, LA mildly dilated c. RHC (02/2013): RA: 3, RV: 35/7, PA: 32/11 (19), PCWP: 6, PA sat 69%, Fick CO/CI: 4.6 / 2.8 d. ECHO (12/2013): EF 35-40%, diff HK, grade I DD, trivial MR, LA mildly dialted, RV nl   Hypertension, malignant    NICM (nonischemic cardiomyopathy) (HCC)    LHC (02/2013): Lmain: no sig. dz, LAD: luminal irregularities, LCx: luminal irregularities, co-dominant, RCA: luminal irregularities, rel small, co-dominant with LCx   Tobacco abuse     Past surgical history: Past Surgical History:  Procedure Laterality Date   TUBAL LIGATION  2000    Social History:  reports that she has been smoking cigarettes. She has never used smokeless tobacco. She reports that she does not currently use alcohol. She reports that she does not use drugs.  Allergies:  No Known Allergies Patient has no known allergies.   Family history:  Family History  Problem Relation Age of Onset   Stroke Mother    Heart disease Mother    Heart disease Father      Physical Exam: Vitals:   10/30/23 1017 10/30/23 1031 10/30/23 1424 10/30/23 1559  BP: 97/67  106/66   Pulse: 98  85   Resp: 19  17   Temp: 97.8 F (36.6 C)  98.2 F (36.8 C) 98.1 F (36.7 C)  TempSrc:   Oral Oral  SpO2: 100%  95%   Weight:  65.8 kg    Height:  5' (1.524 m)     Wt Readings from Last 3 Encounters:  10/30/23 65.8 kg  09/29/23 70.1 kg  01/06/22 69.7 kg   Body mass index is 28.32 kg/m.  General exam: Pleasant, middle-aged African-American female.  Not in pain Skin: No rashes, lesions or ulcers. HEENT: Atraumatic, normocephalic, no obvious bleeding Lungs: Clear to auscultation bilaterally,  CVS: S1, S2, no murmur,   GI/Abd: Soft, nontender, mild distention noted, bowel sound present,   CNS: Alert, awake, oriented x  3 Psychiatry: Sad affect Extremities: No pedal edema, no calf tenderness,    ----------------------------------------------------------------------------------------------------------------------------------------- ----------------------------------------------------------------------------------------------------------------------------------------- -----------------------------------------------------------------------------------------------------------------------------------------  Assessment/Plan: Principal Problem:   Rectal mass  Suspect metastatic rectal cancer Presented with 5 days of abdominal fullness, bloating, small caliber stool Imagings suggestive of rectal cancer with local extension, liver mets and hepatic mets I have consulted GI to help with tissue sampling as well assessment of proximal colon  Clear liquid diet for now.  N.p.o. after midnight  Elevated liver enzymes Likely secondary to secondary to metastasis. Also mention of liver cirrhosis and ultrasound Acute hepatitis panel nonreactive Total bilirubin elevated to 10.9.  Obtain direct bilirubin.  Obtain INR Recent Labs  Lab 10/30/23 1041  AST 355*  ALT 232*  ALKPHOS 663*  BILITOT 10.9*  PROT 6.6  ALBUMIN 2.7*  LIPASE 109*  PLT 536*   Leukocytosis Likely reactive.  No evidence of infection.  H/o systolic CHF, NICM Currently CHF remains compensated.   Last echo from 2022 with EF recovered to 50-55% PTA meds- Coreg, Imdur, Entresto, Aldactone Resume meds.  In anticipation of potential surgical resection and cancer treatment, repeat echocardiogram.  Last echo was 2 years ago  Mobility: Encourage ambulation  Goals of care:   Code Status: Full Code    DVT prophylaxis:  enoxaparin (LOVENOX) injection 40 mg Start: 10/31/23 2000   Antimicrobials: None Fluid: None Consultants: GI Family Communication: Boyfriend at bedside  Status: Observation Level of care:  Telemetry Cardiac   Patient is  from: Home Anticipated d/c to: Pending clinical course  Diet: Diet Order             Diet NPO time specified  Diet effective midnight           Diet clear liquid Room service appropriate? Yes; Fluid consistency: Thin  Diet effective now                    ------------------------------------------------------------------------------------- Severity of Illness: The appropriate patient status for this patient is OBSERVATION. Observation status is judged to be reasonable and necessary in order to provide the required intensity of service to ensure the patient's safety. The patient's presenting symptoms, physical exam findings, and initial radiographic and laboratory data in the context of their medical condition is felt to place them at decreased risk for further clinical deterioration. Furthermore, it is anticipated that the patient will be medically stable for discharge from the hospital within 2 midnights of admission.  -------------------------------------------------------------------------------------   Home Meds: Prior to Admission medications   Medication Sig Start Date End Date Taking? Authorizing Provider  acetaminophen (TYLENOL) 325 MG tablet Take 650 mg by mouth every 4 (four) hours as needed for moderate pain.    [provider]  buPROPion (WELLBUTRIN SR) 150 MG 12 hr tablet Take 1 tablet (150 mg total) by mouth 2 (two) times daily. Patient not taking: Reported on 09/29/2023 01/06/22   Laurey Morale, MD  carvedilol (COREG) 6.25 MG tablet TAKE 1 TABLET BY MOUTH TWICE DAILY WITH MEALS . APPOINTMENT REQUIRED FOR FUTURE REFILLS 09/01/23   Laurey Morale, MD  cetirizine [facility] ALLERGY) 10  MG tablet Take 1 tablet (10 mg total) by mouth daily. 08/07/22   Wallis Bamberg, PA-C  hydrALAZINE (APRESOLINE) 50 MG tablet Take 1 tablet (50 mg total) by mouth 2 (two) times daily. NEEDS FOLLOW UP APPOINTMENT FOR MORE REFILLS 05/20/23   Laurey Morale, MD  isosorbide mononitrate  (IMDUR) 30 MG 24 hr tablet Take 1 tablet (30 mg total) by mouth daily. NEEDS FOLLOW UP APPOINTMENT FOR MORE REFILLS 05/20/23   Laurey Morale, MD  predniSONE (DELTASONE) 10 MG tablet Take 3 tablets (30 mg total) by mouth daily with breakfast. Patient not taking: Reported on 09/29/2023 08/07/22   Wallis Bamberg, PA-C  promethazine-dextromethorphan (PROMETHAZINE-DM) 6.25-15 MG/5ML syrup Take 2.5 mLs by mouth 3 (three) times daily as needed for cough. Patient not taking: Reported on 09/29/2023 08/07/22   Wallis Bamberg, PA-C  propylthiouracil (PTU) 50 MG tablet Take 1 tablet (50 mg total) by mouth daily in the afternoon. 10/19/23   Shamleffer, Konrad Dolores, MD  sacubitril-valsartan (ENTRESTO) 49-51 MG Take 1 tablet by mouth 2 (two) times daily. 10/05/22   Laurey Morale, MD  spironolactone (ALDACTONE) 25 MG tablet Take 1 tablet by mouth once daily 02/25/23   Laurey Morale, MD    Labs on Admission:   CBC: Recent Labs  Lab 10/30/23 1041  WBC 15.2*  NEUTROABS 11.8*  HGB 10.6*  HCT 30.8*  MCV 88.3  PLT 536*    Basic Metabolic Panel: Recent Labs  Lab 10/30/23 1041  NA 135  K 4.1  CL 100  CO2 20*  GLUCOSE 131*  BUN 42*  CREATININE 1.41*  CALCIUM 8.7*    Liver Function Tests: Recent Labs  Lab 10/30/23 1041  AST 355*  ALT 232*  ALKPHOS 663*  BILITOT 10.9*  PROT 6.6  ALBUMIN 2.7*   Recent Labs  Lab 10/30/23 1041  LIPASE 109*   No results for input(s): "AMMONIA" in the last 168 hours.  Cardiac Enzymes: No results for input(s): "CKTOTAL", "CKMB", "CKMBINDEX", "TROPONINI" in the last 168 hours.  BNP (last 3 results) Recent Labs    10/30/23 1041  BNP 26.0    ProBNP (last 3 results) No results for input(s): "PROBNP" in the last 8760 hours.  CBG: No results for input(s): "GLUCAP" in the last 168 hours.  Lipase     Component Value Date/Time   LIPASE 109 (H) 10/30/2023 1041     Urinalysis No results found for: "COLORURINE", "APPEARANCEUR", "LABSPEC", "PHURINE",  "GLUCOSEU", "HGBUR", "BILIRUBINUR", "KETONESUR", "PROTEINUR", "UROBILINOGEN", "NITRITE", "LEUKOCYTESUR"   Drugs of Abuse  No results found for: "LABOPIA", "COCAINSCRNUR", "LABBENZ", "AMPHETMU", "THCU", "LABBARB"    Radiological Exams on Admission: CT ABDOMEN PELVIS W CONTRAST Result Date: 10/30/2023 CLINICAL DATA:  Abdominal pain and bloating.  Abnormal ultrasound EXAM: CT ABDOMEN AND PELVIS WITH CONTRAST TECHNIQUE: Multidetector CT imaging of the abdomen and pelvis was performed using the standard protocol following bolus administration of intravenous contrast. RADIATION DOSE REDUCTION: This exam was performed according to the departmental dose-optimization program which includes automated exposure control, adjustment of the mA and/or kV according to patient size and/or use of iterative reconstruction technique. CONTRAST:  75mL OMNIPAQUE IOHEXOL 350 MG/ML SOLN COMPARISON:  Ultrasound same day FINDINGS: Lower chest: Bilateral round lower lobe pulmonary nodules. Nodules of varying size. Nodule in RIGHT lower lobe medially along the pleural surface measures 14 mm (image 12). Nodule LEFT lower lobe measures 5 mm on image 7. Proximally 3-6 nodules in each lung base. Hepatobiliary: Innumerable hypoenhancing lesions within LEFT and RIGHT hepatic lobe. The  liver is expanded by this near confluent pattern. Largest lesion in the RIGHT hepatic lobe measures 6.5 cm (image 14/3). Small example lesion in the LEFT hepatic lobe measures 2 cm image 18/3. These lesions are too numerous to count range in size from 10 mm to 65 mm. Gallbladder collapsed Pancreas: Pancreas is normal. No ductal dilatation. No pancreatic inflammation. Spleen: Normal spleen Adrenals/urinary tract: Adrenal glands and kidneys are normal. The ureters and bladder normal. Stomach/Bowel: Stomach, duodenum small-bowel normal. Appendix not identified. Ascending and transverse colon normal. Descending colon normal. There is asymmetric thickening in the mid  and distal rectum along the LEFT wall of the rectum measuring 3.2 cm (image 64/3). Bulky endoluminal rectal mass is well seen on coronal imaging measuring 4.8 cm (image 26/6). enlarged lymph nodes in the LEFT mesorectum measuring 10-12 mm on image 64/3. Exophytic extension from the rectum to the presacral space measuring 3.7 cm on image 60/3 Vascular/Lymphatic: No upper abdominal adenopathy. Axial adenopathy described above. Reproductive: Post hysterectomy anatomy. There is irregularity in the upper aspect of the vagina/vaginal cuff (image 63/3) Other: No free fluid. Musculoskeletal: No aggressive osseous lesion. IMPRESSION: 1. Bulky rectal mass with extension to the presacral space most consistent with primary rectal adenocarcinoma. No evidence of bowel obstruction. 2. Local nodal metastasis to the mesorectum. 3. Potential local extension to upper vagina.  Post hysterectomy. 4. Innumerable HEPATIC METASTASIS. 5. Bilateral lower lobe pulmonary nodules consistent with PULMONARY METASTASIS. Findings conveyed toKommer, MD on 10/30/2023  at14:03. Electronically Signed   By: Genevive Bi M.D.   On: 10/30/2023 14:03   US Abdomen Limited RUQ (LIVER/GB) Result Date: 10/30/2023 CLINICAL DATA:  [id number] Transaminitis [id number] EXAM: ULTRASOUND ABDOMEN LIMITED RIGHT UPPER QUADRANT COMPARISON:  None Available. FINDINGS: Gallbladder: Partially contracted. No gallstones or wall thickening visualized. No sonographic Murphy sign noted by sonographer. Common bile duct: Diameter: 6 mm Liver: Diffusely heterogeneous appearance of the liver with coarsened echotexture and generally increased parenchymal echogenicity. Possible solid mass in the caudate lobe measuring 4.5 cm. Nodular hepatic surface contour. Portal vein is patent on color Doppler imaging with normal direction of blood flow towards the liver. Other: None. IMPRESSION: 1. Cirrhotic morphology of the liver with possible solid mass in the caudate lobe measuring 4.5 cm.  Further evaluation with contrast-enhanced MRI of the abdomen is recommended. 2. No evidence of cholelithiasis or acute cholecystitis. Electronically Signed   By: Duanne Guess D.O.   On: 10/30/2023 13:16   DG Chest 2 View Result Date: 10/30/2023 CLINICAL DATA:  Shortness of breath EXAM: CHEST - 2 VIEW COMPARISON:  09/03/2015 FINDINGS: The heart size and mediastinal contours are within normal limits. No focal airspace consolidation, pleural effusion, or pneumothorax. The visualized skeletal structures are unremarkable. IMPRESSION: No active cardiopulmonary disease. Electronically Signed   By: Duanne Guess D.O.   On: 10/30/2023 12:00     Signed, Lorin Glass, MD [facility] 10/30/2023

## 2023-10-30 NOTE — ED Notes (Signed)
 Patient transported to X-ray

## 2023-10-30 NOTE — ED Notes (Signed)
 Patient transported to CT

## 2023-10-31 ENCOUNTER — Observation Stay (HOSPITAL_COMMUNITY)

## 2023-10-31 DIAGNOSIS — R748 Abnormal levels of other serum enzymes: Secondary | ICD-10-CM

## 2023-10-31 DIAGNOSIS — D75839 Thrombocytosis, unspecified: Secondary | ICD-10-CM | POA: Diagnosis present

## 2023-10-31 DIAGNOSIS — R112 Nausea with vomiting, unspecified: Secondary | ICD-10-CM | POA: Diagnosis not present

## 2023-10-31 DIAGNOSIS — T447X6A Underdosing of beta-adrenoreceptor antagonists, initial encounter: Secondary | ICD-10-CM | POA: Diagnosis present

## 2023-10-31 DIAGNOSIS — D509 Iron deficiency anemia, unspecified: Secondary | ICD-10-CM | POA: Diagnosis not present

## 2023-10-31 DIAGNOSIS — I5022 Chronic systolic (congestive) heart failure: Secondary | ICD-10-CM

## 2023-10-31 DIAGNOSIS — E8721 Acute metabolic acidosis: Secondary | ICD-10-CM | POA: Diagnosis present

## 2023-10-31 DIAGNOSIS — I110 Hypertensive heart disease with heart failure: Secondary | ICD-10-CM | POA: Diagnosis present

## 2023-10-31 DIAGNOSIS — I428 Other cardiomyopathies: Secondary | ICD-10-CM

## 2023-10-31 DIAGNOSIS — D123 Benign neoplasm of transverse colon: Secondary | ICD-10-CM | POA: Diagnosis not present

## 2023-10-31 DIAGNOSIS — D120 Benign neoplasm of cecum: Secondary | ICD-10-CM | POA: Diagnosis not present

## 2023-10-31 DIAGNOSIS — F17210 Nicotine dependence, cigarettes, uncomplicated: Secondary | ICD-10-CM | POA: Diagnosis present

## 2023-10-31 DIAGNOSIS — N1411 Contrast-induced nephropathy: Secondary | ICD-10-CM | POA: Diagnosis not present

## 2023-10-31 DIAGNOSIS — K6389 Other specified diseases of intestine: Secondary | ICD-10-CM | POA: Diagnosis not present

## 2023-10-31 DIAGNOSIS — K7460 Unspecified cirrhosis of liver: Secondary | ICD-10-CM | POA: Diagnosis present

## 2023-10-31 DIAGNOSIS — D72829 Elevated white blood cell count, unspecified: Secondary | ICD-10-CM | POA: Diagnosis present

## 2023-10-31 DIAGNOSIS — D122 Benign neoplasm of ascending colon: Secondary | ICD-10-CM | POA: Diagnosis not present

## 2023-10-31 DIAGNOSIS — E875 Hyperkalemia: Secondary | ICD-10-CM | POA: Diagnosis not present

## 2023-10-31 DIAGNOSIS — C20 Malignant neoplasm of rectum: Secondary | ICD-10-CM | POA: Diagnosis present

## 2023-10-31 DIAGNOSIS — C7802 Secondary malignant neoplasm of left lung: Secondary | ICD-10-CM | POA: Diagnosis present

## 2023-10-31 DIAGNOSIS — Z515 Encounter for palliative care: Secondary | ICD-10-CM | POA: Diagnosis not present

## 2023-10-31 DIAGNOSIS — R9431 Abnormal electrocardiogram [ECG] [EKG]: Secondary | ICD-10-CM | POA: Diagnosis not present

## 2023-10-31 DIAGNOSIS — T463X6A Underdosing of coronary vasodilators, initial encounter: Secondary | ICD-10-CM | POA: Diagnosis present

## 2023-10-31 DIAGNOSIS — D649 Anemia, unspecified: Secondary | ICD-10-CM

## 2023-10-31 DIAGNOSIS — C7801 Secondary malignant neoplasm of right lung: Secondary | ICD-10-CM | POA: Diagnosis present

## 2023-10-31 DIAGNOSIS — C7A8 Other malignant neuroendocrine tumors: Secondary | ICD-10-CM | POA: Diagnosis present

## 2023-10-31 DIAGNOSIS — T500X6A Underdosing of mineralocorticoids and their antagonists, initial encounter: Secondary | ICD-10-CM | POA: Diagnosis present

## 2023-10-31 DIAGNOSIS — C775 Secondary and unspecified malignant neoplasm of intrapelvic lymph nodes: Secondary | ICD-10-CM | POA: Diagnosis present

## 2023-10-31 DIAGNOSIS — D124 Benign neoplasm of descending colon: Secondary | ICD-10-CM | POA: Diagnosis not present

## 2023-10-31 DIAGNOSIS — R7989 Other specified abnormal findings of blood chemistry: Secondary | ICD-10-CM

## 2023-10-31 DIAGNOSIS — K7290 Hepatic failure, unspecified without coma: Secondary | ICD-10-CM | POA: Diagnosis not present

## 2023-10-31 DIAGNOSIS — Z66 Do not resuscitate: Secondary | ICD-10-CM | POA: Diagnosis not present

## 2023-10-31 DIAGNOSIS — K6289 Other specified diseases of anus and rectum: Secondary | ICD-10-CM | POA: Diagnosis not present

## 2023-10-31 DIAGNOSIS — C189 Malignant neoplasm of colon, unspecified: Secondary | ICD-10-CM | POA: Diagnosis not present

## 2023-10-31 DIAGNOSIS — D72828 Other elevated white blood cell count: Secondary | ICD-10-CM | POA: Diagnosis not present

## 2023-10-31 DIAGNOSIS — K5900 Constipation, unspecified: Secondary | ICD-10-CM | POA: Diagnosis present

## 2023-10-31 DIAGNOSIS — E871 Hypo-osmolality and hyponatremia: Secondary | ICD-10-CM | POA: Diagnosis not present

## 2023-10-31 DIAGNOSIS — D630 Anemia in neoplastic disease: Secondary | ICD-10-CM | POA: Diagnosis present

## 2023-10-31 DIAGNOSIS — E43 Unspecified severe protein-calorie malnutrition: Secondary | ICD-10-CM | POA: Diagnosis present

## 2023-10-31 DIAGNOSIS — K7689 Other specified diseases of liver: Secondary | ICD-10-CM | POA: Diagnosis not present

## 2023-10-31 DIAGNOSIS — R140 Abdominal distension (gaseous): Secondary | ICD-10-CM | POA: Diagnosis present

## 2023-10-31 DIAGNOSIS — C787 Secondary malignant neoplasm of liver and intrahepatic bile duct: Secondary | ICD-10-CM | POA: Diagnosis present

## 2023-10-31 DIAGNOSIS — N179 Acute kidney failure, unspecified: Secondary | ICD-10-CM | POA: Diagnosis present

## 2023-10-31 DIAGNOSIS — T508X5A Adverse effect of diagnostic agents, initial encounter: Secondary | ICD-10-CM | POA: Diagnosis not present

## 2023-10-31 DIAGNOSIS — K769 Liver disease, unspecified: Secondary | ICD-10-CM

## 2023-10-31 DIAGNOSIS — K635 Polyp of colon: Secondary | ICD-10-CM | POA: Diagnosis present

## 2023-10-31 LAB — COMPREHENSIVE METABOLIC PANEL
ALT: 268 U/L — ABNORMAL HIGH (ref 0–44)
AST: 423 U/L — ABNORMAL HIGH (ref 15–41)
Albumin: 2.5 g/dL — ABNORMAL LOW (ref 3.5–5.0)
Alkaline Phosphatase: 585 U/L — ABNORMAL HIGH (ref 38–126)
Anion gap: 15 (ref 5–15)
BUN: 50 mg/dL — ABNORMAL HIGH (ref 6–20)
CO2: 21 mmol/L — ABNORMAL LOW (ref 22–32)
Calcium: 8.3 mg/dL — ABNORMAL LOW (ref 8.9–10.3)
Chloride: 99 mmol/L (ref 98–111)
Creatinine, Ser: 1.73 mg/dL — ABNORMAL HIGH (ref 0.44–1.00)
GFR, Estimated: 34 mL/min — ABNORMAL LOW (ref >60)
Glucose, Bld: 94 mg/dL (ref 70–99)
Potassium: 4.3 mmol/L (ref 3.5–5.1)
Sodium: 135 mmol/L (ref 135–145)
Total Bilirubin: 10.2 mg/dL — ABNORMAL HIGH (ref 0.0–1.2)
Total Protein: 6.3 g/dL — ABNORMAL LOW (ref 6.5–8.1)

## 2023-10-31 LAB — PROTIME-INR
INR: 1.3 — ABNORMAL HIGH (ref 0.8–1.2)
INR: 1.3 — ABNORMAL HIGH (ref 0.8–1.2)
Prothrombin Time: 15.9 s — ABNORMAL HIGH (ref 11.4–15.2)
Prothrombin Time: 15.9 s — ABNORMAL HIGH (ref 11.4–15.2)

## 2023-10-31 LAB — CBC
HCT: 28.3 % — ABNORMAL LOW (ref 36.0–46.0)
Hemoglobin: 9.9 g/dL — ABNORMAL LOW (ref 12.0–15.0)
MCH: 30.5 pg (ref 26.0–34.0)
MCHC: 35 g/dL (ref 30.0–36.0)
MCV: 87.1 fL (ref 80.0–100.0)
Platelets: 469 10*3/uL — ABNORMAL HIGH (ref 150–400)
RBC: 3.25 MIL/uL — ABNORMAL LOW (ref 3.87–5.11)
RDW: 17.9 % — ABNORMAL HIGH (ref 11.5–15.5)
WBC: 14.4 10*3/uL — ABNORMAL HIGH (ref 4.0–10.5)
nRBC: 0 % (ref 0.0–0.2)

## 2023-10-31 LAB — ECHOCARDIOGRAM COMPLETE
AR max vel: 2.21 cm2
AV Peak grad: 11.3 mmHg
Ao pk vel: 1.68 m/s
Area-P 1/2: 4.31 cm2
Height: 60 in
MV M vel: 2.71 m/s
MV Peak grad: 29.4 mmHg
S' Lateral: 2.9 cm
Weight: 2246.93 [oz_av]

## 2023-10-31 LAB — HIV ANTIBODY (ROUTINE TESTING W REFLEX): HIV Screen 4th Generation wRfx: NONREACTIVE

## 2023-10-31 MED ORDER — ONDANSETRON 4 MG PO TBDP
4.0000 mg | ORAL_TABLET | Freq: Once | ORAL | Status: AC | PRN
  Administered 2023-10-31: 4 mg via ORAL
  Filled 2023-10-31: qty 1

## 2023-10-31 MED ORDER — BISACODYL 5 MG PO TBEC
10.0000 mg | DELAYED_RELEASE_TABLET | Freq: Once | ORAL | Status: AC
  Administered 2023-10-31: 10 mg via ORAL
  Filled 2023-10-31: qty 2

## 2023-10-31 MED ORDER — ENOXAPARIN SODIUM 30 MG/0.3ML IJ SOSY
30.0000 mg | PREFILLED_SYRINGE | INTRAMUSCULAR | Status: DC
  Administered 2023-11-03 – 2023-11-04 (×2): 30 mg via SUBCUTANEOUS
  Filled 2023-10-31 (×3): qty 0.3

## 2023-10-31 MED ORDER — POLYETHYLENE GLYCOL 3350 17 GM/SCOOP PO POWD
238.0000 g | Freq: Once | ORAL | Status: AC
  Administered 2023-10-31: 238 g via ORAL
  Filled 2023-10-31 (×2): qty 238

## 2023-10-31 NOTE — Progress Notes (Addendum)
 Progress Note   Patient: Kim Underwood ZOX:[id number] DOB: [birth year] DOA: 10/30/2023     0 DOS: the patient was seen and examined on 10/31/2023   Brief hospital course: Sherly Underwood is a 58 y.o. female with PMH significant for hypertension, chronic systolic CHF, NICM with improved EF presented for evaluation of abdominal bloating, fullness. CT abdomen/ pelvis showed- Bulky rectal mass with extension to the presacral space most consistent with primary rectal adenocarcinoma. She is admitted to [facility] for further management.  Assessment and Plan: Rectal mass Suspicion for metastatic rectal adenocarcinoma. GI evaluation appreciated. She is to go for colonoscopy for tissue sample. Advance diet as tolerated.  Elevated liver enzymes: Possibly secondary to metastasis. Liver cirrhosis noted on ultrasound. Acute hepatitis panel nonreactive. Bili elevated to 10.2. Continue to trend LFTs.  Acute kidney injury: Possibly in the setting of poor oral intake. Continue to monitor daily renal function. Avoid nephrotoxic drugs.  Leukocytosis Reactive. Will continue to monitor daily CBC.  History of systolic CHF Nonischemic cardiomyopathy Last echocardiogram 2022 with EF recovered to 50%. Patient is noncompliant with Coreg, Imdur, Entresto and Aldactone.  Will get Echocardiogram prior to surgical resection and oncology treatment if needed.  Hypertension: Blood pressure lower side. Caution with antihypertensives.   Out of bed to chair. Incentive spirometry. Nursing supportive care. Fall, aspiration precautions. Diet:  Diet Orders (From admission, onward)     Start     Ordered   11/01/23 0001  Diet NPO time specified Except for: Sips with Meds  Diet effective midnight       Question:  Except for  Answer:  Sips with Meds   10/31/23 0954   10/31/23 0500  Diet clear liquid Room service appropriate? Yes; Fluid consistency: Thin  Diet effective 0500 tomorrow       Question Answer  Comment  Room service appropriate? Yes   Fluid consistency: Thin      10/30/23 1659           DVT prophylaxis: enoxaparin (LOVENOX) injection 30 mg Start: 11/02/23 2200  Level of care: tele med   Code Status: Full Code  Subjective: Patient is seen and examined today morning. She is lying comfortably. Has abdominal bloating, unable to tolerate diet. Has constipation. No other complaints.  Physical Exam: Vitals:   10/30/23 2340 10/31/23 0452 10/31/23 0738 10/31/23 1041  BP: 97/64 (!) 103/53 103/64   Pulse: 81 86 88   Resp: 19 20 18    Temp: 98.2 F (36.8 C) 98.2 F (36.8 C) 98.3 F (36.8 C) 98.3 F (36.8 C)  TempSrc: Oral Oral Oral Oral  SpO2: 94% 99% 98% 91%  Weight:  63.7 kg    Height:        General - Middle aged [place] American female, no apparent distress HEENT - PERRLA, EOMI, atraumatic head, non tender sinuses. Lung - Clear, diffuse rales, rhonchi, no wheezes. Heart - S1, S2 heard, no murmurs, rubs, no pedal edema. Abdomen - Soft, non tender, bowel sounds good Neuro - Alert, awake and oriented x 3, non focal exam. Skin - Warm and dry.  Data Reviewed:      Latest Ref Rng & Units 10/31/2023    2:58 AM 10/30/2023   10:41 AM 09/29/2023    8:38 AM  CBC  WBC 4.0 - 10.5 K/uL 14.4  15.2  8.4   Hemoglobin 12.0 - 15.0 g/dL 9.9  81.1  91.4   Hematocrit 36.0 - 46.0 % 28.3  30.8  34.8   Platelets 150 -  400 K/uL 469  536  265       Latest Ref Rng & Units 10/31/2023    2:58 AM 10/30/2023   10:41 AM 03/26/2022    9:43 AM  BMP  Glucose 70 - 99 mg/dL 94  644  034   BUN 6 - 20 mg/dL 50  42  13   Creatinine 0.44 - 1.00 mg/dL 7.42  5.95  6.38   Sodium 135 - 145 mmol/L 135  135  138   Potassium 3.5 - 5.1 mmol/L 4.3  4.1  3.7   Chloride 98 - 111 mmol/L 99  100  106   CO2 22 - 32 mmol/L 21  20  24    Calcium 8.9 - 10.3 mg/dL 8.3  8.7  8.9    CT ABDOMEN PELVIS W CONTRAST Result Date: 10/30/2023 CLINICAL DATA:  Abdominal pain and bloating.  Abnormal ultrasound EXAM: CT  ABDOMEN AND PELVIS WITH CONTRAST TECHNIQUE: Multidetector CT imaging of the abdomen and pelvis was performed using the standard protocol following bolus administration of intravenous contrast. RADIATION DOSE REDUCTION: This exam was performed according to the departmental dose-optimization program which includes automated exposure control, adjustment of the mA and/or kV according to patient size and/or use of iterative reconstruction technique. CONTRAST:  75mL OMNIPAQUE IOHEXOL 350 MG/ML SOLN COMPARISON:  Ultrasound same day FINDINGS: Lower chest: Bilateral round lower lobe pulmonary nodules. Nodules of varying size. Nodule in RIGHT lower lobe medially along the pleural surface measures 14 mm (image 12). Nodule LEFT lower lobe measures 5 mm on image 7. Proximally 3-6 nodules in each lung base. Hepatobiliary: Innumerable hypoenhancing lesions within LEFT and RIGHT hepatic lobe. The liver is expanded by this near confluent pattern. Largest lesion in the RIGHT hepatic lobe measures 6.5 cm (image 14/3). Small example lesion in the LEFT hepatic lobe measures 2 cm image 18/3. These lesions are too numerous to count range in size from 10 mm to 65 mm. Gallbladder collapsed Pancreas: Pancreas is normal. No ductal dilatation. No pancreatic inflammation. Spleen: Normal spleen Adrenals/urinary tract: Adrenal glands and kidneys are normal. The ureters and bladder normal. Stomach/Bowel: Stomach, duodenum small-bowel normal. Appendix not identified. Ascending and transverse colon normal. Descending colon normal. There is asymmetric thickening in the mid and distal rectum along the LEFT wall of the rectum measuring 3.2 cm (image 64/3). Bulky endoluminal rectal mass is well seen on coronal imaging measuring 4.8 cm (image 26/6). enlarged lymph nodes in the LEFT mesorectum measuring 10-12 mm on image 64/3. Exophytic extension from the rectum to the presacral space measuring 3.7 cm on image 60/3 Vascular/Lymphatic: No upper abdominal  adenopathy. Axial adenopathy described above. Reproductive: Post hysterectomy anatomy. There is irregularity in the upper aspect of the vagina/vaginal cuff (image 63/3) Other: No free fluid. Musculoskeletal: No aggressive osseous lesion. IMPRESSION: 1. Bulky rectal mass with extension to the presacral space most consistent with primary rectal adenocarcinoma. No evidence of bowel obstruction. 2. Local nodal metastasis to the mesorectum. 3. Potential local extension to upper vagina.  Post hysterectomy. 4. Innumerable HEPATIC METASTASIS. 5. Bilateral lower lobe pulmonary nodules consistent with PULMONARY METASTASIS. Findings conveyed toKommer, MD on 10/30/2023  at14:03. Electronically Signed   By: Genevive Bi M.D.   On: 10/30/2023 14:03   US Abdomen Limited RUQ (LIVER/GB) Result Date: 10/30/2023 CLINICAL DATA:  [id number] Transaminitis [id number] EXAM: ULTRASOUND ABDOMEN LIMITED RIGHT UPPER QUADRANT COMPARISON:  None Available. FINDINGS: Gallbladder: Partially contracted. No gallstones or wall thickening visualized. No sonographic Murphy sign noted by sonographer. Common bile  duct: Diameter: 6 mm Liver: Diffusely heterogeneous appearance of the liver with coarsened echotexture and generally increased parenchymal echogenicity. Possible solid mass in the caudate lobe measuring 4.5 cm. Nodular hepatic surface contour. Portal vein is patent on color Doppler imaging with normal direction of blood flow towards the liver. Other: None. IMPRESSION: 1. Cirrhotic morphology of the liver with possible solid mass in the caudate lobe measuring 4.5 cm. Further evaluation with contrast-enhanced MRI of the abdomen is recommended. 2. No evidence of cholelithiasis or acute cholecystitis. Electronically Signed   By: Duanne Guess D.O.   On: 10/30/2023 13:16   DG Chest 2 View Result Date: 10/30/2023 CLINICAL DATA:  Shortness of breath EXAM: CHEST - 2 VIEW COMPARISON:  09/03/2015 FINDINGS: The heart size and mediastinal contours are  within normal limits. No focal airspace consolidation, pleural effusion, or pneumothorax. The visualized skeletal structures are unremarkable. IMPRESSION: No active cardiopulmonary disease. Electronically Signed   By: Duanne Guess D.O.   On: 10/30/2023 12:00    Family Communication: Discussed with patient, she understand and agree. All questions answered.  Disposition: Status is: changed to Inpatient Remains inpatient appropriate because: colonoscopy for tissue.  Planned Discharge Destination: Home     Time spent: 39 minutes  Author: Marcelino Duster, MD 10/31/2023 3:07 PM Secure chat 7am to 7pm For on call review [email or web address].

## 2023-10-31 NOTE — Consult Note (Signed)
 Consultation  Referring Provider: ERMD/Dahal Primary Care Physician:  Patient, No Pcp Per Primary Gastroenterologist: Unassigned  Reason for Consultation: Abdominal bloating, fullness, nausea and poor intake x 2 weeks  HPI: Kim Underwood is a 58 y.o. female with history of hypertension, nonischemic cardiomyopathy with, with improved EF.  As of last echo 2022 EF 50 to 55%. Patient has not had any prior GI issues and no prior colon surveillance. She presented to the ER yesterday after not feeling well over the past couple of weeks since she has developed general sense of abdominal fullness and bloating, associated nausea and inability to take much p.o.  She has had a couple of episodes of vomiting.  She says she has been trying to keep herself hydrated and has been drinking Ensure liquids primarily. Bowel movements have been fairly regular for her which generally has a bowel movement every couple of days.  She had not noticed any melena or hematochezia but says she did notice a tinge of red blood on the tissue today. She has not had any fever or chills.  She has lost about 4 pounds since onset of symptoms.  Workup in the ER yesterday initially with labs showing WBC of 15.2/hemoglobin 10.6 BUN 42/creatinine 1.4 LFTs-T. bili 10.  9/alk phos 663/AST 335/ALT 232  Abdominal ultrasound/right upper quadrant-showed coarsened hepatic echotexture and possible solid mass in the caudate lobe measuring 4.5 cm/cirrhotic morphology.  CT of the abdomen pelvis showed bilateral round lower lobe pulmonary nodules varying size.  Innumerable hypoenhancing lesions in the left and right hepatic lobe measures expanded by this near confluent pattern, largest lesion in the right hepatic lobe 6.5 cm, lesions are too numerous to count stomach unremarkable, colon normal, there is asymmetric thickening of the mid and distal rectum along the left wall measuring 3.2 cm and a bulky endoluminal rectal mass.  Enlarged  lymph nodes in the left mesorectum, and irregularity in the upper aspect of the vaginal cuff.  No ascites.      Past Medical History:  Diagnosis Date   CHF (congestive heart failure) (HCC)    Chronic systolic heart failure (HCC)    a. NICM b. ECHO (11/2012): EF 15-20%, Diff HK, grade IV DD, mod MR, LA mildly dilated c. RHC (02/2013): RA: 3, RV: 35/7, PA: 32/11 (19), PCWP: 6, PA sat 69%, Fick CO/CI: 4.6 / 2.8 d. ECHO (12/2013): EF 35-40%, diff HK, grade I DD, trivial MR, LA mildly dialted, RV nl   Hypertension, malignant    NICM (nonischemic cardiomyopathy) (HCC)    LHC (02/2013): Lmain: no sig. dz, LAD: luminal irregularities, LCx: luminal irregularities, co-dominant, RCA: luminal irregularities, rel small, co-dominant with LCx   Tobacco abuse     Past Surgical History:  Procedure Laterality Date   TUBAL LIGATION  2000    Prior to Admission medications   Medication Sig Start Date End Date Taking? Authorizing Provider  acetaminophen (TYLENOL) 325 MG tablet Take 650 mg by mouth every 4 (four) hours as needed for moderate pain.   Yes [provider]  carvedilol (COREG) 6.25 MG tablet TAKE 1 TABLET BY MOUTH TWICE DAILY WITH MEALS . APPOINTMENT REQUIRED FOR FUTURE REFILLS 09/01/23  Yes Laurey Morale, MD  cetirizine (ZYRTEC ALLERGY) 10 MG tablet Take 1 tablet (10 mg total) by mouth daily. Patient taking differently: Take 10 mg by mouth daily as needed for allergies. 08/07/22  Yes Wallis Bamberg, PA-C  hydrALAZINE (APRESOLINE) 50 MG tablet Take 1 tablet (50 mg total) by  mouth 2 (two) times daily. NEEDS FOLLOW UP APPOINTMENT FOR MORE REFILLS 05/20/23  Yes Laurey Morale, MD  isosorbide mononitrate (IMDUR) 30 MG 24 hr tablet Take 1 tablet (30 mg total) by mouth daily. NEEDS FOLLOW UP APPOINTMENT FOR MORE REFILLS 05/20/23  Yes Laurey Morale, MD  sacubitril-valsartan (ENTRESTO) 49-51 MG Take 1 tablet by mouth 2 (two) times daily. 10/05/22  Yes Laurey Morale, MD  spironolactone  (ALDACTONE) 25 MG tablet Take 1 tablet by mouth once daily 02/25/23  Yes Laurey Morale, MD  propylthiouracil (PTU) 50 MG tablet Take 1 tablet (50 mg total) by mouth daily in the afternoon. Patient not taking: Reported on 10/31/2023 10/19/23   Shamleffer, Konrad Dolores, MD    Current Facility-Administered Medications  Medication Dose Route Frequency Provider Last Rate Last Admin   acetaminophen (TYLENOL) tablet 650 mg  650 mg Oral Q6H PRN Dahal, Melina Schools, MD       Or   acetaminophen (TYLENOL) suppository 650 mg  650 mg Rectal Q6H PRN Dahal, Binaya, MD       albuterol (PROVENTIL) (2.5 MG/3ML) 0.083% nebulizer solution 2.5 mg  2.5 mg Nebulization Q6H PRN Dahal, Binaya, MD       bisacodyl (DULCOLAX) EC tablet 5 mg  5 mg Oral Daily PRN Dahal, Melina Schools, MD       enoxaparin (LOVENOX) injection 40 mg  40 mg Subcutaneous Q24H Dahal, Binaya, MD       hydrALAZINE (APRESOLINE) injection 10 mg  10 mg Intravenous Q6H PRN Dahal, Binaya, MD       HYDROmorphone (DILAUDID) injection 0.5 mg  0.5 mg Intravenous Q4H PRN Dahal, Binaya, MD       polyethylene glycol (MIRALAX / GLYCOLAX) packet 17 g  17 g Oral Daily PRN Lorin Glass, MD        Allergies as of 10/30/2023   (No Known Allergies)    Family History  Problem Relation Age of Onset   Stroke Mother    Heart disease Mother    Heart disease Father     Social History   Socioeconomic History   Marital status: Single    Spouse name: Not on file   Number of children: Not on file   Years of education: Not on file   Highest education level: Not on file  Occupational History   Not on file  Tobacco Use   Smoking status: Every Day    Current packs/day: 0.50    Types: Cigarettes   Smokeless tobacco: Never  Vaping Use   Vaping status: Never Used  Substance and Sexual Activity   Alcohol use: Not Currently    Comment: very rarely   Drug use: No   Sexual activity: Not Currently  Other Topics Concern   Not on file  Social History Narrative   Lives  in [place]. Smokes cigarettes 1/2 ppd, social Etoh but not to excess. Works at [employer].    Social Drivers of Corporate investment banker Strain: Not on file  Food Insecurity: No Food Insecurity (10/30/2023)   Hunger Vital Sign    Worried About Running Out of Food in the Last Year: Never true    Ran Out of Food in the Last Year: Never true  Transportation Needs: No Transportation Needs (10/30/2023)   PRAPARE - Administrator, Civil Service (Medical): No    Lack of Transportation (Non-Medical): No  Physical Activity: Not on file  Stress: Not on file  Social Connections: Not on file  Intimate Partner Violence: Not At Risk (10/30/2023)   Humiliation, Afraid, Rape, and Kick questionnaire    Fear of Current or Ex-Partner: No    Emotionally Abused: No    Physically Abused: No    Sexually Abused: No    Review of Systems: Pertinent positive and negative review of systems were noted in the above HPI section.  All other review of systems was otherwise negative.   Physical Exam: Vital signs in last 24 hours: Temp:  [97.8 F (36.6 C)-98.3 F (36.8 C)] 98.3 F (36.8 C) (03/16 0738) Pulse Rate:  [81-98] 88 (03/16 0738) Resp:  [17-21] 18 (03/16 0738) BP: (97-108)/(53-74) 103/64 (03/16 0738) SpO2:  [93 %-100 %] 98 % (03/16 0738) Weight:  [63.7 kg-65.8 kg] 63.7 kg (03/16 0452) Last BM Date :  (PTA) General:   Alert,  Well-developed, well-nourished, older African-American female, pleasant and cooperative in NAD Head:  Normocephalic and atraumatic. Eyes:  Sclera icteric, conjunctiva pink. Ears:  Normal auditory acuity. Nose:  No deformity, discharge,  or lesions. Mouth:  No deformity or lesions.   Neck:  Supple; no masses or thyromegaly. Lungs:  Clear throughout to auscultation.   No wheezes, crackles, or rhonchi.  Heart:  Regular rate and rhythm; no murmurs, clicks, rubs,  or gallops. Abdomen:  Soft, liver is enlarged and palpable 4 fingerbreadths below the  right costal margin, and across through the epigastrium, there is tenderness, no guarding or rebound.  No other palpable mass or splenomegaly.  Bowel sounds are present Rectal: Not done Msk:  Symmetrical without gross deformities. . Pulses:  Normal pulses noted. Extremities:  Without clubbing or edema. Neurologic:  Alert and  oriented x4;  grossly normal neurologically. Skin:  Intact without significant lesions or rashes.. Psych:  Alert and cooperative. Normal mood and affect.  Intake/Output from previous day: No intake/output data recorded. Intake/Output this shift: No intake/output data recorded.  Lab Results: Recent Labs    10/30/23 1041 10/31/23 0258  WBC 15.2* 14.4*  HGB 10.6* 9.9*  HCT 30.8* 28.3*  PLT 536* 469*   BMET Recent Labs    10/30/23 1041 10/31/23 0258  NA 135 135  K 4.1 4.3  CL 100 99  CO2 20* 21*  GLUCOSE 131* 94  BUN 42* 50*  CREATININE 1.41* 1.73*  CALCIUM 8.7* 8.3*   LFT Recent Labs    10/30/23 1041 10/31/23 0258  PROT 6.6 6.3*  ALBUMIN 2.7* 2.5*  AST 355* 423*  ALT 232* 268*  ALKPHOS 663* 585*  BILITOT 10.9* 10.2*  BILIDIR 6.7*  --    PT/INR Recent Labs    10/31/23 0258  LABPROT 15.9*  INR 1.3*   Hepatitis Panel Recent Labs    10/30/23 1132  HEPBSAG NON REACTIVE  HCVAB NON REACTIVE  HEPAIGM NON REACTIVE  HEPBIGM NON REACTIVE     IMPRESSION:  #42 58 year old African-American female with 2-week history of progressive abdominal fullness, bloating sensation, nausea and inability to take much p.o.  Workup in the ER yesterday unfortunately reveals what appears to be metastatic rectal cancer with innumerable large liver metastases, and probable pulmonary metastases  Her liver is very large on exam and suspect this is what has been causing symptoms of bloating and fullness.  #2 jaundice/elevated LFTs-secondary to extensive hepatic metastatic disease  #3 anemia, normocytic #4 mild AKI #5 history of hypertension #6.  History  of nonischemic cardiomyopathy  PLAN: Clear liquid diet today Will plan for colonoscopy tomorrow with Dr. Doy Hutching for tissue diagnosis  Colonoscopy was discussed  in detail with the patient today including indications risk and benefits and she is agreeable to proceed. Are not certain that she will be able to complete the bowel prep as she really has not been able to take much p.o. but she is willing to try.  Lab INR, CEA Please consult oncology  GI will follow with you   Kaula Klenke PA-C 10/31/2023, 9:38 AM

## 2023-10-31 NOTE — Progress Notes (Signed)
 Echocardiogram 2D Echocardiogram has been performed.  Kim Underwood 10/31/2023, 4:00 PM

## 2023-11-01 ENCOUNTER — Inpatient Hospital Stay (HOSPITAL_COMMUNITY): Admitting: Certified Registered Nurse Anesthetist

## 2023-11-01 ENCOUNTER — Encounter (HOSPITAL_COMMUNITY): Admission: EM | Disposition: E | Payer: Self-pay | Source: Home / Self Care | Attending: Internal Medicine

## 2023-11-01 DIAGNOSIS — D122 Benign neoplasm of ascending colon: Secondary | ICD-10-CM | POA: Diagnosis not present

## 2023-11-01 DIAGNOSIS — D123 Benign neoplasm of transverse colon: Secondary | ICD-10-CM | POA: Diagnosis not present

## 2023-11-01 DIAGNOSIS — I110 Hypertensive heart disease with heart failure: Secondary | ICD-10-CM

## 2023-11-01 DIAGNOSIS — D124 Benign neoplasm of descending colon: Secondary | ICD-10-CM

## 2023-11-01 DIAGNOSIS — D120 Benign neoplasm of cecum: Secondary | ICD-10-CM

## 2023-11-01 DIAGNOSIS — K6389 Other specified diseases of intestine: Secondary | ICD-10-CM | POA: Diagnosis not present

## 2023-11-01 DIAGNOSIS — N179 Acute kidney failure, unspecified: Secondary | ICD-10-CM | POA: Diagnosis not present

## 2023-11-01 DIAGNOSIS — R748 Abnormal levels of other serum enzymes: Secondary | ICD-10-CM | POA: Diagnosis not present

## 2023-11-01 DIAGNOSIS — I5022 Chronic systolic (congestive) heart failure: Secondary | ICD-10-CM

## 2023-11-01 DIAGNOSIS — D75839 Thrombocytosis, unspecified: Secondary | ICD-10-CM

## 2023-11-01 DIAGNOSIS — D509 Iron deficiency anemia, unspecified: Secondary | ICD-10-CM | POA: Diagnosis not present

## 2023-11-01 DIAGNOSIS — K6289 Other specified diseases of anus and rectum: Secondary | ICD-10-CM | POA: Diagnosis not present

## 2023-11-01 DIAGNOSIS — R140 Abdominal distension (gaseous): Secondary | ICD-10-CM

## 2023-11-01 DIAGNOSIS — C20 Malignant neoplasm of rectum: Secondary | ICD-10-CM | POA: Diagnosis not present

## 2023-11-01 DIAGNOSIS — F17210 Nicotine dependence, cigarettes, uncomplicated: Secondary | ICD-10-CM

## 2023-11-01 DIAGNOSIS — R7989 Other specified abnormal findings of blood chemistry: Secondary | ICD-10-CM | POA: Diagnosis not present

## 2023-11-01 HISTORY — PX: POLYPECTOMY: SHX5525

## 2023-11-01 HISTORY — PX: COLONOSCOPY: SHX5424

## 2023-11-01 LAB — COMPREHENSIVE METABOLIC PANEL
ALT: 293 U/L — ABNORMAL HIGH (ref 0–44)
AST: 422 U/L — ABNORMAL HIGH (ref 15–41)
Albumin: 2.5 g/dL — ABNORMAL LOW (ref 3.5–5.0)
Alkaline Phosphatase: 521 U/L — ABNORMAL HIGH (ref 38–126)
Anion gap: 14 (ref 5–15)
BUN: 54 mg/dL — ABNORMAL HIGH (ref 6–20)
CO2: 19 mmol/L — ABNORMAL LOW (ref 22–32)
Calcium: 8.2 mg/dL — ABNORMAL LOW (ref 8.9–10.3)
Chloride: 97 mmol/L — ABNORMAL LOW (ref 98–111)
Creatinine, Ser: 2.2 mg/dL — ABNORMAL HIGH (ref 0.44–1.00)
GFR, Estimated: 25 mL/min — ABNORMAL LOW (ref >60)
Glucose, Bld: 102 mg/dL — ABNORMAL HIGH (ref 70–99)
Potassium: 4.3 mmol/L (ref 3.5–5.1)
Sodium: 130 mmol/L — ABNORMAL LOW (ref 135–145)
Total Bilirubin: 11.1 mg/dL — ABNORMAL HIGH (ref 0.0–1.2)
Total Protein: 6.3 g/dL — ABNORMAL LOW (ref 6.5–8.1)

## 2023-11-01 LAB — CEA: CEA: 1.5 ng/mL (ref 0.0–4.7)

## 2023-11-01 LAB — CBC WITH DIFFERENTIAL/PLATELET
Abs Immature Granulocytes: 0.14 10*3/uL — ABNORMAL HIGH (ref 0.00–0.07)
Basophils Absolute: 0.1 10*3/uL (ref 0.0–0.1)
Basophils Relative: 0 %
Eosinophils Absolute: 0 10*3/uL (ref 0.0–0.5)
Eosinophils Relative: 0 %
HCT: 28 % — ABNORMAL LOW (ref 36.0–46.0)
Hemoglobin: 10 g/dL — ABNORMAL LOW (ref 12.0–15.0)
Immature Granulocytes: 1 %
Lymphocytes Relative: 12 %
Lymphs Abs: 1.9 10*3/uL (ref 0.7–4.0)
MCH: 30.7 pg (ref 26.0–34.0)
MCHC: 35.7 g/dL (ref 30.0–36.0)
MCV: 85.9 fL (ref 80.0–100.0)
Monocytes Absolute: 1.5 10*3/uL — ABNORMAL HIGH (ref 0.1–1.0)
Monocytes Relative: 10 %
Neutro Abs: 11.3 10*3/uL — ABNORMAL HIGH (ref 1.7–7.7)
Neutrophils Relative %: 77 %
Platelets: 454 10*3/uL — ABNORMAL HIGH (ref 150–400)
RBC: 3.26 MIL/uL — ABNORMAL LOW (ref 3.87–5.11)
RDW: 18.1 % — ABNORMAL HIGH (ref 11.5–15.5)
WBC: 14.9 10*3/uL — ABNORMAL HIGH (ref 4.0–10.5)
nRBC: 0 % (ref 0.0–0.2)

## 2023-11-01 SURGERY — COLONOSCOPY
Anesthesia: Monitor Anesthesia Care

## 2023-11-01 MED ORDER — PHENYLEPHRINE 80 MCG/ML (10ML) SYRINGE FOR IV PUSH (FOR BLOOD PRESSURE SUPPORT)
PREFILLED_SYRINGE | INTRAVENOUS | Status: DC | PRN
Start: 2023-11-01 — End: 2023-11-01
  Administered 2023-11-01: 160 ug via INTRAVENOUS

## 2023-11-01 MED ORDER — SODIUM CHLORIDE 0.9 % IV SOLN
INTRAVENOUS | Status: AC | PRN
  Administered 2023-11-01: 1000 mL via INTRAMUSCULAR

## 2023-11-01 MED ORDER — PROPOFOL 500 MG/50ML IV EMUL
INTRAVENOUS | Status: DC | PRN
Start: 2023-11-01 — End: 2023-11-01
  Administered 2023-11-01: 75 ug/kg/min via INTRAVENOUS

## 2023-11-01 MED ORDER — ONDANSETRON HCL 4 MG/2ML IJ SOLN
INTRAMUSCULAR | Status: DC | PRN
  Administered 2023-11-01: 4 mg via INTRAVENOUS

## 2023-11-01 MED ORDER — PROPOFOL 10 MG/ML IV BOLUS
INTRAVENOUS | Status: DC | PRN
  Administered 2023-11-01: 30 mg via INTRAVENOUS
  Administered 2023-11-01: 20 mg via INTRAVENOUS

## 2023-11-01 MED ORDER — SPOT INK MARKER SYRINGE KIT
PACK | SUBMUCOSAL | Status: AC
  Filled 2023-11-01: qty 5

## 2023-11-01 NOTE — Transfer of Care (Signed)
 Immediate Anesthesia Transfer of Care Note  Patient: Kim Underwood  Procedure(s) Performed: COLONOSCOPY  Patient Location: Endoscopy Unit  Anesthesia Type:MAC  Level of Consciousness: drowsy and patient cooperative  Airway & Oxygen Therapy: Patient Spontanous Breathing and Patient connected to nasal cannula oxygen  Post-op Assessment: Report given to RN, Post -op Vital signs reviewed and stable, and Patient moving all extremities X 4  Post vital signs: Reviewed and stable  Last Vitals:  Vitals Value Taken Time  BP 99/67   Temp    Pulse 74 11/01/23 1025  Resp 30 11/01/23 1025  SpO2 93 % 11/01/23 1025  Vitals shown include unfiled device data.  Last Pain:  Vitals:   11/01/23 0837  TempSrc: Tympanic  PainSc: 0-No pain         Complications: No notable events documented.

## 2023-11-01 NOTE — Anesthesia Preprocedure Evaluation (Signed)
 Anesthesia Evaluation  Patient identified by MRN, date of birth, ID band Patient awake    Reviewed: Allergy & Precautions, H&P , NPO status , Patient's Chart, lab work & pertinent test results  Airway Mallampati: II   Neck ROM: full    Dental   Pulmonary Current Smoker   breath sounds clear to auscultation       Cardiovascular hypertension, +CHF   Rhythm:regular Rate:Normal     Neuro/Psych    GI/Hepatic Metastatic rectal CA   Endo/Other    Renal/GU Renal InsufficiencyRenal disease     Musculoskeletal   Abdominal   Peds  Hematology  (+) Blood dyscrasia, anemia   Anesthesia Other Findings   Reproductive/Obstetrics                             Anesthesia Physical Anesthesia Plan  ASA: 4  Anesthesia Plan: MAC   Post-op Pain Management:    Induction: Intravenous  PONV Risk Score and Plan: 1 and Propofol infusion and Treatment may vary due to age or medical condition  Airway Management Planned: Nasal Cannula  Additional Equipment:   Intra-op Plan:   Post-operative Plan:   Informed Consent: I have reviewed the patients History and Physical, chart, labs and discussed the procedure including the risks, benefits and alternatives for the proposed anesthesia with the patient or authorized representative who has indicated his/her understanding and acceptance.     Dental advisory given  Plan Discussed with: CRNA, Anesthesiologist and Surgeon  Anesthesia Plan Comments:        Anesthesia Quick Evaluation

## 2023-11-01 NOTE — TOC Initial Note (Addendum)
 Transition of Care [facility]) - Initial/Assessment Note    Patient Details  Name: Kim Underwood MRN: [id number] Date of Birth: 1965-11-28  Transition of [facility]) CM/SW Contact:    Leone Haven, RN Phone Number: 11/01/2023, 11:41 AM  Clinical Narrative:                  Per HIppa , Gentleman in the room, NCM asked if can speak in front of him , patient states yes can speak in front of Shawnie Dapper.  From home with SO Shawnie Dapper), has PCP  Olive Bass and insurance on file, states has no HH services in place at this time or DME at home.  States Shawnie Dapper will transport them home at [employer] and he is support system, the rest of her family is in [place],  states gets medications from [place] on [address] self ambulatory.   Expected Discharge Plan: Home/Self Care Barriers to Discharge: No Barriers Identified   Patient Goals and CMS Choice Patient states their goals for this hospitalization and ongoing recovery are:: return home   Choice offered to / list presented to : NA      Expected Discharge Plan and Services In-house Referral: NA Discharge Planning Services: CM Consult Post Acute Care Choice: NA Living arrangements for the past 2 months: Single Family Home                 DME Arranged: N/A DME Agency: NA       HH Arranged: NA          Prior Living Arrangements/Services Living arrangements for the past 2 months: Single Family Home Lives with:: Significant Other Shawnie Dapper) Patient language and need for interpreter reviewed:: Yes Do you feel safe going back to the place where you live?: Yes      Need for Family Participation in Patient Care: Yes (Comment) Care giver support system in place?: Yes (comment)   Criminal Activity/Legal Involvement Pertinent to Current Situation/Hospitalization: No - Comment as needed  Activities of Daily Living   ADL Screening (condition at time of admission) Independently performs  ADLs?: Yes (appropriate for developmental age) Is the patient deaf or have difficulty hearing?: No Does the patient have difficulty seeing, even when wearing glasses/contacts?: No Does the patient have difficulty concentrating, remembering, or making decisions?: No  Permission Sought/Granted Permission sought to share information with : Case Manager Permission granted to share information with : Yes, Verbal Permission Granted              Emotional Assessment Appearance:: Appears stated age Attitude/Demeanor/Rapport: Engaged Affect (typically observed): Appropriate Orientation: : Oriented to Self, Oriented to Place, Oriented to  Time, Oriented to Situation Alcohol / Substance Use: Not Applicable Psych Involvement: No (comment)  Admission diagnosis:  Rectal mass [K62.89] Primary colon cancer with metastasis to other site [facility]) [C18.9] Patient Active Problem List   Diagnosis Date Noted   Elevated liver enzymes 10/31/2023   Rectal mass 10/30/2023   Smoker 03/16/2013   Chronic systolic CHF (congestive heart failure) (HCC) 02/13/2013   Acute on chronic combined systolic and diastolic heart failure (HCC) 12/14/2012   Hypertension 09/21/2011   PCP:  Patient, No Pcp Per Pharmacy:   [id number] - [place],  - [address] [place] [address] Phone: [phone] Fax: [phone]     Social Drivers of Health (SDOH) Social History: SDOH Screenings   Food Insecurity: No Food Insecurity (10/30/2023)  Housing: Low Risk  (10/30/2023)  Transportation Needs: No Transportation Needs (10/30/2023)  Utilities: Not At Risk (10/30/2023)  Tobacco Use: High Risk (10/30/2023)   SDOH Interventions:     Readmission Risk Interventions    11/01/2023   11:38 AM  Readmission Risk Prevention Plan  Transportation Screening Complete  PCP or Specialist Appt within 5-7 Days Complete  Home Care Screening Complete  Medication Review (RN CM) Complete

## 2023-11-01 NOTE — Progress Notes (Signed)
 Progress Note   Patient: Kim Underwood UJW:[id number] DOB: 06/08/1966 DOA: 10/30/2023     1 DOS: the patient was seen and examined on 11/01/2023   Brief hospital course: Kim Underwood is a 58 y.o. female with PMH significant for hypertension, chronic systolic CHF, NICM with improved EF presented for evaluation of abdominal bloating, fullness. CT abdomen/ pelvis showed- Bulky rectal mass with extension to the presacral space most consistent with primary rectal adenocarcinoma. She is admitted to [facility] for further management.  Assessment and Plan: Rectal mass Suspicion for metastatic rectal adenocarcinoma. GI evaluation appreciated. She had colonoscopy today malignant tumor biopsied. Multiple polyps removed. Await pathology. Will consult oncology. Advance diet as tolerated, complains of bloating with eating.  Elevated liver enzymes: Possibly secondary to metastasis. Liver cirrhosis noted on ultrasound. Acute hepatitis panel nonreactive. Bili elevated to 11. Continue to trend LFTs.  Acute kidney injury: Possibly in the setting of poor oral intake. Continue to monitor daily renal function. Avoid nephrotoxic drugs.  Leukocytosis Thrombocytosis- Likely reactive. No source of infection. Trend cbc.  History of systolic CHF Nonischemic cardiomyopathy Last echocardiogram 2022 with EF recovered to 50%. Patient is noncompliant with Coreg, Imdur, Entresto and Aldactone.  Will get Echocardiogram prior to surgical resection and oncology treatment if needed.  Hypertension: Blood pressure lower side. Caution with antihypertensives.  Poor oral intake-  Dietician evaluation. Protein supplements advised.  Out of bed to chair. Incentive spirometry. Nursing supportive care. Fall, aspiration precautions. Diet:  Diet Orders (From admission, onward)     Start     Ordered   11/01/23 1151  Diet Heart Fluid consistency: Thin  Diet effective now       Question:  Fluid consistency:   Answer:  Thin   11/01/23 1150           DVT prophylaxis: enoxaparin (LOVENOX) injection 30 mg Start: 11/02/23 2200  Level of care: tele med   Code Status: Full Code  Subjective: Patient is seen and examined today morning. She is lying in bed, has abdominal distention. Unable to tolerate diet. Has constipation. Feels weak, did not get out of bed.  Physical Exam: Vitals:   11/01/23 1030 11/01/23 1040 11/01/23 1153 11/01/23 1622  BP: 94/65 111/73 (!) 98/54 108/72  Pulse: 75 77 80 94  Resp: (!) 23 (!) 25 20 20   Temp:   98 F (36.7 C) 98.9 F (37.2 C)  TempSrc:   Oral Oral  SpO2: 92% 93% 98% 100%  Weight:      Height:        General - Middle aged African American female, no apparent distress HEENT - PERRLA, EOMI, atraumatic head, non tender sinuses. Lung - Clear, diffuse rales, rhonchi, no wheezes. Heart - S1, S2 heard, no murmurs, rubs, no pedal edema. Abdomen - Soft, non tender, bowel sounds good Neuro - Alert, awake and oriented x 3, non focal exam. Skin - Warm and dry.  Data Reviewed:      Latest Ref Rng & Units 11/01/2023    3:30 AM 10/31/2023    2:58 AM 10/30/2023   10:41 AM  CBC  WBC 4.0 - 10.5 K/uL 14.9  14.4  15.2   Hemoglobin 12.0 - 15.0 g/dL 56.2  9.9  13.0   Hematocrit 36.0 - 46.0 % 28.0  28.3  30.8   Platelets 150 - 400 K/uL 454  469  536       Latest Ref Rng & Units 11/01/2023    3:30 AM 10/31/2023  2:58 AM 10/30/2023   10:41 AM  BMP  Glucose 70 - 99 mg/dL 846  94  962   BUN 6 - 20 mg/dL 54  50  42   Creatinine 0.44 - 1.00 mg/dL 9.52  8.41  3.24   Sodium 135 - 145 mmol/L 130  135  135   Potassium 3.5 - 5.1 mmol/L 4.3  4.3  4.1   Chloride 98 - 111 mmol/L 97  99  100   CO2 22 - 32 mmol/L 19  21  20    Calcium 8.9 - 10.3 mg/dL 8.2  8.3  8.7    ECHOCARDIOGRAM COMPLETE Result Date: 10/31/2023    ECHOCARDIOGRAM REPORT   Patient Name:   Kim Underwood Date of Exam: 10/31/2023 Medical Rec #:  [id number]            Height:       60.0 in Accession #:     [id number]           Weight:       140.4 lb Date of Birth:  [birth year]             BSA:          1.606 m Patient Age:    57 years             BP:           103/64 mmHg Patient Gender: F                    HR:           82 bpm. Exam Location:  Inpatient Procedure: 2D Echo, Cardiac Doppler, Color Doppler and Strain Analysis (Both            Spectral and Color Flow Doppler were utilized during procedure). Indications:    Abnormal ECG 794.31 / R94.31  History:        Patient has prior history of Echocardiogram examinations, most                 recent 11/26/2020. CHF; Risk Factors:Hypertension and Current                 Smoker.  Sonographer:    Lucendia Herrlich RCS Referring Phys: [id number] BINAYA DAHAL IMPRESSIONS  1. Left ventricular ejection fraction, by estimation, is 60 to 65%. The left ventricle has normal function. The left ventricle has no regional wall motion abnormalities. Left ventricular diastolic parameters are indeterminate.  2. Right ventricular systolic function is normal. The right ventricular size is normal.  3. The mitral valve is normal in structure. Trivial mitral valve regurgitation. No evidence of mitral stenosis.  4. Tricuspid valve regurgitation is mild to moderate.  5. The aortic valve is normal in structure. Aortic valve regurgitation is not visualized. No aortic stenosis is present.  6. The inferior vena cava is normal in size with greater than 50% respiratory variability, suggesting right atrial pressure of 3 mmHg. FINDINGS  Left Ventricle: Left ventricular ejection fraction, by estimation, is 60 to 65%. The left ventricle has normal function. The left ventricle has no regional wall motion abnormalities. Global longitudinal strain performed but not reported based on interpreter judgement due to suboptimal tracking. The left ventricular internal cavity size was normal in size. There is no left ventricular hypertrophy. Left ventricular diastolic parameters are indeterminate. Right Ventricle:  The right ventricular size is normal. No increase in right ventricular wall thickness. Right ventricular systolic function is normal. Left Atrium: Left  atrial size was normal in size. Right Atrium: Right atrial size was normal in size. Pericardium: There is no evidence of pericardial effusion. Mitral Valve: The mitral valve is normal in structure. Trivial mitral valve regurgitation. No evidence of mitral valve stenosis. Tricuspid Valve: The tricuspid valve is normal in structure. Tricuspid valve regurgitation is mild to moderate. No evidence of tricuspid stenosis. Aortic Valve: The aortic valve is normal in structure. Aortic valve regurgitation is not visualized. No aortic stenosis is present. Aortic valve peak gradient measures 11.3 mmHg. Pulmonic Valve: The pulmonic valve was normal in structure. Pulmonic valve regurgitation is mild. No evidence of pulmonic stenosis. Aorta: The aortic root is normal in size and structure. Venous: The inferior vena cava is normal in size with greater than 50% respiratory variability, suggesting right atrial pressure of 3 mmHg. IAS/Shunts: No atrial level shunt detected by color flow Doppler.  LEFT VENTRICLE PLAX 2D LVIDd:         4.30 cm   Diastology LVIDs:         2.90 cm   LV e' medial:    6.42 cm/s LV PW:         0.90 cm   LV E/e' medial:  7.9 LV IVS:        0.90 cm   LV e' lateral:   7.94 cm/s LVOT diam:     2.10 cm   LV E/e' lateral: 6.4 LV SV:         62 LV SV Index:   38 LVOT Area:     3.46 cm  RIGHT VENTRICLE             IVC RV S prime:     17.20 cm/s  IVC diam: 1.10 cm TAPSE (M-mode): 2.4 cm LEFT ATRIUM             Index        RIGHT ATRIUM          Index LA diam:        3.90 cm 2.43 cm/m   RA Area:     9.22 cm LA Vol (A2C):   35.9 ml 22.35 ml/m  RA Volume:   15.00 ml 9.34 ml/m LA Vol (A4C):   52.9 ml 32.94 ml/m LA Biplane Vol: 43.4 ml 27.02 ml/m  AORTIC VALVE AV Area (Vmax): 2.21 cm AV Vmax:        168.00 cm/s AV Peak Grad:   11.3 mmHg LVOT Vmax:      107.00 cm/s  LVOT Vmean:     70.600 cm/s LVOT VTI:       0.178 m  AORTA Ao Root diam: 3.40 cm Ao Asc diam:  3.40 cm MITRAL VALVE                TRICUSPID VALVE MV Area (PHT): 4.31 cm     TR Peak grad:   30.9 mmHg MV Decel Time: 176 msec     TR Vmax:        278.00 cm/s MR Peak grad: 29.4 mmHg MR Vmax:      271.00 cm/s   SHUNTS MV E velocity: 50.60 cm/s   Systemic VTI:  0.18 m MV A velocity: 100.00 cm/s  Systemic Diam: 2.10 cm MV E/A ratio:  0.51 Kardie Tobb DO Electronically signed by Thomasene Ripple DO Signature Date/Time: 10/31/2023/3:26:54 PM    Final     Family Communication: Discussed with patient, she understand and agree. All questions answered.  Disposition: Status is: changed to Inpatient  Remains inpatient appropriate because: colonoscopy for tissue.  Planned Discharge Destination: Home     Time spent: 40 minutes  Author: Marcelino Duster, MD 11/01/2023 6:33 PM Secure chat 7am to 7pm For on call review [email or web address].

## 2023-11-01 NOTE — Op Note (Signed)
 [facility] Patient Name: Kim Underwood Procedure Date : 11/01/2023 MRN: [id number] Attending MD: Maren Beach , MD, [id number] Date of Birth: Nov 05, 1965 CSN: [id number] Age: 58 Admit Type: Inpatient Procedure:                Colonoscopy Indications:              Generalized abdominal distress, Iron deficiency                            anemia, Abnormal CT of the GI tract - concern for                            rectal mass Providers:                Maren Beach, MD, Rogue Jury, RN, Sunday Corn                            [place]bumina, Technician Referring MD:              Medicines:                Monitored Anesthesia Care Complications:            No immediate complications. Estimated blood loss:                            Minimal. Estimated Blood Loss:     Estimated blood loss was minimal. Procedure:                Pre-Anesthesia Assessment:                           - Prior to the procedure, a History and Physical                            was performed, and patient medications and                            allergies were reviewed. The patient's tolerance of                            previous anesthesia was also reviewed. The risks                            and benefits of the procedure and the sedation                            options and risks were discussed with the patient.                            All questions were answered, and informed consent                            was obtained. Prior Anticoagulants: The patient has                            taken no anticoagulant  or antiplatelet agents. ASA                            Grade Assessment: IV - A patient with severe                            systemic disease that is a constant threat to life.                            After reviewing the risks and benefits, the patient                            was deemed in satisfactory condition to undergo the                            procedure.                            After obtaining informed consent, the colonoscope                            was passed under direct vision. Throughout the                            procedure, the patient's blood pressure, pulse, and                            oxygen saturations were monitored continuously. The                            CF-HQ190L ([id number]) Olympus coloscope was                            introduced through the anus and advanced to the the                            cecum, identified by appendiceal orifice and                            ileocecal valve. The colonoscopy was performed                            without difficulty. The patient tolerated the                            procedure well. The quality of the bowel                            preparation was good. The ileocecal valve,                            appendiceal orifice, and rectum were photographed. Scope In: 9:29:59 AM Scope Out: 10:19:49 AM Scope Withdrawal Time: 0 hours 40 minutes 41 seconds  Total Procedure Duration: 0 hours 49 minutes 50  seconds  Findings:      The perianal examination was normal.      The digital rectal exam revealed a nodular rectal mass.      A fungating and polypoid non-obstructing large mass was found in the       rectum abutting the anal verge. The mass was partially circumferential       (involving one-half of the lumen circumference). The mass measured 6 cm       in length. Biopsies were taken with a cold forceps for histology. Area 3       cm proximal to the rectal mass was tattooed with an injection of 2.5 mL       of Spot (carbon black).      Ten sessile polyps were found in the descending colon (2), splenic       flexure(2), transverse colon (3), hepatic flexure (2) and ascending       colon (2). The polyps were 5 to 9 mm in size. These polyps were removed       with a cold snare. Resection and retrieval were complete.      A localized area of nodular mucosa/polypoid was found at the ileocecal        valve involving the os of the ICV. This may represent polyp or inverted       ileal tissue. Biopsies were taken with a cold forceps for histology. Impression:               - Rectal mass.                           - Malignant tumor in the rectum. Biopsied. Tattooed.                           - Ten 5 to 9 mm polyps in the descending colon, at                            the splenic flexure, in the transverse colon, at                            the hepatic flexure and in the ascending colon,                            removed with a cold snare. Resected and retrieved.                           - Nodular mucosa at the ileocecal valve. Biopsied. Recommendation:           - Return patient to hospital ward for ongoing care.                           - Await pathology results.                           - Oncology consult. Procedure Code(s):        --- Professional ---                           [phone], Colonoscopy, flexible; with removal of  tumor(s), polyp(s), or other lesion(s) by snare                            technique                           45380, 59, Colonoscopy, flexible; with biopsy,                            single or multiple                           45381, Colonoscopy, flexible; with directed                            submucosal injection(s), any substance Diagnosis Code(s):        --- Professional ---                           K62.89, Other specified diseases of anus and rectum                           C20, Malignant neoplasm of rectum                           D12.4, Benign neoplasm of descending colon                           D12.3, Benign neoplasm of transverse colon (hepatic                            flexure or splenic flexure)                           D12.2, Benign neoplasm of ascending colon                           K63.89, Other specified diseases of intestine                           R10.84, Generalized abdominal pain                            D50.9, Iron deficiency anemia, unspecified                           R93.3, Abnormal findings on diagnostic imaging of                            other parts of digestive tract CPT copyright 2022 American Medical Association. All rights reserved. The codes documented in this report are preliminary and upon coder review may  be revised to meet current compliance requirements. Maren Beach, MD 11/01/2023 10:36:42 AM This report has been signed electronically. Number of Addenda: 0

## 2023-11-01 NOTE — H&P (Signed)
 [facility] History and Physical   Primary Care Physician:  Patient, No Pcp Per   Reason for Procedure:  Bloating, anemia, rectal mass on CT  Plan:    Colonoscopy     HPI: Kim Underwood is a 58 y.o. female undergoing diagnostic colonoscopy for evaluation of symptoms of bloating, anemia rectal mass on CT.  Patient reports a 2 to 3-week history of abdominal discomfort, bloating, nausea.  Labs noteworthy for hemoglobin 10.6 and total bilirubin 10.9.  CT imaging demonstrates rectal mass with numerous hepatic and pulmonary metastases.  No previous colonoscopy.  No family history of colorectal cancer.   Past Medical History:  Diagnosis Date   CHF (congestive heart failure) (HCC)    Chronic systolic heart failure (HCC)    a. NICM b. ECHO (11/2012): EF 15-20%, Diff HK, grade IV DD, mod MR, LA mildly dilated c. RHC (02/2013): RA: 3, RV: 35/7, PA: 32/11 (19), PCWP: 6, PA sat 69%, Fick CO/CI: 4.6 / 2.8 d. ECHO (12/2013): EF 35-40%, diff HK, grade I DD, trivial MR, LA mildly dialted, RV nl   Hypertension, malignant    NICM (nonischemic cardiomyopathy) (HCC)    LHC (02/2013): Lmain: no sig. dz, LAD: luminal irregularities, LCx: luminal irregularities, co-dominant, RCA: luminal irregularities, rel small, co-dominant with LCx   Tobacco abuse     Past Surgical History:  Procedure Laterality Date   TUBAL LIGATION  2000    Prior to Admission medications   Medication Sig Start Date End Date Taking? Authorizing Provider  acetaminophen (TYLENOL) 325 MG tablet Take 650 mg by mouth every 4 (four) hours as needed for moderate pain.   Yes [provider]  carvedilol (COREG) 6.25 MG tablet TAKE 1 TABLET BY MOUTH TWICE DAILY WITH MEALS . APPOINTMENT REQUIRED FOR FUTURE REFILLS 09/01/23  Yes Laurey Morale, MD  cetirizine (ZYRTEC ALLERGY) 10 MG tablet Take 1 tablet (10 mg total) by mouth daily. Patient taking differently: Take 10 mg by mouth daily as needed for allergies. 08/07/22   Yes Wallis Bamberg, PA-C  hydrALAZINE (APRESOLINE) 50 MG tablet Take 1 tablet (50 mg total) by mouth 2 (two) times daily. NEEDS FOLLOW UP APPOINTMENT FOR MORE REFILLS 05/20/23  Yes Laurey Morale, MD  isosorbide mononitrate (IMDUR) 30 MG 24 hr tablet Take 1 tablet (30 mg total) by mouth daily. NEEDS FOLLOW UP APPOINTMENT FOR MORE REFILLS 05/20/23  Yes Laurey Morale, MD  sacubitril-valsartan (ENTRESTO) 49-51 MG Take 1 tablet by mouth 2 (two) times daily. 10/05/22  Yes Laurey Morale, MD  spironolactone (ALDACTONE) 25 MG tablet Take 1 tablet by mouth once daily 02/25/23  Yes Laurey Morale, MD  propylthiouracil (PTU) 50 MG tablet Take 1 tablet (50 mg total) by mouth daily in the afternoon. Patient not taking: Reported on 10/31/2023 10/19/23   Shamleffer, Konrad Dolores, MD    Current Facility-Administered Medications  Medication Dose Route Frequency Provider Last Rate Last Admin   [MAR Hold] acetaminophen (TYLENOL) tablet 650 mg  650 mg Oral Q6H PRN Dahal, Melina Schools, MD       Or   Mitzi Hansen Hold] acetaminophen (TYLENOL) suppository 650 mg  650 mg Rectal Q6H PRN Dahal, Melina Schools, MD       [MAR Hold] albuterol (PROVENTIL) (2.5 MG/3ML) 0.083% nebulizer solution 2.5 mg  2.5 mg Nebulization Q6H PRN Dahal, Melina Schools, MD       [MAR Hold] enoxaparin (LOVENOX) injection 30 mg  30 mg Subcutaneous Q24H Esterwood, Amy S, PA-C       [facility]  Hold] hydrALAZINE (APRESOLINE) injection 10 mg  10 mg Intravenous Q6H PRN Dahal, Melina Schools, MD       [MAR Hold] HYDROmorphone (DILAUDID) injection 0.5 mg  0.5 mg Intravenous Q4H PRN Dahal, Melina Schools, MD       [MAR Hold] polyethylene glycol (MIRALAX / GLYCOLAX) packet 17 g  17 g Oral Daily PRN Lorin Glass, MD        Allergies as of 10/30/2023   (No Known Allergies)    Family History  Problem Relation Age of Onset   Stroke Mother    Heart disease Mother    Heart disease Father     Social History   Socioeconomic History   Marital status: Single    Spouse name: Not on file   Number  of children: Not on file   Years of education: Not on file   Highest education level: Not on file  Occupational History   Not on file  Tobacco Use   Smoking status: Every Day    Current packs/day: 0.50    Types: Cigarettes   Smokeless tobacco: Never  Vaping Use   Vaping status: Never Used  Substance and Sexual Activity   Alcohol use: Not Currently    Comment: very rarely   Drug use: No   Sexual activity: Not Currently  Other Topics Concern   Not on file  Social History Narrative   Lives in [place]. Smokes cigarettes 1/2 ppd, social Etoh but not to excess. Works at [employer].    Social Drivers of Corporate investment banker Strain: Not on file  Food Insecurity: No Food Insecurity (10/30/2023)   Hunger Vital Sign    Worried About Running Out of Food in the Last Year: Never true    Ran Out of Food in the Last Year: Never true  Transportation Needs: No Transportation Needs (10/30/2023)   PRAPARE - Administrator, Civil Service (Medical): No    Lack of Transportation (Non-Medical): No  Physical Activity: Not on file  Stress: Not on file  Social Connections: Not on file  Intimate Partner Violence: Not At Risk (10/30/2023)   Humiliation, Afraid, Rape, and Kick questionnaire    Fear of Current or Ex-Partner: No    Emotionally Abused: No    Physically Abused: No    Sexually Abused: No    Review of Systems:  All other review of systems negative except as mentioned in the HPI.  Physical Exam: Vital signs BP 106/65 (BP Location: Left Arm)   Pulse 89   Temp 97.6 F (36.4 C) (Oral)   Resp 20   Ht 5' (1.524 m)   Wt 63.7 kg   LMP 11/16/2012   SpO2 98%   BMI 27.43 kg/m   General:   Alert,  Well-developed, well-nourished, pleasant and cooperative in NAD Lungs:  Clear throughout to auscultation.   Heart:  Regular rate and rhythm; no murmurs, clicks, rubs,  or gallops. Abdomen:  Soft, mild tenderness to palpation in the upper abdomen, distention  is present normal bowel sounds.   Neuro/Psych:  Normal mood and affect. A and O x 3  Maren Beach, MD [facility]

## 2023-11-01 NOTE — Anesthesia Postprocedure Evaluation (Signed)
 Anesthesia Post Note  Patient: Kim Underwood  Procedure(s) Performed: COLONOSCOPY TATTOOING, USING INTRADERMAL PIGMENT POLYPECTOMY     Patient location during evaluation: Endoscopy Anesthesia Type: MAC Level of consciousness: awake and alert Pain management: pain level controlled Vital Signs Assessment: post-procedure vital signs reviewed and stable Respiratory status: spontaneous breathing, nonlabored ventilation, respiratory function stable and patient connected to nasal cannula oxygen Cardiovascular status: stable and blood pressure returned to baseline Postop Assessment: no apparent nausea or vomiting Anesthetic complications: no   No notable events documented.  Last Vitals:  Vitals:   11/01/23 1040 11/01/23 1153  BP: 111/73 (!) 98/54  Pulse: 77 80  Resp: (!) 25 20  Temp:  36.7 C  SpO2: 93% 98%    Last Pain:  Vitals:   11/01/23 1200  TempSrc:   PainSc: 0-No pain                 [name] S

## 2023-11-01 NOTE — Plan of Care (Signed)
  Problem: Education: Goal: Knowledge of General Education information will improve Description: Including pain rating scale, medication(s)/side effects and non-pharmacologic comfort measures Outcome: Progressing   Problem: Nutrition: Goal: Adequate nutrition will be maintained Outcome: Progressing   Problem: Pain Managment: Goal: General experience of comfort will improve and/or be controlled Outcome: Progressing   Problem: Skin Integrity: Goal: Risk for impaired skin integrity will decrease Outcome: Progressing   Problem: Clinical Measurements: Goal: Diagnostic test results will improve Outcome: Not Progressing   Problem: Elimination: Goal: Will not experience complications related to bowel motility Outcome: Not Progressing

## 2023-11-02 ENCOUNTER — Inpatient Hospital Stay (HOSPITAL_COMMUNITY)

## 2023-11-02 DIAGNOSIS — C7802 Secondary malignant neoplasm of left lung: Secondary | ICD-10-CM

## 2023-11-02 DIAGNOSIS — C787 Secondary malignant neoplasm of liver and intrahepatic bile duct: Secondary | ICD-10-CM | POA: Diagnosis not present

## 2023-11-02 DIAGNOSIS — R748 Abnormal levels of other serum enzymes: Secondary | ICD-10-CM | POA: Diagnosis not present

## 2023-11-02 DIAGNOSIS — N179 Acute kidney failure, unspecified: Secondary | ICD-10-CM | POA: Diagnosis not present

## 2023-11-02 DIAGNOSIS — F17200 Nicotine dependence, unspecified, uncomplicated: Secondary | ICD-10-CM

## 2023-11-02 DIAGNOSIS — C7801 Secondary malignant neoplasm of right lung: Secondary | ICD-10-CM | POA: Diagnosis not present

## 2023-11-02 DIAGNOSIS — N170 Acute kidney failure with tubular necrosis: Secondary | ICD-10-CM | POA: Insufficient documentation

## 2023-11-02 DIAGNOSIS — R7989 Other specified abnormal findings of blood chemistry: Secondary | ICD-10-CM | POA: Diagnosis not present

## 2023-11-02 DIAGNOSIS — K6289 Other specified diseases of anus and rectum: Secondary | ICD-10-CM | POA: Diagnosis not present

## 2023-11-02 DIAGNOSIS — E43 Unspecified severe protein-calorie malnutrition: Secondary | ICD-10-CM

## 2023-11-02 LAB — CBC
HCT: 29.5 % — ABNORMAL LOW (ref 36.0–46.0)
Hemoglobin: 10.4 g/dL — ABNORMAL LOW (ref 12.0–15.0)
MCH: 31 pg (ref 26.0–34.0)
MCHC: 35.3 g/dL (ref 30.0–36.0)
MCV: 87.8 fL (ref 80.0–100.0)
Platelets: 453 10*3/uL — ABNORMAL HIGH (ref 150–400)
RBC: 3.36 MIL/uL — ABNORMAL LOW (ref 3.87–5.11)
RDW: 18.6 % — ABNORMAL HIGH (ref 11.5–15.5)
WBC: 14.5 10*3/uL — ABNORMAL HIGH (ref 4.0–10.5)
nRBC: 0 % (ref 0.0–0.2)

## 2023-11-02 LAB — BASIC METABOLIC PANEL
Anion gap: 14 (ref 5–15)
BUN: 78 mg/dL — ABNORMAL HIGH (ref 6–20)
CO2: 16 mmol/L — ABNORMAL LOW (ref 22–32)
Calcium: 7.9 mg/dL — ABNORMAL LOW (ref 8.9–10.3)
Chloride: 98 mmol/L (ref 98–111)
Creatinine, Ser: 3.52 mg/dL — ABNORMAL HIGH (ref 0.44–1.00)
GFR, Estimated: 14 mL/min — ABNORMAL LOW (ref >60)
Glucose, Bld: 108 mg/dL — ABNORMAL HIGH (ref 70–99)
Potassium: 5.2 mmol/L — ABNORMAL HIGH (ref 3.5–5.1)
Sodium: 128 mmol/L — ABNORMAL LOW (ref 135–145)

## 2023-11-02 MED ORDER — ADULT MULTIVITAMIN W/MINERALS CH
1.0000 | ORAL_TABLET | Freq: Every day | ORAL | Status: DC
  Administered 2023-11-02 – 2023-11-04 (×2): 1 via ORAL
  Filled 2023-11-02 (×3): qty 1

## 2023-11-02 MED ORDER — PROCHLORPERAZINE EDISYLATE 10 MG/2ML IJ SOLN: 10.0000 mg | Freq: Three times a day (TID) | INTRAMUSCULAR | Status: DC | PRN

## 2023-11-02 MED ORDER — DRONABINOL 2.5 MG PO CAPS
5.0000 mg | ORAL_CAPSULE | Freq: Two times a day (BID) | ORAL | Status: DC
  Administered 2023-11-02 – 2023-11-04 (×5): 5 mg via ORAL
  Filled 2023-11-02 (×5): qty 2

## 2023-11-02 MED ORDER — ONDANSETRON HCL 4 MG/2ML IJ SOLN
INTRAMUSCULAR | Status: AC
  Administered 2023-11-02: 4 mg via INTRAVENOUS
  Filled 2023-11-02: qty 2

## 2023-11-02 MED ORDER — LACTATED RINGERS IV SOLN
INTRAVENOUS | Status: AC
Start: 2023-11-02 — End: 2023-11-03

## 2023-11-02 MED ORDER — ENSURE ENLIVE PO LIQD
237.0000 mL | Freq: Two times a day (BID) | ORAL | Status: DC
Start: 2023-11-02 — End: 2023-11-05
  Administered 2023-11-02 – 2023-11-04 (×4): 237 mL via ORAL

## 2023-11-02 MED ORDER — ONDANSETRON HCL 4 MG/2ML IJ SOLN
4.0000 mg | Freq: Four times a day (QID) | INTRAMUSCULAR | Status: DC | PRN
  Administered 2023-11-03 – 2023-11-05 (×3): 4 mg via INTRAVENOUS
  Filled 2023-11-02 (×3): qty 2

## 2023-11-02 MED ORDER — SODIUM ZIRCONIUM CYCLOSILICATE 10 G PO PACK
10.0000 g | PACK | Freq: Once | ORAL | Status: AC
  Administered 2023-11-02: 10 g via ORAL
  Filled 2023-11-02: qty 1

## 2023-11-02 NOTE — Progress Notes (Signed)
 Initial Nutrition Assessment  DOCUMENTATION CODES:   Severe malnutrition in context of acute illness/injury  INTERVENTION:  Liberalize diet to regular to encourage optimal po intake Ensure Enlive po BID, each supplement provides 350 kcal and 20 grams of protein. MVI with minerals daily "High Calorie, High Protein Nutrition Therapy" handout added to AVS  NUTRITION DIAGNOSIS:   Severe Malnutrition related to acute illness as evidenced by percent weight loss, severe muscle depletion, energy intake < or equal to 50% for > or equal to 5 days.  GOAL:   Patient will meet greater than or equal to 90% of their needs  MONITOR:   PO intake, Supplement acceptance, Labs, Weight trends  REASON FOR ASSESSMENT:   Consult Assessment of nutrition requirement/status  ASSESSMENT:   Pt admitted with abdominal fullness and bloating. PMH significant for HTN, chronic systolic CHF, NICM.   3/15: CT abdomen/pelvis with findings of rectal mass with numerous hepatic and pulmonary metastatic.  3/17: colonoscopy- malignant tumor in the rectum; 5-9mm polyps in the descending colon at the splenic flexure, and hepatic flexure; nodular mucosa at the ileocecal valve  Awaiting Oncology evaluation. Treatment plan pending.  If being seen outpatient at the Oncology Center, recommend follow up with outpatient RD.  Spoke with pt at bedside. She endorses eating  very minimally. Over the last 2 weeks, pt has had a very poor appetite and very little intake only tolerating bites, d/t abdominal bloating and early satiety.   No documented meal completions on file to review. During admission, pt continues to consume very little to only bites. She has been trying to drink Ensure at home but only once daily if able. She is amenable to continuing nutrition supplements during admission.   Pt reports that she has lost about 8 lbs but is uncertain when this weight loss began.   Limited weights on file to review within the  last year however over the last month, pt appears to have had a weight loss of 8.7% which is clinically significant for time frame.   Pt meets criteria for acute malnutrition given report of recent decline in PO intake as well as acute weight loss. Suspect malnutrition to be more chronic in nature given severity of muscle depletions and  Medications: reviewed  Labs:  Sodium 130 BUN 54 Cr 2.20 Alkaline phos 521 AST 422 ALT 293 Tbili 11.1 GFR 25  NUTRITION - FOCUSED PHYSICAL EXAM: Flowsheet Row Most Recent Value  Orbital Region No depletion  Upper Arm Region Severe depletion  Thoracic and Lumbar Region No depletion  Buccal Region No depletion  Temple Region No depletion  Clavicle Bone Region Severe depletion  Clavicle and Acromion Bone Region Severe depletion  Scapular Bone Region Moderate depletion  Dorsal Hand Moderate depletion  Patellar Region Mild depletion  Anterior Thigh Region Mild depletion  Posterior Calf Region Mild depletion  Edema (RD Assessment) None  Hair Reviewed  Eyes Reviewed  Mouth Reviewed  Skin Reviewed  Nails Reviewed    Diet Order:   Diet Order             Diet Heart Fluid consistency: Thin  Diet effective now                   EDUCATION NEEDS:  Education needs have been addressed  Skin:  Skin Assessment: Reviewed RN Assessment  Last BM:  3/16  Height:  Ht Readings from Last 1 Encounters:  10/30/23 5' (1.524 m)    Weight:  Wt Readings from Last 1  Encounters:  11/02/23 64 kg   BMI:  Body mass index is 27.56 kg/m.  Estimated Nutritional Needs:   Kcal:     Protein:     Fluid:     Drusilla Kanner, RDN, LDN Clinical Nutrition

## 2023-11-02 NOTE — Consult Note (Signed)
 Reason for Consult:Renal failure Referring Physician: Dr. Clide Dales  Chief Complaint: Abdominal fullness  Assessment/Plan: Acute renal failure secondary to contrast nephropathy context of relative hypotension with systolics in the 90s to 100s she is significant given that she is on multiple antihypertensives at home. -Will send urine lytes -PVR and renal ultrasound to ensure no obstruction with a large rectal mass. - Counseled the patient as she was not using the urinal hat and thus urine output is not recorded at all.  Need strict I's and O's. Thompson Caul x1 (already given), continue IVF for now. Antihypertensives have been on hold appropriately. -Hopefully she can avoid dialysis; with all the new findings she is understandably overwhelmed and depressed. -Monitor  Daily weight  -Maintain MAP>65 for optimal renal perfusion.  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  Status post CT chest. H/o NICM history of ejection fraction 15 to 20% in 2014 which has recovered to 50% in 2022 and 60 to 65% this hospitalization followed by Dr. Shirlee Latch.  She is certainly compensated. History of hypertension and antihypertensives are currently on hold    HPI: Kim Underwood is an 58 y.o. female with a history of hypertension,  NICM with ejection fraction of 15 to 20% back in 2014 which has recovered, tobacco use presenting with a 7 to 10-day history of abdominal fullness, bloating, nausea, anorexia.  Prior to coming to the hospital she did not have any vomiting or noted any bright red blood per rectum or dark stool.  They report that her stool caliber has been small lately.  In the emergency department her white count was 15.2 with a BUN/creatinine of 42 and 1.4 bilirubin of 10.9.  Ultrasound of the liver showed cirrhotic morphology with a mass in the  caudate lobe.  CT of the abdomen pelvis with contrast showed a rectal mass with extension into the presacral space but no bowel obstruction and local nodal metastases to the mesorectum and possible extension to the upper vagina.  Also there were numerous hepatic metastases and nodules in the lower lobes of the lung bilaterally.  Colonoscopy showed a fungating nonobstructing large mass in the rectum close to the anal verge as well as multiple sessile polyps in the descending colon.  Patient has noted some decreased urine output during this hospitalization, dry mouth but denies any dizziness on ambulation or dysuria.  Also denies any cough or shortness of breath but has had a very poor appetite and had nausea and vomiting this morning after drinking the Boost.  ROS Pertinent items are noted in HPI.  Chemistry and CBC: Creat  Date/Time Value Ref Range Status  12/12/2012 01:58 PM 0.72 0.50 - 1.10 mg/dL Final   Creatinine, Ser  Date/Time Value Ref Range Status  11/02/2023 10:12 AM 3.52 (H) 0.44 - 1.00 mg/dL Final    Comment:    DELTA CHECK NOTED  11/01/2023 03:30 AM 2.20 (H) 0.44 - 1.00 mg/dL Final  40/98/1191 47:82 AM 1.73 (H) 0.44 - 1.00 mg/dL Final  95/62/1308 65:78 AM 1.41 (H) 0.44 - 1.00 mg/dL Final  46/96/2952 84:13 AM 0.76 0.44 - 1.00 mg/dL Final  24/40/1027 25:36 PM 0.79 0.44 - 1.00 mg/dL Final  64/40/3474 25:95 AM 0.69 0.44 - 1.00 mg/dL Final  63/87/5643 32:95 AM 0.75 0.44 - 1.00 mg/dL Final  18/84/1660 63:01 AM 0.67 0.44 - 1.00 mg/dL Final  60/05/9322  11:31 AM 0.68 0.44 - 1.00 mg/dL Final  40/98/1191 47:82 AM 0.71 0.44 - 1.00 mg/dL Final  95/62/1308 65:78 AM 0.66 0.44 - 1.00 mg/dL Final  46/96/2952 84:13 AM 0.71 0.44 - 1.00 mg/dL Final  24/40/1027 25:36 PM 0.73 0.44 - 1.00 mg/dL Final  64/40/3474 25:95 AM 0.69 0.44 - 1.00 mg/dL Final  63/87/5643 32:95 PM 0.71 0.44 - 1.00 mg/dL Final  18/84/1660 63:01 AM 0.62 0.50 - 1.10 mg/dL Final  60/05/9322 55:73 PM 0.57 0.50 - 1.10 mg/dL Final   22/09/5425 06:23 AM 0.6 0.4 - 1.2 mg/dL Final  76/28/3151 76:16 AM 0.7 0.4 - 1.2 mg/dL Final  07/37/1062 69:48 AM 0.59 0.50 - 1.10 mg/dL Final  54/62/7035 00:93 AM 0.82 0.50 - 1.10 mg/dL Final  81/82/9937 16:96 PM 0.72 0.50 - 1.10 mg/dL Final  78/93/8101 75:10 AM 0.9 0.4 - 1.2 mg/dL Final  25/85/2778 24:23 AM 0.8 0.4 - 1.2 mg/dL Final  53/61/4431 54:00 AM 0.8 0.4 - 1.2 mg/dL Final   Recent Labs  Lab 10/30/23 1041 10/31/23 0258 11/01/23 0330 11/02/23 1012  NA 135 135 130* 128*  K 4.1 4.3 4.3 5.2*  CL 100 99 97* 98  CO2 20* 21* 19* 16*  GLUCOSE 131* 94 102* 108*  BUN 42* 50* 54* 78*  CREATININE 1.41* 1.73* 2.20* 3.52*  CALCIUM 8.7* 8.3* 8.2* 7.9*   Recent Labs  Lab 10/30/23 1041 10/31/23 0258 11/01/23 0330 11/02/23 1012  WBC 15.2* 14.4* 14.9* 14.5*  NEUTROABS 11.8*  --  11.3*  --   HGB 10.6* 9.9* 10.0* 10.4*  HCT 30.8* 28.3* 28.0* 29.5*  MCV 88.3 87.1 85.9 87.8  PLT 536* 469* 454* 453*   Liver Function Tests: Recent Labs  Lab 10/30/23 1041 10/31/23 0258 11/01/23 0330  AST 355* 423* 422*  ALT 232* 268* 293*  ALKPHOS 663* 585* 521*  BILITOT 10.9* 10.2* 11.1*  PROT 6.6 6.3* 6.3*  ALBUMIN 2.7* 2.5* 2.5*   Recent Labs  Lab 10/30/23 1041  LIPASE 109*   No results for input(s): "AMMONIA" in the last 168 hours. Cardiac Enzymes: No results for input(s): "CKTOTAL", "CKMB", "CKMBINDEX", "TROPONINI" in the last 168 hours. Iron Studies: No results for input(s): "IRON", "TIBC", "TRANSFERRIN", "FERRITIN" in the last 72 hours. PT/INR: @LABRCNTIP (inr:5)  Xrays/Other Studies: ) Results for orders placed or performed during the hospital encounter of 10/30/23 (from the past 48 hours)  CBC with Differential/Platelet     Status: Abnormal   Collection Time: 11/01/23  3:30 AM  Result Value Ref Range   WBC 14.9 (H) 4.0 - 10.5 K/uL   RBC 3.26 (L) 3.87 - 5.11 MIL/uL   Hemoglobin 10.0 (L) 12.0 - 15.0 g/dL   HCT 86.7 (L) 61.9 - 50.9 %   MCV 85.9 80.0 - 100.0 fL   MCH 30.7  26.0 - 34.0 pg   MCHC 35.7 30.0 - 36.0 g/dL   RDW 32.6 (H) 71.2 - 45.8 %   Platelets 454 (H) 150 - 400 K/uL   nRBC 0.0 0.0 - 0.2 %   Neutrophils Relative % 77 %   Neutro Abs 11.3 (H) 1.7 - 7.7 K/uL   Lymphocytes Relative 12 %   Lymphs Abs 1.9 0.7 - 4.0 K/uL   Monocytes Relative 10 %   Monocytes Absolute 1.5 (H) 0.1 - 1.0 K/uL   Eosinophils Relative 0 %   Eosinophils Absolute 0.0 0.0 - 0.5 K/uL   Basophils Relative 0 %   Basophils Absolute 0.1 0.0 - 0.1 K/uL   Immature Granulocytes 1 %  Abs Immature Granulocytes 0.14 (H) 0.00 - 0.07 K/uL    Comment: Performed at [facility], [address], [place], [place] [address]  Comprehensive metabolic panel     Status: Abnormal   Collection Time: 11/01/23  3:30 AM  Result Value Ref Range   Sodium 130 (L) 135 - 145 mmol/L   Potassium 4.3 3.5 - 5.1 mmol/L   Chloride 97 (L) 98 - 111 mmol/L   CO2 19 (L) 22 - 32 mmol/L   Glucose, Bld 102 (H) 70 - 99 mg/dL    Comment: Glucose reference range applies only to samples taken after fasting for at least 8 hours.   BUN 54 (H) 6 - 20 mg/dL   Creatinine, Ser 7.82 (H) 0.44 - 1.00 mg/dL   Calcium 8.2 (L) 8.9 - 10.3 mg/dL   Total Protein 6.3 (L) 6.5 - 8.1 g/dL   Albumin 2.5 (L) 3.5 - 5.0 g/dL   AST 956 (H) 15 - 41 U/L   ALT 293 (H) 0 - 44 U/L   Alkaline Phosphatase 521 (H) 38 - 126 U/L   Total Bilirubin 11.1 (H) 0.0 - 1.2 mg/dL   GFR, Estimated 25 (L) >60 mL/min    Comment: (NOTE) Calculated using the CKD-EPI Creatinine Equation (2021)    Anion gap 14 5 - 15    Comment: Performed at [facility], [address], [place], [place] [address]  Basic metabolic panel     Status: Abnormal   Collection Time: 11/02/23 10:12 AM  Result Value Ref Range   Sodium 128 (L) 135 - 145 mmol/L   Potassium 5.2 (H) 3.5 - 5.1 mmol/L   Chloride 98 98 - 111 mmol/L   CO2 16 (L) 22 - 32 mmol/L   Glucose, Bld 108 (H) 70 - 99 mg/dL    Comment: Glucose reference range applies only to samples taken after  fasting for at least 8 hours.   BUN 78 (H) 6 - 20 mg/dL   Creatinine, Ser 6.57 (H) 0.44 - 1.00 mg/dL    Comment: DELTA CHECK NOTED   Calcium 7.9 (L) 8.9 - 10.3 mg/dL   GFR, Estimated 14 (L) >60 mL/min    Comment: (NOTE) Calculated using the CKD-EPI Creatinine Equation (2021)    Anion gap 14 5 - 15    Comment: Performed at [facility], [address], [place], [place] [address]  CBC     Status: Abnormal   Collection Time: 11/02/23 10:12 AM  Result Value Ref Range   WBC 14.5 (H) 4.0 - 10.5 K/uL   RBC 3.36 (L) 3.87 - 5.11 MIL/uL   Hemoglobin 10.4 (L) 12.0 - 15.0 g/dL   HCT 29.5 (L) 28.4 - 13.2 %   MCV 87.8 80.0 - 100.0 fL   MCH 31.0 26.0 - 34.0 pg   MCHC 35.3 30.0 - 36.0 g/dL   RDW 44.0 (H) 10.2 - 72.5 %   Platelets 453 (H) 150 - 400 K/uL   nRBC 0.0 0.0 - 0.2 %    Comment: Performed at [facility], [address], [place], [place] [address]   No results found.  PMH:   Past Medical History:  Diagnosis Date   CHF (congestive heart failure) (HCC)    Chronic systolic heart failure (HCC)    a. NICM b. ECHO (11/2012): EF 15-20%, Diff HK, grade IV DD, mod MR, LA mildly dilated c. RHC (02/2013): RA: 3, RV: 35/7, PA: 32/11 (19), PCWP: 6, PA sat 69%,  Fick CO/CI: 4.6 / 2.8 d. ECHO (12/2013): EF 35-40%, diff HK, grade I DD, trivial MR, LA mildly dialted, RV nl   Hypertension, malignant    NICM (nonischemic cardiomyopathy) (HCC)    LHC (02/2013): Lmain: no sig. dz, LAD: luminal irregularities, LCx: luminal irregularities, co-dominant, RCA: luminal irregularities, rel small, co-dominant with LCx   Tobacco abuse     PSH:   Past Surgical History:  Procedure Laterality Date   TUBAL LIGATION  2000    Allergies: No Known Allergies  Medications:   Prior to Admission medications   Medication Sig Start Date End Date Taking? Authorizing Provider  acetaminophen (TYLENOL) 325 MG tablet Take 650 mg by mouth every 4 (four) hours as needed for moderate pain.   Yes [provider]  carvedilol (COREG) 6.25 MG tablet TAKE 1 TABLET BY MOUTH TWICE DAILY WITH MEALS . APPOINTMENT REQUIRED FOR FUTURE REFILLS 09/01/23  Yes Laurey Morale, MD  cetirizine (ZYRTEC ALLERGY) 10 MG tablet Take 1 tablet (10 mg total) by mouth daily. Patient taking differently: Take 10 mg by mouth daily as needed for allergies. 08/07/22  Yes Wallis Bamberg, PA-C  hydrALAZINE (APRESOLINE) 50 MG tablet Take 1 tablet (50 mg total) by mouth 2 (two) times daily. NEEDS FOLLOW UP APPOINTMENT FOR MORE REFILLS 05/20/23  Yes Laurey Morale, MD  isosorbide mononitrate (IMDUR) 30 MG 24 hr tablet Take 1 tablet (30 mg total) by mouth daily. NEEDS FOLLOW UP APPOINTMENT FOR MORE REFILLS 05/20/23  Yes Laurey Morale, MD  sacubitril-valsartan (ENTRESTO) 49-51 MG Take 1 tablet by mouth 2 (two) times daily. 10/05/22  Yes Laurey Morale, MD  spironolactone (ALDACTONE) 25 MG tablet Take 1 tablet by mouth once daily 02/25/23  Yes Laurey Morale, MD  propylthiouracil (PTU) 50 MG tablet Take 1 tablet (50 mg total) by mouth daily in the afternoon. Patient not taking: Reported on 10/31/2023 10/19/23   Shamleffer, Konrad Dolores, MD    Discontinued Meds:   Medications Discontinued During This Encounter  Medication Reason   enoxaparin (LOVENOX) injection 40 mg    buPROPion (WELLBUTRIN SR) 150 MG 12 hr tablet Patient Preference   predniSONE (DELTASONE) 10 MG tablet Completed Course   promethazine-dextromethorphan (PROMETHAZINE-DM) 6.25-15 MG/5ML syrup Completed Course   bisacodyl (DULCOLAX) EC tablet 5 mg    enoxaparin (LOVENOX) injection 40 mg     Social History:  reports that she has been smoking cigarettes. She has never used smokeless tobacco. She reports that she does not currently use alcohol. She reports that she does not use drugs.  Family History:   Family History  Problem Relation Age of Onset   Stroke Mother    Heart disease Mother    Heart disease Father     Blood pressure 103/67, pulse 91, temperature 97.9  F (36.6 C), temperature source Oral, resp. rate (!) 21, height 5' (1.524 m), weight 64 kg, last menstrual period 11/16/2012, SpO2 100%. General appearance: alert, cooperative, and appears stated age Head: Normocephalic, without obvious abnormality, atraumatic Eyes: negative Neck: no adenopathy, no carotid bruit, no JVD, supple, symmetrical, trachea midline, and thyroid not enlarged, symmetric, no tenderness/mass/nodules Back: symmetric, no curvature. ROM normal. No CVA tenderness. Cardio: regular rate and rhythm GI: distended Extremities: extremities normal, atraumatic, no cyanosis or edema Pulses: 2+ and symmetric Skin: Skin color, texture, turgor normal. No rashes or lesions       Iretta Mangrum, Len Blalock, MD 11/02/2023, 3:48 PM

## 2023-11-02 NOTE — Consult Note (Addendum)
 [facility] CONSULT NOTE  Patient Care Team: Patient, No Pcp Per as PCP - General (General Practice)  Addendum Patient seen with husband at bedside.  Agree with assessment and plan below by APP.  58 year old female with newly found diffuse liver lesions, pulmonary lesions, primary rectal tumor.  Final pathology pending.  Appears appear to be borderline.  She has AKI currently.  We discussed that if confirmed rectal cancer, then she has metastatic disease and in that case treatment is palliative in nature and not curative.  In that case, chemotherapy will be ongoing with repeat assessment.  Her AKI will need to be resolved, along with other organ functions able to receive cancer directed treatment.  Patient expressed she want to proceed with treatment if confirmed diagnosis.  Will obtain CT of the chest for evaluation Recommend liver biopsy Monitor daily renal and liver functions Please continue IV fluid and monitor for resolution of AKI If renal function not improved, or if worsening liver function, or worsening PS chemotherapy will be contraindicated If confirmed GI primary, will refer to GI oncology  Thank you for the consult.  CHIEF COMPLAINTS/PURPOSE OF CONSULTATION:  Rectal mass  REFERRING PHYSICIAN: Dr. Clide Dales  HISTORY OF PRESENTING ILLNESS:  Tariya Morrissette 58 y.o. female who presented to the ED on 10/30/2023 due to chief complaint of abdominal fullness and bloating.  Reported that her symptoms had started approximately 2 weeks prior to admission.  She also noted some nausea, poor appetite and epigastric area discomfort.   Workup was done in the ED which included blood work and imaging.  Ultrasound of liver showed cirrhotic liver with possible solid mass.  Subsequent CT abdomen pelvis showed bulky rectal mass consistent with primary rectal adenocarcinoma, nodal mets and innumerable hepatic mets.  Also showed bilateral lower lobe pulmonary mets.  Therefore oncology  consult has been requested. Patient is seen and assessed today.  Patient's spouse of 25 years at bedside.  She is awake alert and oriented x 3.  Relates chief complaint as listed above.  Reports that her last bowel movement was last week Thursday. Medical history includes CHF, hypertension and nonischemic cardiomyopathy. Surgical history is noncontributory, includes tubal ligation. Family history is includes maternal grandmother with unknown cancer. Social history-admits to tobacco use 1 pack every 6 to 7 days, started smoking at age 51 years old.  Denies current alcohol use, admits to being former social alcohol drinker.  Denies illicit and recreational drug use, last used marijuana 43 years ago.  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    I have reviewed her chart and materials related to her cancer extensively and collaborated history with the patient. Summary of oncologic history is as follows: Oncology History   No history exists.    ASSESSMENT & PLAN:  Rectal adenocarcinoma, primary with nodal mets, hepatic mets, bilateral pulmonary mets - Patient with approximately 2-week history of epigastric discomfort, nausea, poor appetite and difficulty with bowel movements.  Patient reports no prior colon surveillance or any prior GI issues. - Ultrasound of abdomen done 10/30/2023 shows cirrhotic morphology of liver with possible solid mass in the caudate lobe 4.5 cm. - CT abdomen pelvis done 10/30/2023 shows bulky rectal mass with extension to the presacral space most consistent with primary rectal adenocarcinoma.  She had extensive mets to nodes, innumerable hepatic mets, and bilateral lower lobe pulmonary nodules consistent with pulmonary mets. - Seen by GI.  Status post endoscopy and colonoscopy done today.  Path  from biopsy is pending. - Recommend CT chest to be done - Medical oncology/Dr. Cherly Hensen following.  Treatment planning and options will be  forthcoming.  Transaminitis Hyperbilirubinemia - Elevated liver enzymes likely secondary to hepatic mets - Total bili very elevated 11.1.  It is noted bili was WNL 09/29/2023. - Continue to trend LFTs and bilirubin  Anemia, normocytic - Hemoglobin 10.6 on admission, stable 10.4 today - Likely due to malignancy - No transfusional intervention at this time - Monitor CBC with differential  Leukocytosis - WBC elevated 14.5 - Afebrile, monitor fever curve  Thrombocytosis - Platelets elevated 4 53K today - Likely reactive  AKI - Unclear etiology - Creatinine increasing, currently 2.2 with elevated BUN 54 - Avoid nephrotoxic agents - Continue to monitor CMP   MEDICAL HISTORY:  Past Medical History:  Diagnosis Date   CHF (congestive heart failure) (HCC)    Chronic systolic heart failure (HCC)    a. NICM b. ECHO (11/2012): EF 15-20%, Diff HK, grade IV DD, mod MR, LA mildly dilated c. RHC (02/2013): RA: 3, RV: 35/7, PA: 32/11 (19), PCWP: 6, PA sat 69%, Fick CO/CI: 4.6 / 2.8 d. ECHO (12/2013): EF 35-40%, diff HK, grade I DD, trivial MR, LA mildly dialted, RV nl   Hypertension, malignant    NICM (nonischemic cardiomyopathy) (HCC)    LHC (02/2013): Lmain: no sig. dz, LAD: luminal irregularities, LCx: luminal irregularities, co-dominant, RCA: luminal irregularities, rel small, co-dominant with LCx   Tobacco abuse     SURGICAL HISTORY: Past Surgical History:  Procedure Laterality Date   TUBAL LIGATION  2000    SOCIAL HISTORY: Social History   Socioeconomic History   Marital status: Single    Spouse name: Not on file   Number of children: Not on file   Years of education: Not on file   Highest education level: Not on file  Occupational History   Not on file  Tobacco Use   Smoking status: Every Day    Current packs/day: 0.50    Types: Cigarettes   Smokeless tobacco: Never  Vaping Use   Vaping status: Never Used  Substance and Sexual Activity   Alcohol use: Not Currently     Comment: very rarely   Drug use: No   Sexual activity: Not Currently  Other Topics Concern   Not on file  Social History Narrative   [place]ives in [place]. Smokes cigarettes 1/2 ppd, social Etoh but not to excess. Works at [employer].    Social Drivers of Corporate investment banker Strain: Not on file  Food Insecurity: No Food Insecurity (10/30/2023)   Hunger Vital Sign    Worried About Running Out of Food in the Last Year: Never true    Ran Out of Food in the Last Year: Never true  Transportation Needs: No Transportation Needs (10/30/2023)   PRAPARE - Administrator, Civil Service (Medical): No    Lack of Transportation (Non-Medical): No  Physical Activity: Not on file  Stress: Not on file  Social Connections: Not on file  Intimate Partner Violence: Not At Risk (10/30/2023)   Humiliation, Afraid, Rape, and Kick questionnaire    Fear of Current or Ex-Partner: No    Emotionally Abused: No    Physically Abused: No    Sexually Abused: No    FAMILY HISTORY: Family History  Problem Relation Age of Onset   Stroke Mother    Heart disease Mother    Heart disease Father  REVIEW OF SYSTEMS:   Constitutional: Denies fevers, chills or abnormal night sweats Eyes: Denies blurriness of vision, double vision or watery eyes Ears, nose, mouth, throat, and face: Denies mucositis or sore throat Respiratory: Denies cough, dyspnea or wheezes Cardiovascular: Denies palpitation, chest discomfort or lower extremity swelling Gastrointestinal: + Epigastric discomfort, nausea, heartburn or change in bowel habits Skin: Denies abnormal skin rashes Lymphatics: Denies new lymphadenopathy or easy bruising Neurological: Denies numbness, tingling or new weaknesses Behavioral/Psych: Mood is stable, no new changes  All other systems were reviewed with the patient and are negative.  PHYSICAL EXAMINATION: ECOG PERFORMANCE STATUS: 1 - Symptomatic but completely  ambulatory  Vitals:   11/02/23 0718 11/02/23 1154  BP: 101/68 104/69  Pulse: 86 88  Resp: 18 17  Temp: 98 F (36.7 C) 98 F (36.7 C)  SpO2: 98% 98%   Filed Weights   10/30/23 1031 10/31/23 0452 11/02/23 0501  Weight: 145 lb (65.8 kg) 140 lb 6.9 oz (63.7 kg) 141 lb 1.6 oz (64 kg)    GENERAL: alert, no distress and comfortable SKIN: skin color, texture, turgor are normal, no rashes or significant lesions EYES: + Scleral icterus  OROPHARYNX: no exudate, no erythema and lips, buccal mucosa, and tongue normal  NECK: supple, thyroid normal size, non-tender, without nodularity LYMPH: no palpable lymphadenopathy in the cervical, axillary or inguinal LUNGS: clear to auscultation and percussion with normal breathing effort HEART: regular rate & rhythm and no murmurs and no lower extremity edema ABDOMEN: abdomen soft, non-tender and normal bowel sounds MUSCULOSKELETAL: no cyanosis of digits and no clubbing  PSYCH: alert & oriented x 3 with fluent speech NEURO: no focal motor/sensory deficits   ALLERGIES:  has no known allergies.  MEDICATIONS:  Current Facility-Administered Medications  Medication Dose Route Frequency Provider Last Rate Last Admin   acetaminophen (TYLENOL) tablet 650 mg  650 mg Oral Q6H PRN Dahal, Melina Schools, MD       Or   acetaminophen (TYLENOL) suppository 650 mg  650 mg Rectal Q6H PRN Dahal, Binaya, MD       albuterol (PROVENTIL) (2.5 MG/3ML) 0.083% nebulizer solution 2.5 mg  2.5 mg Nebulization Q6H PRN Dahal, Binaya, MD       enoxaparin (LOVENOX) injection 30 mg  30 mg Subcutaneous Q24H Esterwood, Amy S, PA-C       feeding supplement (ENSURE ENLIVE / ENSURE PLUS) liquid 237 mL  237 mL Oral BID BM Sreeram, Narendranath, MD   237 mL at 11/02/23 1135   hydrALAZINE (APRESOLINE) injection 10 mg  10 mg Intravenous Q6H PRN Dahal, Melina Schools, MD       HYDROmorphone (DILAUDID) injection 0.5 mg  0.5 mg Intravenous Q4H PRN Dahal, Binaya, MD   0.5 mg at 11/01/23 1643   lactated  ringers infusion   Intravenous Continuous Sreeram, Narendranath, MD 100 mL/hr at 11/02/23 1012 New Bag at 11/02/23 1012   polyethylene glycol (MIRALAX / GLYCOLAX) packet 17 g  17 g Oral Daily PRN Dahal, Melina Schools, MD         LABORATORY DATA:  I have reviewed the data as listed Lab Results  Component Value Date   WBC 14.5 (H) 11/02/2023   HGB 10.4 (L) 11/02/2023   HCT 29.5 (L) 11/02/2023   MCV 87.8 11/02/2023   PLT 453 (H) 11/02/2023   Recent Labs    09/29/23 0838 09/29/23 0838 10/30/23 1041 10/31/23 0258 11/01/23 0330 11/02/23 1012  NA  --    < > 135 135 130* 128*  K  --    < >  4.1 4.3 4.3 5.2*  CL  --    < > 100 99 97* 98  CO2  --    < > 20* 21* 19* 16*  GLUCOSE  --    < > 131* 94 102* 108*  BUN  --    < > 42* 50* 54* 78*  CREATININE  --    < > 1.41* 1.73* 2.20* 3.52*  CALCIUM  --    < > 8.7* 8.3* 8.2* 7.9*  GFRNONAA  --    < > 43* 34* 25* 14*  PROT 7.0  --  6.6 6.3* 6.3*  --   ALBUMIN  --   --  2.7* 2.5* 2.5*  --   AST 33  --  355* 423* 422*  --   ALT 30*  --  232* 268* 293*  --   ALKPHOS  --   --  663* 585* 521*  --   BILITOT 0.4  --  10.9* 10.2* 11.1*  --   BILIDIR 0.1  --  6.7*  --   --   --   IBILI 0.3  --   --   --   --   --    < > = values in this interval not displayed.    RADIOGRAPHIC STUDIES: I have personally reviewed the radiological images as listed and agreed with the findings in the report. ECHOCARDIOGRAM COMPLETE Result Date: 10/31/2023    ECHOCARDIOGRAM REPORT   Patient Name:   AUBRIE LUCIEN Councilman Date of Exam: 10/31/2023 Medical Rec #:  [id number]            Height:       60.0 in Accession #:    [id number]           Weight:       140.4 lb Date of Birth:  September 26, 1965             BSA:          1.606 m Patient Age:    57 years             BP:           103/64 mmHg Patient Gender: F                    HR:           82 bpm. Exam Location:  Inpatient Procedure: 2D Echo, Cardiac Doppler, Color Doppler and Strain Analysis (Both            Spectral and Color Flow  Doppler were utilized during procedure). Indications:    Abnormal ECG 794.31 / R94.31  History:        Patient has prior history of Echocardiogram examinations, most                 recent 11/26/2020. CHF; Risk Factors:Hypertension and Current                 Smoker.  Sonographer:    Lucendia Herrlich RCS Referring Phys: [id number] BINAYA DAHAL IMPRESSIONS  1. Left ventricular ejection fraction, by estimation, is 60 to 65%. The left ventricle has normal function. The left ventricle has no regional wall motion abnormalities. Left ventricular diastolic parameters are indeterminate.  2. Right ventricular systolic function is normal. The right ventricular size is normal.  3. The mitral valve is normal in structure. Trivial mitral valve regurgitation. No evidence of mitral stenosis.  4. Tricuspid valve regurgitation is mild to moderate.  5. The  aortic valve is normal in structure. Aortic valve regurgitation is not visualized. No aortic stenosis is present.  6. The inferior vena cava is normal in size with greater than 50% respiratory variability, suggesting right atrial pressure of 3 mmHg. FINDINGS  Left Ventricle: Left ventricular ejection fraction, by estimation, is 60 to 65%. The left ventricle has normal function. The left ventricle has no regional wall motion abnormalities. Global longitudinal strain performed but not reported based on interpreter judgement due to suboptimal tracking. The left ventricular internal cavity size was normal in size. There is no left ventricular hypertrophy. Left ventricular diastolic parameters are indeterminate. Right Ventricle: The right ventricular size is normal. No increase in right ventricular wall thickness. Right ventricular systolic function is normal. Left Atrium: Left atrial size was normal in size. Right Atrium: Right atrial size was normal in size. Pericardium: There is no evidence of pericardial effusion. Mitral Valve: The mitral valve is normal in structure. Trivial mitral  valve regurgitation. No evidence of mitral valve stenosis. Tricuspid Valve: The tricuspid valve is normal in structure. Tricuspid valve regurgitation is mild to moderate. No evidence of tricuspid stenosis. Aortic Valve: The aortic valve is normal in structure. Aortic valve regurgitation is not visualized. No aortic stenosis is present. Aortic valve peak gradient measures 11.3 mmHg. Pulmonic Valve: The pulmonic valve was normal in structure. Pulmonic valve regurgitation is mild. No evidence of pulmonic stenosis. Aorta: The aortic root is normal in size and structure. Venous: The inferior vena cava is normal in size with greater than 50% respiratory variability, suggesting right atrial pressure of 3 mmHg. IAS/Shunts: No atrial level shunt detected by color flow Doppler.  LEFT VENTRICLE PLAX 2D LVIDd:         4.30 cm   Diastology LVIDs:         2.90 cm   LV e' medial:    6.42 cm/s LV PW:         0.90 cm   LV E/e' medial:  7.9 LV IVS:        0.90 cm   LV e' lateral:   7.94 cm/s LVOT diam:     2.10 cm   LV E/e' lateral: 6.4 LV SV:         62 LV SV Index:   38 LVOT Area:     3.46 cm  RIGHT VENTRICLE             IVC RV S prime:     17.20 cm/s  IVC diam: 1.10 cm TAPSE (M-mode): 2.4 cm LEFT ATRIUM             Index        RIGHT ATRIUM          Index LA diam:        3.90 cm 2.43 cm/m   RA Area:     9.22 cm LA Vol (A2C):   35.9 ml 22.35 ml/m  RA Volume:   15.00 ml 9.34 ml/m LA Vol (A4C):   52.9 ml 32.94 ml/m LA Biplane Vol: 43.4 ml 27.02 ml/m  AORTIC VALVE AV Area (Vmax): 2.21 cm AV Vmax:        168.00 cm/s AV Peak Grad:   11.3 mmHg LVOT Vmax:      107.00 cm/s LVOT Vmean:     70.600 cm/s LVOT VTI:       0.178 m  AORTA Ao Root diam: 3.40 cm Ao Asc diam:  3.40 cm MITRAL VALVE  TRICUSPID VALVE MV Area (PHT): 4.31 cm     TR Peak grad:   30.9 mmHg MV Decel Time: 176 msec     TR Vmax:        278.00 cm/s MR Peak grad: 29.4 mmHg MR Vmax:      271.00 cm/s   SHUNTS MV E velocity: 50.60 cm/s   Systemic VTI:  0.18 m  MV A velocity: 100.00 cm/s  Systemic Diam: 2.10 cm MV E/A ratio:  0.51 Kardie Tobb DO Electronically signed by Thomasene Ripple DO Signature Date/Time: 10/31/2023/3:26:54 PM    Final    CT ABDOMEN PELVIS W CONTRAST Result Date: 10/30/2023 CLINICAL DATA:  Abdominal pain and bloating.  Abnormal ultrasound EXAM: CT ABDOMEN AND PELVIS WITH CONTRAST TECHNIQUE: Multidetector CT imaging of the abdomen and pelvis was performed using the standard protocol following bolus administration of intravenous contrast. RADIATION DOSE REDUCTION: This exam was performed according to the departmental dose-optimization program which includes automated exposure control, adjustment of the mA and/or kV according to patient size and/or use of iterative reconstruction technique. CONTRAST:  75mL OMNIPAQUE IOHEXOL 350 MG/ML SOLN COMPARISON:  Ultrasound same day FINDINGS: Lower chest: Bilateral round lower lobe pulmonary nodules. Nodules of varying size. Nodule in RIGHT lower lobe medially along the pleural surface measures 14 mm (image 12). Nodule LEFT lower lobe measures 5 mm on image 7. Proximally 3-6 nodules in each lung base. Hepatobiliary: Innumerable hypoenhancing lesions within LEFT and RIGHT hepatic lobe. The liver is expanded by this near confluent pattern. Largest lesion in the RIGHT hepatic lobe measures 6.5 cm (image 14/3). Small example lesion in the LEFT hepatic lobe measures 2 cm image 18/3. These lesions are too numerous to count range in size from 10 mm to 65 mm. Gallbladder collapsed Pancreas: Pancreas is normal. No ductal dilatation. No pancreatic inflammation. Spleen: Normal spleen Adrenals/urinary tract: Adrenal glands and kidneys are normal. The ureters and bladder normal. Stomach/Bowel: Stomach, duodenum small-bowel normal. Appendix not identified. Ascending and transverse colon normal. Descending colon normal. There is asymmetric thickening in the mid and distal rectum along the LEFT wall of the rectum measuring 3.2 cm  (image 64/3). Bulky endoluminal rectal mass is well seen on coronal imaging measuring 4.8 cm (image 26/6). enlarged lymph nodes in the LEFT mesorectum measuring 10-12 mm on image 64/3. Exophytic extension from the rectum to the presacral space measuring 3.7 cm on image 60/3 Vascular/Lymphatic: No upper abdominal adenopathy. Axial adenopathy described above. Reproductive: Post hysterectomy anatomy. There is irregularity in the upper aspect of the vagina/vaginal cuff (image 63/3) Other: No free fluid. Musculoskeletal: No aggressive osseous lesion. IMPRESSION: 1. Bulky rectal mass with extension to the presacral space most consistent with primary rectal adenocarcinoma. No evidence of bowel obstruction. 2. Local nodal metastasis to the mesorectum. 3. Potential local extension to upper vagina.  Post hysterectomy. 4. Innumerable HEPATIC METASTASIS. 5. Bilateral lower lobe pulmonary nodules consistent with PULMONARY METASTASIS. Findings conveyed toKommer, MD on 10/30/2023  at14:03. Electronically Signed   By: Genevive Bi M.D.   On: 10/30/2023 14:03   US Abdomen Limited RUQ (LIVER/GB) Result Date: 10/30/2023 CLINICAL DATA:  [id number] Transaminitis [id number] EXAM: ULTRASOUND ABDOMEN LIMITED RIGHT UPPER QUADRANT COMPARISON:  None Available. FINDINGS: Gallbladder: Partially contracted. No gallstones or wall thickening visualized. No sonographic Murphy sign noted by sonographer. Common bile duct: Diameter: 6 mm Liver: Diffusely heterogeneous appearance of the liver with coarsened echotexture and generally increased parenchymal echogenicity. Possible solid mass in the caudate lobe measuring 4.5 cm. Nodular hepatic surface contour.  Portal vein is patent on color Doppler imaging with normal direction of blood flow towards the liver. Other: None. IMPRESSION: 1. Cirrhotic morphology of the liver with possible solid mass in the caudate lobe measuring 4.5 cm. Further evaluation with contrast-enhanced MRI of the abdomen is  recommended. 2. No evidence of cholelithiasis or acute cholecystitis. Electronically Signed   By: Duanne Guess D.O.   On: 10/30/2023 13:16   DG Chest 2 View Result Date: 10/30/2023 CLINICAL DATA:  Shortness of breath EXAM: CHEST - 2 VIEW COMPARISON:  09/03/2015 FINDINGS: The heart size and mediastinal contours are within normal limits. No focal airspace consolidation, pleural effusion, or pneumothorax. The visualized skeletal structures are unremarkable. IMPRESSION: No active cardiopulmonary disease. Electronically Signed   By: Duanne Guess D.O.   On: 10/30/2023 12:00   DG Abd 1 View Result Date: 10/26/2023 CLINICAL DATA:  Abdominal bloating. EXAM: ABDOMEN - 1 VIEW COMPARISON:  Radiograph dated 09/09/2009. FINDINGS: Mild colonic stool burden. No bowel dilatation or evidence of obstruction. No free air. The osseous structures are intact. The soft tissues are unremarkable IMPRESSION: Nonobstructive bowel gas pattern. Electronically Signed   By: Elgie Collard M.D.   On: 10/26/2023 15:36   US THYROID Result Date: 10/13/2023 CLINICAL DATA:  Hyperthyroid. EXAM: THYROID ULTRASOUND TECHNIQUE: Ultrasound examination of the thyroid gland and adjacent soft tissues was performed. COMPARISON:  None Available. FINDINGS: Parenchymal Echotexture: Moderately heterogeneous Isthmus: 0.5 cm Right lobe: 4.2 x 1.6 x 1.8 cm Left lobe: 5.0 x 3.0 x 2.3 cm ________________________________________________________ Estimated total number of nodules >/= 1 cm: 4 Number of spongiform nodules >/=  2 cm not described below (TR1): 0 Number of mixed cystic and solid nodules >/= 1.5 cm not described below (TR2): 0 _________________________________________________________ Nodule # 1: Location: Isthmus; Superior Maximum size: 0.6 cm; Other 2 dimensions: 0.4 x 0.5 cm Composition: spongiform (0) Echogenicity: hypoechoic (2) Shape: not taller-than-wide (0) Margins: smooth (0) Echogenic foci: none (0) ACR TI-RADS total points: 2. ACR  TI-RADS risk category: TR2 (2 points). ACR TI-RADS recommendations: This nodule does NOT meet TI-RADS criteria for biopsy or dedicated follow-up. _________________________________________________________ Nodule # 2: Location: Right; Superior Maximum size: 1.1 cm; Other 2 dimensions: 1.1 x 0.9 cm Composition: mixed cystic and solid (1) Echogenicity: isoechoic (1) Shape: not taller-than-wide (0) Margins: smooth (0) Echogenic foci: macrocalcifications (1) ACR TI-RADS total points: 3. ACR TI-RADS risk category: TR3 (3 points). ACR TI-RADS recommendations: Given size (<1.4 cm) and appearance, this nodule does NOT meet TI-RADS criteria for biopsy or dedicated follow-up. _________________________________________________________ Nodule # 3: Location: Right; Mid Maximum size: 0.7 cm; Other 2 dimensions: 0.6 x 0.6 cm Composition: mixed cystic and solid (1) Echogenicity: isoechoic (1) Shape: not taller-than-wide (0) Margins: smooth (0) Echogenic foci: none (0) ACR TI-RADS total points: 2. ACR TI-RADS risk category: TR2 (2 points). ACR TI-RADS recommendations: This nodule does NOT meet TI-RADS criteria for biopsy or dedicated follow-up. _________________________________________________________ Nodule # 4: Location: Right; Inferior Maximum size: 1.0 cm; Other 2 dimensions: 1.0 x 0.7 cm Composition: mixed cystic and solid (1) Echogenicity: isoechoic (1) Shape: not taller-than-wide (0) Margins: smooth (0) Echogenic foci: none (0) ACR TI-RADS total points: 2. ACR TI-RADS risk category: TR2 (2 points). ACR TI-RADS recommendations: This nodule does NOT meet TI-RADS criteria for biopsy or dedicated follow-up. _________________________________________________________ Nodule # 5: Location: Left; Superior Maximum size: 2.0 cm; Other 2 dimensions: 1.8 x 1.4 cm Composition: mixed cystic and solid (1) Echogenicity: isoechoic (1) Shape: not taller-than-wide (0) Margins: smooth (0) Echogenic foci: none (0)  ACR TI-RADS total points: 2. ACR  TI-RADS risk category: TR2 (2 points). ACR TI-RADS recommendations: This nodule does NOT meet TI-RADS criteria for biopsy or dedicated follow-up. _________________________________________________________ Nodule # 6: Location: Left; Mid Maximum size: 0.8 cm; Other 2 dimensions: 0.8 x 0.6 cm Composition: spongiform (0) Echogenicity: hypoechoic (2) Shape: not taller-than-wide (0) Margins: smooth (0) Echogenic foci: none (0) ACR TI-RADS total points: 2. ACR TI-RADS risk category: TR2 (2 points). ACR TI-RADS recommendations: This nodule does NOT meet TI-RADS criteria for biopsy or dedicated follow-up. _________________________________________________________ Pseudo nodularity about the left inferior thyroid without discrete margins. No cervical lymphadenopathy. IMPRESSION: 1. Multinodular thyroid. 2. Each of the visualized bilateral thyroid nodules appears benign and does not warrant additional follow-up. The above is in keeping with the ACR TI-RADS recommendations - J Am Coll Radiol 2017;14:587-595. Marliss Coots, MD [facility] Radiology Electronically Signed   By: Marliss Coots M.D.   On: 10/13/2023 11:06     The total time spent in the appointment was 55 minutes encounter with patients including review of chart and various tests results, discussions about plan of care and coordination of care plan   All questions were answered. The patient knows to call the clinic with any problems, questions or concerns. No barriers to learning was detected.  Dawson Bills, NP 3/18/20251:12 PM

## 2023-11-02 NOTE — Progress Notes (Addendum)
 Progress Note   Patient: Kim Underwood ZOX:[id number] DOB: 09-03-1965 DOA: 10/30/2023     2 DOS: the patient was seen and examined on 11/02/2023   Brief hospital course: Kim Underwood is a 58 y.o. female with PMH significant for hypertension, chronic systolic CHF, NICM with improved EF presented for evaluation of abdominal bloating, fullness. CT abdomen/ pelvis showed- Bulky rectal mass with extension to the presacral space most consistent with primary rectal adenocarcinoma. She is admitted to [facility] for further management.  Assessment and Plan: Rectal mass Suspicion for metastatic rectal adenocarcinoma. GI evaluated her, she had colonoscopy today malignant tumor biopsied. Multiple polyps removed. Await pathology. Oncology consulted, she wishes to get treatment, advised CT chest, liver biopsy. Plan for Liver biopsy once kidney function better.  Advance diet as tolerated.  Elevated liver enzymes: Possibly secondary to metastasis. Liver cirrhosis noted on ultrasound. Acute hepatitis panel nonreactive. Bili elevated to 11. Continue to trend LFTs. Plan to get liver biopsy once kidney function better.  Acute kidney injury: Possibly in the setting of poor oral intake, recent contrast CT study. Started her on IV hydration. Creatinine bumped to 3.52 US renal ordered. Nephrology consult placed. Continue to monitor daily renal function. Avoid nephrotoxic drugs.  Hyponatremia Due to poor oral intake. Will continue to trend. Encourage oral diet, supplements.  Hyperkalemia- Due to renal failure. Will give lokelma one time. Continue to monitor daily electrolytes.  Leukocytosis Thrombocytosis- Possible reactive, Stable. No source of infection. Trend cbc.  History of systolic CHF Nonischemic cardiomyopathy Last echocardiogram 2022 with EF recovered to 50%. Held Coreg, Imdur, Entresto and Aldactone due to low BP. Echo showed EF 60-65%. She is on dry side, no exacerbation of  CHF.  Hypertension: Blood pressure lower side. Caution with GDMT.  Poor oral intake-  Dietician evaluation. Protein supplements ordered.  Out of bed to chair. Incentive spirometry. Nursing supportive care. Fall, aspiration precautions. Diet:  Diet Orders (From admission, onward)     Start     Ordered   11/02/23 1422  Diet regular Room service appropriate? Yes; Fluid consistency: Thin  Diet effective now       Question Answer Comment  Room service appropriate? Yes   Fluid consistency: Thin      11/02/23 1421           DVT prophylaxis: enoxaparin (LOVENOX) injection 30 mg Start: 11/02/23 2200  Level of care: tele med   Code Status: Full Code  Subjective: Patient is seen and examined today morning. She is sitting in bed. Significant other at bedside. Has liquid stools. Feels weak, eating poor.  Physical Exam: Vitals:   11/02/23 0501 11/02/23 0718 11/02/23 1154 11/02/23 1447  BP: 106/76 101/68 104/69 103/67  Pulse: 87 86 88 91  Resp: 19 18 17  (!) 21  Temp: 98.1 F (36.7 C) 98 F (36.7 C) 98 F (36.7 C) 97.9 F (36.6 C)  TempSrc: Oral Oral Oral Oral  SpO2: 97% 98% 98% 100%  Weight: 64 kg     Height:        General - Middle aged [place] American female, no apparent distress HEENT - PERRLA, EOMI, atraumatic head, non tender sinuses. Lung - Clear, diffuse rales, rhonchi, no wheezes. Heart - S1, S2 heard, no murmurs, rubs, no pedal edema. Abdomen - Soft, non tender, distended, bowel sounds good Neuro - Alert, awake and oriented x 3, non focal exam. Skin - Warm and dry.  Data Reviewed:      Latest Ref Rng & Units 11/02/2023  10:12 AM 11/01/2023    3:30 AM 10/31/2023    2:58 AM  CBC  WBC 4.0 - 10.5 K/uL 14.5  14.9  14.4   Hemoglobin 12.0 - 15.0 g/dL 19.1  47.8  9.9   Hematocrit 36.0 - 46.0 % 29.5  28.0  28.3   Platelets 150 - 400 K/uL 453  454  469       Latest Ref Rng & Units 11/02/2023   10:12 AM 11/01/2023    3:30 AM 10/31/2023    2:58 AM  BMP   Glucose 70 - 99 mg/dL 295  621  94   BUN 6 - 20 mg/dL 78  54  50   Creatinine 0.44 - 1.00 mg/dL 3.08  6.57  8.46   Sodium 135 - 145 mmol/L 128  130  135   Potassium 3.5 - 5.1 mmol/L 5.2  4.3  4.3   Chloride 98 - 111 mmol/L 98  97  99   CO2 22 - 32 mmol/L 16  19  21    Calcium 8.9 - 10.3 mg/dL 7.9  8.2  8.3    No results found.   Family Communication: Discussed with patient,  sig other. They understand and agree. All questions answered.  Disposition: Status is: changed to Inpatient Remains inpatient appropriate because: colonoscopy for tissue.  Planned Discharge Destination: Home     Time spent: 38 minutes  Author: Marcelino Duster, MD 11/02/2023 3:31 PM Secure chat 7am to 7pm For on call review [email or web address].

## 2023-11-03 ENCOUNTER — Inpatient Hospital Stay (HOSPITAL_COMMUNITY)

## 2023-11-03 DIAGNOSIS — K6289 Other specified diseases of anus and rectum: Secondary | ICD-10-CM | POA: Diagnosis not present

## 2023-11-03 LAB — BASIC METABOLIC PANEL
Anion gap: 15 (ref 5–15)
BUN: 81 mg/dL — ABNORMAL HIGH (ref 6–20)
CO2: 19 mmol/L — ABNORMAL LOW (ref 22–32)
Calcium: 8.3 mg/dL — ABNORMAL LOW (ref 8.9–10.3)
Chloride: 97 mmol/L — ABNORMAL LOW (ref 98–111)
Creatinine, Ser: 3.44 mg/dL — ABNORMAL HIGH (ref 0.44–1.00)
GFR, Estimated: 15 mL/min — ABNORMAL LOW (ref >60)
Glucose, Bld: 100 mg/dL — ABNORMAL HIGH (ref 70–99)
Potassium: 5.1 mmol/L (ref 3.5–5.1)
Sodium: 131 mmol/L — ABNORMAL LOW (ref 135–145)

## 2023-11-03 LAB — CBC
HCT: 27.7 % — ABNORMAL LOW (ref 36.0–46.0)
Hemoglobin: 9.9 g/dL — ABNORMAL LOW (ref 12.0–15.0)
MCH: 30.7 pg (ref 26.0–34.0)
MCHC: 35.7 g/dL (ref 30.0–36.0)
MCV: 86 fL (ref 80.0–100.0)
Platelets: 452 10*3/uL — ABNORMAL HIGH (ref 150–400)
RBC: 3.22 MIL/uL — ABNORMAL LOW (ref 3.87–5.11)
RDW: 18.7 % — ABNORMAL HIGH (ref 11.5–15.5)
WBC: 15.6 10*3/uL — ABNORMAL HIGH (ref 4.0–10.5)
nRBC: 0 % (ref 0.0–0.2)

## 2023-11-03 LAB — HEPATIC FUNCTION PANEL
ALT: 505 U/L — ABNORMAL HIGH (ref 0–44)
AST: 926 U/L — ABNORMAL HIGH (ref 15–41)
Albumin: 2.5 g/dL — ABNORMAL LOW (ref 3.5–5.0)
Alkaline Phosphatase: 510 U/L — ABNORMAL HIGH (ref 38–126)
Bilirubin, Direct: 8.8 mg/dL — ABNORMAL HIGH (ref 0.0–0.2)
Indirect Bilirubin: 5.9 mg/dL — ABNORMAL HIGH (ref 0.3–0.9)
Total Bilirubin: 14.7 mg/dL — ABNORMAL HIGH (ref 0.0–1.2)
Total Protein: 6.2 g/dL — ABNORMAL LOW (ref 6.5–8.1)

## 2023-11-03 LAB — LACTATE DEHYDROGENASE: LDH: 1671 U/L — ABNORMAL HIGH (ref 98–192)

## 2023-11-03 LAB — PROTIME-INR
INR: 1.5 — ABNORMAL HIGH (ref 0.8–1.2)
Prothrombin Time: 18.2 s — ABNORMAL HIGH (ref 11.4–15.2)

## 2023-11-03 LAB — SURGICAL PATHOLOGY

## 2023-11-03 LAB — NA AND K (SODIUM & POTASSIUM), RAND UR
Potassium Urine: 43 mmol/L
Sodium, Ur: 26 mmol/L

## 2023-11-03 LAB — CREATININE, URINE, RANDOM: Creatinine, Urine: 200 mg/dL

## 2023-11-03 MED ORDER — MIDAZOLAM HCL 2 MG/2ML IJ SOLN
INTRAMUSCULAR | Status: AC | PRN
  Administered 2023-11-03: .5 mg via INTRAVENOUS

## 2023-11-03 MED ORDER — LACTATED RINGERS IV SOLN: INTRAVENOUS | Status: DC

## 2023-11-03 MED ORDER — MELATONIN 3 MG PO TABS
3.0000 mg | ORAL_TABLET | Freq: Once | ORAL | Status: AC | PRN
  Administered 2023-11-03: 3 mg via ORAL
  Filled 2023-11-03: qty 1

## 2023-11-03 MED ORDER — FENTANYL CITRATE (PF) 100 MCG/2ML IJ SOLN
INTRAMUSCULAR | Status: AC
  Filled 2023-11-03: qty 2

## 2023-11-03 MED ORDER — GELATIN ABSORBABLE 12-7 MM EX MISC
1.0000 | Freq: Once | CUTANEOUS | Status: AC
  Administered 2023-11-03: 1 via TOPICAL
  Filled 2023-11-03: qty 1

## 2023-11-03 MED ORDER — FENTANYL CITRATE (PF) 100 MCG/2ML IJ SOLN
INTRAMUSCULAR | Status: AC | PRN
Start: 2023-11-03 — End: 2023-11-03
  Administered 2023-11-03 (×3): 12.5 ug via INTRAVENOUS

## 2023-11-03 MED ORDER — LIDOCAINE HCL (PF) 1 % IJ SOLN
10.0000 mL | Freq: Once | INTRAMUSCULAR | Status: AC
  Administered 2023-11-03: 10 mL via INTRADERMAL

## 2023-11-03 MED ORDER — SODIUM ZIRCONIUM CYCLOSILICATE 10 G PO PACK
10.0000 g | PACK | Freq: Once | ORAL | Status: AC
Start: 2023-11-03 — End: 2023-11-03
  Administered 2023-11-03: 10 g via ORAL
  Filled 2023-11-03: qty 1

## 2023-11-03 MED ORDER — HYDROMORPHONE HCL 1 MG/ML IJ SOLN
INTRAMUSCULAR | Status: AC
  Filled 2023-11-03: qty 1

## 2023-11-03 MED ORDER — MIDAZOLAM HCL 2 MG/2ML IJ SOLN
INTRAMUSCULAR | Status: AC
  Filled 2023-11-03: qty 2

## 2023-11-03 NOTE — Progress Notes (Signed)
 PROGRESS NOTE    Kim Underwood  NFA:[id number] DOB: 25-Dec-1965 DOA: 10/30/2023 PCP: Patient, No Pcp Per   Brief Narrative:  58 y.o. female with PMH significant for hypertension, chronic systolic CHF/nonischemic cardiomyopathy currently with improved EF presented for evaluation of abdominal bloating, fullness. CT abdomen/ pelvis showed bulky rectal mass with extension to the presacral space most consistent with primary rectal adenocarcinoma.  GI was consulted.  She underwent colonoscopy on 11/02/2023 which showed possible malignant tumor in the rectum which was biopsied.  Oncology was consulted.  Nephrology also consulted for AKI.  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  Underwent colonoscopy on 11/02/2023.  Pathology pending. -Oncology evaluation appreciated.  IR has been consulted for possible liver biopsy.  Elevated liver enzymes including elevated bilirubin Possible cirrhosis of liver -Possible cirrhosis of liver noted ultrasound.  LFTs continue to trend upwards.  Monitor. -Outpatient follow-up with GI. -Acute hepatitis panel negative.  Liver biopsy pending.  Acute kidney injury Acute metabolic acidosis -Possibly secondary to contrast nephropathy in the context of relative hypotension -Renal ultrasound negative for hydronephrosis. -Nephrology following.  Creatinine 3.52 on 11/02/2023.  Improving with 2.44 today.  Monitor.  History of chronic systolic CHF/nonischemic cardiomyopathy Hypotension History of hypertension -Last echo in 2022 had shown that EF had recovered to 50%. -Echo during this hospitalization showed EF of 60 to 65%.  Currently compensated.  Coreg, Imdur, Entresto and Aldactone on hold due to hypotension.  Blood pressure still on the lower side  Leukocytosis -Worsening.  Monitor  Anemia of chronic  disease -From chronic illnesses and malignancy.  Hemoglobin stable.  Monitor intermittently  Thrombocytosis -Possibly reactive.  Monitor intermittently  Hyperkalemia -Mild.  Resolved  Hyponatremia -Mild.  Monitor.  Encourage oral intake  Severe malnutrition -Follow nutrition recommendations  DVT prophylaxis: Lovenox Code Status: Full Family Communication: Boyfriend at bedside Disposition Plan: Status is: Inpatient Remains inpatient appropriate because: Of severity of illness    Consultants: GI/oncology/nephrology  Procedures: As above  Antimicrobials: None   Subjective: Patient seen and examined at bedside.  Still feels weak with poor oral intake and feels bloated.  Denies worsening abdominal pain, fever or vomiting.  Objective: Vitals:   11/02/23 1447 11/02/23 1935 11/03/23 0022 11/03/23 0512  BP: 103/67 104/65 99/64 101/71  Pulse: 91 86 90 88  Resp: (!) 21 20 20 19   Temp: 97.9 F (36.6 C) 98.2 F (36.8 C) 98.4 F (36.9 C) 98.1 F (36.7 C)  TempSrc: Oral Oral Oral Oral  SpO2: 100% 97% 100% 96%  Weight:      Height:        Intake/Output Summary (Last 24 hours) at 11/03/2023 0723 Last data filed at 11/03/2023 0500 Gross per 24 hour  Intake 1064.84 ml  Output 285 ml  Net 779.84 ml   Filed Weights   10/30/23 1031 10/31/23 0452 11/02/23 0501  Weight: 65.8 kg 63.7 kg 64 kg    Examination:  General exam: Appears calm and comfortable.  Looks chronically ill and deconditioned. Respiratory system: Bilateral decreased breath sounds at bases Cardiovascular system: S1 & S2 heard, Rate controlled Gastrointestinal system: Abdomen is distended, soft and nontender. Normal bowel sounds heard. Extremities: No cyanosis, clubbing, edema  Central nervous system: Alert and oriented. No focal neurological deficits. Moving extremities Skin: No rashes, lesions or ulcers Psychiatry: Flat affect.  Not agitated.    Data Reviewed: I  have personally reviewed following labs  and imaging studies  CBC: Recent Labs  Lab 10/30/23 1041 10/31/23 0258 11/01/23 0330 11/02/23 1012 11/03/23 0228  WBC 15.2* 14.4* 14.9* 14.5* 15.6*  NEUTROABS 11.8*  --  11.3*  --   --   HGB 10.6* 9.9* 10.0* 10.4* 9.9*  HCT 30.8* 28.3* 28.0* 29.5* 27.7*  MCV 88.3 87.1 85.9 87.8 86.0  PLT 536* 469* 454* 453* 452*   Basic Metabolic Panel: Recent Labs  Lab 10/30/23 1041 10/31/23 0258 11/01/23 0330 11/02/23 1012 11/03/23 0228  NA 135 135 130* 128* 131*  K 4.1 4.3 4.3 5.2* 5.1  CL 100 99 97* 98 97*  CO2 20* 21* 19* 16* 19*  GLUCOSE 131* 94 102* 108* 100*  BUN 42* 50* 54* 78* 81*  CREATININE 1.41* 1.73* 2.20* 3.52* 3.44*  CALCIUM 8.7* 8.3* 8.2* 7.9* 8.3*   GFR: Estimated Creatinine Clearance: 14.7 mL/min (A) (by C-G formula based on SCr of 3.44 mg/dL (H)). Liver Function Tests: Recent Labs  Lab 10/30/23 1041 10/31/23 0258 11/01/23 0330 11/03/23 0228  AST 355* 423* 422* 926*  ALT 232* 268* 293* 505*  ALKPHOS 663* 585* 521* 510*  BILITOT 10.9* 10.2* 11.1* 14.7*  PROT 6.6 6.3* 6.3* 6.2*  ALBUMIN 2.7* 2.5* 2.5* 2.5*   Recent Labs  Lab 10/30/23 1041  LIPASE 109*   No results for input(s): "AMMONIA" in the last 168 hours. Coagulation Profile: Recent Labs  Lab 10/31/23 0258 10/31/23 1028  INR 1.3* 1.3*   Cardiac Enzymes: No results for input(s): "CKTOTAL", "CKMB", "CKMBINDEX", "TROPONINI" in the last 168 hours. BNP (last 3 results) No results for input(s): "PROBNP" in the last 8760 hours. HbA1C: No results for input(s): "HGBA1C" in the last 72 hours. CBG: No results for input(s): "GLUCAP" in the last 168 hours. Lipid Profile: No results for input(s): "CHOL", "HDL", "LDLCALC", "TRIG", "CHOLHDL", "LDLDIRECT" in the last 72 hours. Thyroid Function Tests: No results for input(s): "TSH", "T4TOTAL", "FREET4", "T3FREE", "THYROIDAB" in the last 72 hours. Anemia Panel: No results for input(s): "VITAMINB12", "FOLATE", "FERRITIN", "TIBC", "IRON", "RETICCTPCT" in  the last 72 hours. Sepsis Labs: No results for input(s): "PROCALCITON", "LATICACIDVEN" in the last 168 hours.  No results found for this or any previous visit (from the past 240 hours).       Radiology Studies: US RENAL Result Date: 11/03/2023 CLINICAL DATA:  Metastatic colorectal cancer discovered on CT abdomen and pelvis 10/30/2023, presents with acute kidney injury. [id number]. EXAM: RENAL / URINARY TRACT ULTRASOUND COMPLETE COMPARISON:  CT with IV contrast 10/30/2023. FINDINGS: Right Kidney: Renal measurements: 12.5 x 4.3 x 5.8 cm = volume: 164.86 mL. Echogenicity within normal limits. No mass, stones or hydronephrosis visualized. Left Kidney: Renal measurements: 11.6 x 5.6 x 3.8 cm = volume: 128.80 mL. Echogenicity within normal limits. No mass, stones or hydronephrosis visualized. Bladder: Contracted and not well seen. Other: Heterogeneous liver with diffuse metastases. Minimal pelvic ascites. IMPRESSION: 1. No evidence of hydronephrosis. Sonographically unremarkable kidneys. 2. Heterogeneous liver with diffuse metastases. 3. Minimal pelvic ascites. Electronically Signed   By: Almira Bar M.D.   On: 11/03/2023 01:43   CT CHEST WO CONTRAST Result Date: 11/02/2023 CLINICAL DATA:  Evaluate chest for metastases.  Rectal mass. EXAM: CT CHEST WITHOUT CONTRAST TECHNIQUE: Multidetector CT imaging of the chest was performed following the standard protocol without IV contrast. RADIATION DOSE REDUCTION: This exam was performed according to the departmental dose-optimization program which includes automated exposure control, adjustment of the mA and/or kV according to patient size and/or  use of iterative reconstruction technique. COMPARISON:  None Available. FINDINGS: Cardiovascular: Heart is normal size. Aorta is normal caliber. Diffuse coronary artery calcifications throughout the left coronary arteries and scattered calcifications throughout the right coronary artery. Scattered aortic atherosclerosis.  Mediastinum/Nodes: No mediastinal, hilar, or axillary adenopathy. Trachea and esophagus are unremarkable. Thyroid unremarkable. Lungs/Pleura: Numerous bilateral pulmonary nodules compatible with metastases. The largest on the right is in the posteromedial right lower lobe on image 92 measuring 1.5 cm. Largest on the left is in the left lower lobe on image 86 measuring 1.1 cm. No effusions. Upper Abdomen: Heterogeneous appearance of the liver compatible with metastases as seen on prior abdominal CT. Musculoskeletal: Chest wall soft tissues are unremarkable. No acute bony abnormality or focal bone lesion. IMPRESSION: Numerous bilateral pulmonary nodules with largest nodule measured above. Findings compatible with metastases. Innumerable hepatic metastases better seen on prior abdominal CT. Extensive coronary artery disease. Aortic Atherosclerosis (ICD10-I70.0). Electronically Signed   By: Charlett Nose M.D.   On: 11/02/2023 21:39        Scheduled Meds:  dronabinol  5 mg Oral BID AC   enoxaparin (LOVENOX) injection  30 mg Subcutaneous Q24H   feeding supplement  237 mL Oral BID BM   multivitamin with minerals  1 tablet Oral Daily   Continuous Infusions:        Glade Lloyd, MD [facility] 11/03/2023, 7:23 AM

## 2023-11-03 NOTE — Plan of Care (Signed)
   Problem: Clinical Measurements: Goal: Cardiovascular complication will be avoided Outcome: Progressing   Problem: Activity: Goal: Risk for activity intolerance will decrease Outcome: Progressing

## 2023-11-03 NOTE — Consult Note (Signed)
 Chief Complaint: Liver lesion; request for image guided liver biopsy  Referring Provider(s): Dr. Geannie Risen  Supervising Physician: Yesterday  Patient Status: [facility]-IP  History of Present Illness: Kim Underwood is a 58 y.o. female with PMH of HTN, CHF, nonischemic cardiomyopathy (improved EF) who presented to ED on 3/15 with abd fullness and bloating x 7-10 days. CTA revealed bulky rectal mass consistent with primary rectal adenocarcinoma, nodal mets and innumerable hepatic mets. Also showed bilateral lower lobe pulmonary mets. US showed cirrhotic liver and possible solid mass. Patient was admitted and IR consulted for image guided liver lesion biopsy. Oncology and nephrology also consulted.  At time of interview, pt is sitting up in bed, NAD, appears fatigued. See ROS below. Afebrile. Significant other is also at bedside for interview and agrees with plan along with patient to move forward with bx.   Patient is Full Code   Past Medical History:  Diagnosis Date   CHF (congestive heart failure) (HCC)    Chronic systolic heart failure (HCC)    a. NICM b. ECHO (11/2012): EF 15-20%, Diff HK, grade IV DD, mod MR, LA mildly dilated c. RHC (02/2013): RA: 3, RV: 35/7, PA: 32/11 (19), PCWP: 6, PA sat 69%, Fick CO/CI: 4.6 / 2.8 d. ECHO (12/2013): EF 35-40%, diff HK, grade I DD, trivial MR, LA mildly dialted, RV nl   Hypertension, malignant    NICM (nonischemic cardiomyopathy) (HCC)    LHC (02/2013): Lmain: no sig. dz, LAD: luminal irregularities, LCx: luminal irregularities, co-dominant, RCA: luminal irregularities, rel small, co-dominant with LCx   Tobacco abuse     Past Surgical History:  Procedure Laterality Date   TUBAL LIGATION  2000    Allergies: Patient has no known allergies.  Medications: Prior to Admission medications   Medication Sig Start Date End Date Taking? Authorizing Provider  acetaminophen (TYLENOL) 325 MG tablet Take 650 mg by mouth every 4 (four) hours as needed  for moderate pain.   Yes [provider]  carvedilol (COREG) 6.25 MG tablet TAKE 1 TABLET BY MOUTH TWICE DAILY WITH MEALS . APPOINTMENT REQUIRED FOR FUTURE REFILLS 09/01/23  Yes Laurey Morale, MD  cetirizine (ZYRTEC ALLERGY) 10 MG tablet Take 1 tablet (10 mg total) by mouth daily. Patient taking differently: Take 10 mg by mouth daily as needed for allergies. 08/07/22  Yes Wallis Bamberg, PA-C  hydrALAZINE (APRESOLINE) 50 MG tablet Take 1 tablet (50 mg total) by mouth 2 (two) times daily. NEEDS FOLLOW UP APPOINTMENT FOR MORE REFILLS 05/20/23  Yes Laurey Morale, MD  isosorbide mononitrate (IMDUR) 30 MG 24 hr tablet Take 1 tablet (30 mg total) by mouth daily. NEEDS FOLLOW UP APPOINTMENT FOR MORE REFILLS 05/20/23  Yes Laurey Morale, MD  sacubitril-valsartan (ENTRESTO) 49-51 MG Take 1 tablet by mouth 2 (two) times daily. 10/05/22  Yes Laurey Morale, MD  spironolactone (ALDACTONE) 25 MG tablet Take 1 tablet by mouth once daily 02/25/23  Yes Laurey Morale, MD  propylthiouracil (PTU) 50 MG tablet Take 1 tablet (50 mg total) by mouth daily in the afternoon. Patient not taking: Reported on 10/31/2023 10/19/23   Shamleffer, Konrad Dolores, MD     Family History  Problem Relation Age of Onset   Stroke Mother    Heart disease Mother    Heart disease Father     Social History   Socioeconomic History   Marital status: Single    Spouse name: Not on file   Number of children: Not on  file   Years of education: Not on file   Highest education level: Not on file  Occupational History   Not on file  Tobacco Use   Smoking status: Every Day    Current packs/day: 0.50    Types: Cigarettes   Smokeless tobacco: Never  Vaping Use   Vaping status: Never Used  Substance and Sexual Activity   Alcohol use: Not Currently    Comment: very rarely   Drug use: No   Sexual activity: Not Currently  Other Topics Concern   Not on file  Social History Narrative   Lives in [place]. Smokes  cigarettes 1/2 ppd, social Etoh but not to excess. Works at [employer].    Social Drivers of Corporate investment banker Strain: Not on file  Food Insecurity: No Food Insecurity (10/30/2023)   Hunger Vital Sign    Worried About Running Out of Food in the Last Year: Never true    Ran Out of Food in the Last Year: Never true  Transportation Needs: No Transportation Needs (10/30/2023)   PRAPARE - Administrator, Civil Service (Medical): No    Lack of Transportation (Non-Medical): No  Physical Activity: Not on file  Stress: Not on file  Social Connections: Not on file       Review of Systems  Constitutional:  Positive for fatigue. Negative for chills and fever.  Respiratory:  Positive for shortness of breath. Negative for chest tightness.        Pt associates SHOB with abd distention  Cardiovascular:  Negative for chest pain and leg swelling.  Gastrointestinal:  Positive for abdominal distention and nausea. Negative for diarrhea and vomiting.       Streaks of bright red blood with wiping this AM, colonoscopy yesterday  Hematological:  Does not bruise/bleed easily.     Vital Signs: BP (!) 106/55 (BP Location: Left Arm)   Pulse 88   Temp 98.6 F (37 C) (Oral)   Resp 20   Ht 5' (1.524 m)   Wt 141 lb 1.6 oz (64 kg)   LMP 11/16/2012   SpO2 97%   BMI 27.56 kg/m     Physical Exam Cardiovascular:     Rate and Rhythm: Normal rate and regular rhythm.     Pulses: Normal pulses.     Heart sounds: Normal heart sounds.  Pulmonary:     Effort: Pulmonary effort is normal.     Comments: Shallow respirations, clear bilaterally Abdominal:     General: Bowel sounds are normal.     Tenderness: There is abdominal tenderness.     Comments: distention  Musculoskeletal:     Right lower leg: No edema.     Left lower leg: No edema.  Skin:    General: Skin is warm and dry.  Neurological:     Mental Status: She is alert and oriented to person, place, and time.   Psychiatric:        Mood and Affect: Mood normal.        Behavior: Behavior normal.      Imaging: US RENAL Result Date: 11/03/2023 CLINICAL DATA:  Metastatic colorectal cancer discovered on CT abdomen and pelvis 10/30/2023, presents with acute kidney injury. [id number]. EXAM: RENAL / URINARY TRACT ULTRASOUND COMPLETE COMPARISON:  CT with IV contrast 10/30/2023. FINDINGS: Right Kidney: Renal measurements: 12.5 x 4.3 x 5.8 cm = volume: 164.86 mL. Echogenicity within normal limits. No mass, stones or hydronephrosis visualized. Left Kidney: Renal measurements: 11.6 x  5.6 x 3.8 cm = volume: 128.80 mL. Echogenicity within normal limits. No mass, stones or hydronephrosis visualized. Bladder: Contracted and not well seen. Other: Heterogeneous liver with diffuse metastases. Minimal pelvic ascites. IMPRESSION: 1. No evidence of hydronephrosis. Sonographically unremarkable kidneys. 2. Heterogeneous liver with diffuse metastases. 3. Minimal pelvic ascites. Electronically Signed   By: Almira Bar M.D.   On: 11/03/2023 01:43   CT CHEST WO CONTRAST Result Date: 11/02/2023 CLINICAL DATA:  Evaluate chest for metastases.  Rectal mass. EXAM: CT CHEST WITHOUT CONTRAST TECHNIQUE: Multidetector CT imaging of the chest was performed following the standard protocol without IV contrast. RADIATION DOSE REDUCTION: This exam was performed according to the departmental dose-optimization program which includes automated exposure control, adjustment of the mA and/or kV according to patient size and/or use of iterative reconstruction technique. COMPARISON:  None Available. FINDINGS: Cardiovascular: Heart is normal size. Aorta is normal caliber. Diffuse coronary artery calcifications throughout the left coronary arteries and scattered calcifications throughout the right coronary artery. Scattered aortic atherosclerosis. Mediastinum/Nodes: No mediastinal, hilar, or axillary adenopathy. Trachea and esophagus are unremarkable. Thyroid  unremarkable. Lungs/Pleura: Numerous bilateral pulmonary nodules compatible with metastases. The largest on the right is in the posteromedial right lower lobe on image 92 measuring 1.5 cm. Largest on the left is in the left lower lobe on image 86 measuring 1.1 cm. No effusions. Upper Abdomen: Heterogeneous appearance of the liver compatible with metastases as seen on prior abdominal CT. Musculoskeletal: Chest wall soft tissues are unremarkable. No acute bony abnormality or focal bone lesion. IMPRESSION: Numerous bilateral pulmonary nodules with largest nodule measured above. Findings compatible with metastases. Innumerable hepatic metastases better seen on prior abdominal CT. Extensive coronary artery disease. Aortic Atherosclerosis (ICD10-I70.0). Electronically Signed   By: Charlett Nose M.D.   On: 11/02/2023 21:39   ECHOCARDIOGRAM COMPLETE Result Date: 10/31/2023    ECHOCARDIOGRAM REPORT   Patient Name:   LOGEN FOWLE Rance Date of Exam: 10/31/2023 Medical Rec #:  [id number]            Height:       60.0 in Accession #:    [id number]           Weight:       140.4 lb Date of Birth:  11/18/1965             BSA:          1.606 m Patient Age:    57 years             BP:           103/64 mmHg Patient Gender: F                    HR:           82 bpm. Exam Location:  Inpatient Procedure: 2D Echo, Cardiac Doppler, Color Doppler and Strain Analysis (Both            Spectral and Color Flow Doppler were utilized during procedure). Indications:    Abnormal ECG 794.31 / R94.31  History:        Patient has prior history of Echocardiogram examinations, most                 recent 11/26/2020. CHF; Risk Factors:Hypertension and Current                 Smoker.  Sonographer:    Lucendia Herrlich RCS Referring Phys: [id number] BINAYA DAHAL IMPRESSIONS  1. Left ventricular ejection fraction,  by estimation, is 60 to 65%. The left ventricle has normal function. The left ventricle has no regional wall motion abnormalities. Left ventricular  diastolic parameters are indeterminate.  2. Right ventricular systolic function is normal. The right ventricular size is normal.  3. The mitral valve is normal in structure. Trivial mitral valve regurgitation. No evidence of mitral stenosis.  4. Tricuspid valve regurgitation is mild to moderate.  5. The aortic valve is normal in structure. Aortic valve regurgitation is not visualized. No aortic stenosis is present.  6. The inferior vena cava is normal in size with greater than 50% respiratory variability, suggesting right atrial pressure of 3 mmHg. FINDINGS  Left Ventricle: Left ventricular ejection fraction, by estimation, is 60 to 65%. The left ventricle has normal function. The left ventricle has no regional wall motion abnormalities. Global longitudinal strain performed but not reported based on interpreter judgement due to suboptimal tracking. The left ventricular internal cavity size was normal in size. There is no left ventricular hypertrophy. Left ventricular diastolic parameters are indeterminate. Right Ventricle: The right ventricular size is normal. No increase in right ventricular wall thickness. Right ventricular systolic function is normal. Left Atrium: Left atrial size was normal in size. Right Atrium: Right atrial size was normal in size. Pericardium: There is no evidence of pericardial effusion. Mitral Valve: The mitral valve is normal in structure. Trivial mitral valve regurgitation. No evidence of mitral valve stenosis. Tricuspid Valve: The tricuspid valve is normal in structure. Tricuspid valve regurgitation is mild to moderate. No evidence of tricuspid stenosis. Aortic Valve: The aortic valve is normal in structure. Aortic valve regurgitation is not visualized. No aortic stenosis is present. Aortic valve peak gradient measures 11.3 mmHg. Pulmonic Valve: The pulmonic valve was normal in structure. Pulmonic valve regurgitation is mild. No evidence of pulmonic stenosis. Aorta: The aortic root is  normal in size and structure. Venous: The inferior vena cava is normal in size with greater than 50% respiratory variability, suggesting right atrial pressure of 3 mmHg. IAS/Shunts: No atrial level shunt detected by color flow Doppler.  LEFT VENTRICLE PLAX 2D LVIDd:         4.30 cm   Diastology LVIDs:         2.90 cm   LV e' medial:    6.42 cm/s LV PW:         0.90 cm   LV E/e' medial:  7.9 LV IVS:        0.90 cm   LV e' lateral:   7.94 cm/s LVOT diam:     2.10 cm   LV E/e' lateral: 6.4 LV SV:         62 LV SV Index:   38 LVOT Area:     3.46 cm  RIGHT VENTRICLE             IVC RV S prime:     17.20 cm/s  IVC diam: 1.10 cm TAPSE (M-mode): 2.4 cm LEFT ATRIUM             Index        RIGHT ATRIUM          Index LA diam:        3.90 cm 2.43 cm/m   RA Area:     9.22 cm LA Vol (A2C):   35.9 ml 22.35 ml/m  RA Volume:   15.00 ml 9.34 ml/m LA Vol (A4C):   52.9 ml 32.94 ml/m LA Biplane Vol: 43.4 ml 27.02 ml/m  AORTIC VALVE  AV Area (Vmax): 2.21 cm AV Vmax:        168.00 cm/s AV Peak Grad:   11.3 mmHg LVOT Vmax:      107.00 cm/s LVOT Vmean:     70.600 cm/s LVOT VTI:       0.178 m  AORTA Ao Root diam: 3.40 cm Ao Asc diam:  3.40 cm MITRAL VALVE                TRICUSPID VALVE MV Area (PHT): 4.31 cm     TR Peak grad:   30.9 mmHg MV Decel Time: 176 msec     TR Vmax:        278.00 cm/s MR Peak grad: 29.4 mmHg MR Vmax:      271.00 cm/s   SHUNTS MV E velocity: 50.60 cm/s   Systemic VTI:  0.18 m MV A velocity: 100.00 cm/s  Systemic Diam: 2.10 cm MV E/A ratio:  0.51 Kardie Tobb DO Electronically signed by Thomasene Ripple DO Signature Date/Time: 10/31/2023/3:26:54 PM    Final    CT ABDOMEN PELVIS W CONTRAST Result Date: 10/30/2023 CLINICAL DATA:  Abdominal pain and bloating.  Abnormal ultrasound EXAM: CT ABDOMEN AND PELVIS WITH CONTRAST TECHNIQUE: Multidetector CT imaging of the abdomen and pelvis was performed using the standard protocol following bolus administration of intravenous contrast. RADIATION DOSE REDUCTION: This exam  was performed according to the departmental dose-optimization program which includes automated exposure control, adjustment of the mA and/or kV according to patient size and/or use of iterative reconstruction technique. CONTRAST:  75mL OMNIPAQUE IOHEXOL 350 MG/ML SOLN COMPARISON:  Ultrasound same day FINDINGS: Lower chest: Bilateral round lower lobe pulmonary nodules. Nodules of varying size. Nodule in RIGHT lower lobe medially along the pleural surface measures 14 mm (image 12). Nodule LEFT lower lobe measures 5 mm on image 7. Proximally 3-6 nodules in each lung base. Hepatobiliary: Innumerable hypoenhancing lesions within LEFT and RIGHT hepatic lobe. The liver is expanded by this near confluent pattern. Largest lesion in the RIGHT hepatic lobe measures 6.5 cm (image 14/3). Small example lesion in the LEFT hepatic lobe measures 2 cm image 18/3. These lesions are too numerous to count range in size from 10 mm to 65 mm. Gallbladder collapsed Pancreas: Pancreas is normal. No ductal dilatation. No pancreatic inflammation. Spleen: Normal spleen Adrenals/urinary tract: Adrenal glands and kidneys are normal. The ureters and bladder normal. Stomach/Bowel: Stomach, duodenum small-bowel normal. Appendix not identified. Ascending and transverse colon normal. Descending colon normal. There is asymmetric thickening in the mid and distal rectum along the LEFT wall of the rectum measuring 3.2 cm (image 64/3). Bulky endoluminal rectal mass is well seen on coronal imaging measuring 4.8 cm (image 26/6). enlarged lymph nodes in the LEFT mesorectum measuring 10-12 mm on image 64/3. Exophytic extension from the rectum to the presacral space measuring 3.7 cm on image 60/3 Vascular/Lymphatic: No upper abdominal adenopathy. Axial adenopathy described above. Reproductive: Post hysterectomy anatomy. There is irregularity in the upper aspect of the vagina/vaginal cuff (image 63/3) Other: No free fluid. Musculoskeletal: No aggressive osseous  lesion. IMPRESSION: 1. Bulky rectal mass with extension to the presacral space most consistent with primary rectal adenocarcinoma. No evidence of bowel obstruction. 2. Local nodal metastasis to the mesorectum. 3. Potential local extension to upper vagina.  Post hysterectomy. 4. Innumerable HEPATIC METASTASIS. 5. Bilateral lower lobe pulmonary nodules consistent with PULMONARY METASTASIS. Findings conveyed toKommer, MD on 10/30/2023  at14:03. Electronically Signed   By: Genevive Bi M.D.   On: 10/30/2023  14:03   US Abdomen Limited RUQ (LIVER/GB) Result Date: 10/30/2023 CLINICAL DATA:  [id number] Transaminitis [id number] EXAM: ULTRASOUND ABDOMEN LIMITED RIGHT UPPER QUADRANT COMPARISON:  None Available. FINDINGS: Gallbladder: Partially contracted. No gallstones or wall thickening visualized. No sonographic Murphy sign noted by sonographer. Common bile duct: Diameter: 6 mm Liver: Diffusely heterogeneous appearance of the liver with coarsened echotexture and generally increased parenchymal echogenicity. Possible solid mass in the caudate lobe measuring 4.5 cm. Nodular hepatic surface contour. Portal vein is patent on color Doppler imaging with normal direction of blood flow towards the liver. Other: None. IMPRESSION: 1. Cirrhotic morphology of the liver with possible solid mass in the caudate lobe measuring 4.5 cm. Further evaluation with contrast-enhanced MRI of the abdomen is recommended. 2. No evidence of cholelithiasis or acute cholecystitis. Electronically Signed   By: Duanne Guess D.O.   On: 10/30/2023 13:16   DG Chest 2 View Result Date: 10/30/2023 CLINICAL DATA:  Shortness of breath EXAM: CHEST - 2 VIEW COMPARISON:  09/03/2015 FINDINGS: The heart size and mediastinal contours are within normal limits. No focal airspace consolidation, pleural effusion, or pneumothorax. The visualized skeletal structures are unremarkable. IMPRESSION: No active cardiopulmonary disease. Electronically Signed   By: Duanne Guess D.O.   On: 10/30/2023 12:00   DG Abd 1 View Result Date: 10/26/2023 CLINICAL DATA:  Abdominal bloating. EXAM: ABDOMEN - 1 VIEW COMPARISON:  Radiograph dated 09/09/2009. FINDINGS: Mild colonic stool burden. No bowel dilatation or evidence of obstruction. No free air. The osseous structures are intact. The soft tissues are unremarkable IMPRESSION: Nonobstructive bowel gas pattern. Electronically Signed   By: Elgie Collard M.D.   On: 10/26/2023 15:36   US THYROID Result Date: 10/13/2023 CLINICAL DATA:  Hyperthyroid. EXAM: THYROID ULTRASOUND TECHNIQUE: Ultrasound examination of the thyroid gland and adjacent soft tissues was performed. COMPARISON:  None Available. FINDINGS: Parenchymal Echotexture: Moderately heterogeneous Isthmus: 0.5 cm Right lobe: 4.2 x 1.6 x 1.8 cm Left lobe: 5.0 x 3.0 x 2.3 cm ________________________________________________________ Estimated total number of nodules >/= 1 cm: 4 Number of spongiform nodules >/=  2 cm not described below (TR1): 0 Number of mixed cystic and solid nodules >/= 1.5 cm not described below (TR2): 0 _________________________________________________________ Nodule # 1: Location: Isthmus; Superior Maximum size: 0.6 cm; Other 2 dimensions: 0.4 x 0.5 cm Composition: spongiform (0) Echogenicity: hypoechoic (2) Shape: not taller-than-wide (0) Margins: smooth (0) Echogenic foci: none (0) ACR TI-RADS total points: 2. ACR TI-RADS risk category: TR2 (2 points). ACR TI-RADS recommendations: This nodule does NOT meet TI-RADS criteria for biopsy or dedicated follow-up. _________________________________________________________ Nodule # 2: Location: Right; Superior Maximum size: 1.1 cm; Other 2 dimensions: 1.1 x 0.9 cm Composition: mixed cystic and solid (1) Echogenicity: isoechoic (1) Shape: not taller-than-wide (0) Margins: smooth (0) Echogenic foci: macrocalcifications (1) ACR TI-RADS total points: 3. ACR TI-RADS risk category: TR3 (3 points). ACR TI-RADS  recommendations: Given size (<1.4 cm) and appearance, this nodule does NOT meet TI-RADS criteria for biopsy or dedicated follow-up. _________________________________________________________ Nodule # 3: Location: Right; Mid Maximum size: 0.7 cm; Other 2 dimensions: 0.6 x 0.6 cm Composition: mixed cystic and solid (1) Echogenicity: isoechoic (1) Shape: not taller-than-wide (0) Margins: smooth (0) Echogenic foci: none (0) ACR TI-RADS total points: 2. ACR TI-RADS risk category: TR2 (2 points). ACR TI-RADS recommendations: This nodule does NOT meet TI-RADS criteria for biopsy or dedicated follow-up. _________________________________________________________ Nodule # 4: Location: Right; Inferior Maximum size: 1.0 cm; Other 2 dimensions: 1.0 x 0.7 cm Composition: mixed cystic and solid (  1) Echogenicity: isoechoic (1) Shape: not taller-than-wide (0) Margins: smooth (0) Echogenic foci: none (0) ACR TI-RADS total points: 2. ACR TI-RADS risk category: TR2 (2 points). ACR TI-RADS recommendations: This nodule does NOT meet TI-RADS criteria for biopsy or dedicated follow-up. _________________________________________________________ Nodule # 5: Location: Left; Superior Maximum size: 2.0 cm; Other 2 dimensions: 1.8 x 1.4 cm Composition: mixed cystic and solid (1) Echogenicity: isoechoic (1) Shape: not taller-than-wide (0) Margins: smooth (0) Echogenic foci: none (0) ACR TI-RADS total points: 2. ACR TI-RADS risk category: TR2 (2 points). ACR TI-RADS recommendations: This nodule does NOT meet TI-RADS criteria for biopsy or dedicated follow-up. _________________________________________________________ Nodule # 6: Location: Left; Mid Maximum size: 0.8 cm; Other 2 dimensions: 0.8 x 0.6 cm Composition: spongiform (0) Echogenicity: hypoechoic (2) Shape: not taller-than-wide (0) Margins: smooth (0) Echogenic foci: none (0) ACR TI-RADS total points: 2. ACR TI-RADS risk category: TR2 (2 points). ACR TI-RADS recommendations: This nodule does  NOT meet TI-RADS criteria for biopsy or dedicated follow-up. _________________________________________________________ Pseudo nodularity about the left inferior thyroid without discrete margins. No cervical lymphadenopathy. IMPRESSION: 1. Multinodular thyroid. 2. Each of the visualized bilateral thyroid nodules appears benign and does not warrant additional follow-up. The above is in keeping with the ACR TI-RADS recommendations - J Am Coll Radiol 2017;14:587-595. Marliss Coots, MD [facility] Electronically Signed   By: Marliss Coots M.D.   On: 10/13/2023 11:06    Labs:  CBC: Recent Labs    10/31/23 0258 11/01/23 0330 11/02/23 1012 11/03/23 0228  WBC 14.4* 14.9* 14.5* 15.6*  HGB 9.9* 10.0* 10.4* 9.9*  HCT 28.3* 28.0* 29.5* 27.7*  PLT 469* 454* 453* 452*    COAGS: Recent Labs    10/31/23 0258 10/31/23 1028  INR 1.3* 1.3*    BMP: Recent Labs    10/31/23 0258 11/01/23 0330 11/02/23 1012 11/03/23 0228  NA 135 130* 128* 131*  K 4.3 4.3 5.2* 5.1  CL 99 97* 98 97*  CO2 21* 19* 16* 19*  GLUCOSE 94 102* 108* 100*  BUN 50* 54* 78* 81*  CALCIUM 8.3* 8.2* 7.9* 8.3*  CREATININE 1.73* 2.20* 3.52* 3.44*  GFRNONAA 34* 25* 14* 15*    LIVER FUNCTION TESTS: Recent Labs    10/30/23 1041 10/31/23 0258 11/01/23 0330 11/03/23 0228  BILITOT 10.9* 10.2* 11.1* 14.7*  AST 355* 423* 422* 926*  ALT 232* 268* 293* 505*  ALKPHOS 663* 585* 521* 510*  PROT 6.6 6.3* 6.3* 6.2*  ALBUMIN 2.7* 2.5* 2.5* 2.5*     Assessment and Plan:  Hepatic lesion; Request for image guided liver lesion biopsy - Approved by IR, plan for liver biopsy 3/19 - 3/19 Hgb 9.9, WBC 15.6, plt 452 - 3/16 INR 1.3; 3/19 INR 1.5 - NPO since 7a 3/19 - lovenox held 24 hr prior - VSS, afebrile - pt agrees with plan and moving forward with procedure  Risks and benefits of liver biopsy was discussed with the patient and patient's family (significant other at  bedside) including, but not limited to bleeding, infection, damage to adjacent structures or low yield requiring additional tests.  All of the questions were answered and there is agreement to proceed.  Consent signed and in chart.   Thank you for allowing our service to participate in Arella Blinder 's care.    Electronically Signed: Carlton Adam, NP   11/03/2023, 8:37 AM     I spent a total of 20 Minutes  in face to face in clinical consultation, greater  than 50% of which was counseling/coordinating care for request for image guided liver biopsy for liver lesion diagnostic testing.    (A copy of this note was sent to the referring provider and the time of visit.)

## 2023-11-03 NOTE — Progress Notes (Signed)
 Kim Underwood is an 58 y.o. female  w/ HTN,  NICM with ejection fraction of 15 to 20% back in 2014 which has recovered, tobacco use presenting with a 7 to 10-day history of abdominal fullness, bloating, nausea, anorexia; CT with contrast showed a rectal mass w/ numerous hepatic metastases and nodules in the lower lobes of the lung bilaterally.  Colonoscopy showed a fungating nonobstructing large mass in the rectum close to the anal verge as well as multiple sessile polyps in the descending colon.    Assessment/Plan: Acute renal failure secondary to contrast nephropathy context of relative hypotension with systolics in the 90s to 100s she is significant given that she is on multiple antihypertensives at home. PVR 0; and renal ultrasound no e/o hydro -FeNA <1% but that can be consistent with CIN as well.  She is already net +2.7 L during this hospitalization.  - Counseled the patient as she was not using the urinal hat and thus urine output is not recorded at all.  Need strict I's and O's. - Lokelma x1 (already given); will give another Lokelma today. - Antihypertensives have been on hold appropriately.  -Hopefully she can avoid dialysis; with all the new findings she is understandably overwhelmed and depressed.  No absolute indication for dialysis and will evaluate daily.  If there is no improvement in the next 24 to 48 hours then will have IR place and initiate dialysis heading into the weekend.  -Monitor  Daily weight  -Maintain MAP>65 for optimal renal perfusion.  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  Status post CT chest. H/o NICM history of ejection fraction 15 to 20% in 2014 which has recovered to 50% in 2022 and 60 to 65% this hospitalization followed by Dr. Shirlee Latch.  She is certainly compensated. History of hypertension and  antihypertensives are currently on hold  Subjective: Denies any further n/v but appetite not great.  Started to collect urine in the hat.  Denies shortness of breath.   Chemistry and CBC: Creat  Date/Time Value Ref Range Status  12/12/2012 01:58 PM 0.72 0.50 - 1.10 mg/dL Final   Creatinine, Ser  Date/Time Value Ref Range Status  11/03/2023 02:28 AM 3.44 (H) 0.44 - 1.00 mg/dL Final  16/05/9603 54:09 AM 3.52 (H) 0.44 - 1.00 mg/dL Final    Comment:    DELTA CHECK NOTED  11/01/2023 03:30 AM 2.20 (H) 0.44 - 1.00 mg/dL Final  81/19/1478 29:56 AM 1.73 (H) 0.44 - 1.00 mg/dL Final  21/30/8657 84:69 AM 1.41 (H) 0.44 - 1.00 mg/dL Final  62/95/2841 32:44 AM 0.76 0.44 - 1.00 mg/dL Final  08/19/7251 66:44 PM 0.79 0.44 - 1.00 mg/dL Final  03/47/4259 56:38 AM 0.69 0.44 - 1.00 mg/dL Final  75/64/3329 51:88 AM 0.75 0.44 - 1.00 mg/dL Final  41/66/0630 16:01 AM 0.67 0.44 - 1.00 mg/dL Final  09/32/3557 32:20 AM 0.68 0.44 - 1.00 mg/dL Final  25/42/7062 37:62 AM 0.71 0.44 - 1.00 mg/dL Final  83/15/1761 60:73 AM 0.66 0.44 - 1.00 mg/dL Final  71/01/2693 85:46 AM 0.71 0.44 - 1.00 mg/dL Final  27/10/5007 38:18 PM 0.73 0.44 - 1.00 mg/dL Final  29/93/7169 67:89 AM 0.69 0.44 - 1.00 mg/dL Final  38/05/1750 02:58 PM 0.71 0.44 - 1.00 mg/dL Final  52/77/8242 35:36 AM 0.62 0.50 - 1.10 mg/dL Final  14/43/1540 08:67 PM 0.57 0.50 - 1.10 mg/dL  Final  08/03/2013 08:45 AM 0.6 0.4 - 1.2 mg/dL Final  16/05/9603 54:09 AM 0.7 0.4 - 1.2 mg/dL Final  81/19/1478 29:56 AM 0.59 0.50 - 1.10 mg/dL Final  21/30/8657 84:69 AM 0.82 0.50 - 1.10 mg/dL Final  62/95/2841 32:44 PM 0.72 0.50 - 1.10 mg/dL Final  08/19/7251 66:44 AM 0.9 0.4 - 1.2 mg/dL Final  03/47/4259 56:38 AM 0.8 0.4 - 1.2 mg/dL Final  75/64/3329 51:88 AM 0.8 0.4 - 1.2 mg/dL Final   Recent Labs  Lab 10/30/23 1041 10/31/23 0258 11/01/23 0330 11/02/23 1012 11/03/23 0228  NA 135 135 130* 128* 131*  K 4.1 4.3 4.3 5.2* 5.1  CL 100 99 97* 98 97*  CO2 20* 21* 19*  16* 19*  GLUCOSE 131* 94 102* 108* 100*  BUN 42* 50* 54* 78* 81*  CREATININE 1.41* 1.73* 2.20* 3.52* 3.44*  CALCIUM 8.7* 8.3* 8.2* 7.9* 8.3*   Recent Labs  Lab 10/30/23 1041 10/31/23 0258 11/01/23 0330 11/02/23 1012 11/03/23 0228  WBC 15.2* 14.4* 14.9* 14.5* 15.6*  NEUTROABS 11.8*  --  11.3*  --   --   HGB 10.6* 9.9* 10.0* 10.4* 9.9*  HCT 30.8* 28.3* 28.0* 29.5* 27.7*  MCV 88.3 87.1 85.9 87.8 86.0  PLT 536* 469* 454* 453* 452*   Liver Function Tests: Recent Labs  Lab 10/31/23 0258 11/01/23 0330 11/03/23 0228  AST 423* 422* 926*  ALT 268* 293* 505*  ALKPHOS 585* 521* 510*  BILITOT 10.2* 11.1* 14.7*  PROT 6.3* 6.3* 6.2*  ALBUMIN 2.5* 2.5* 2.5*   Recent Labs  Lab 10/30/23 1041  LIPASE 109*   No results for input(s): "AMMONIA" in the last 168 hours. Cardiac Enzymes: No results for input(s): "CKTOTAL", "CKMB", "CKMBINDEX", "TROPONINI" in the last 168 hours. Iron Studies: No results for input(s): "IRON", "TIBC", "TRANSFERRIN", "FERRITIN" in the last 72 hours. PT/INR: @LABRCNTIP (inr:5)  Xrays/Other Studies: ) Results for orders placed or performed during the hospital encounter of 10/30/23 (from the past 48 hours)  Na and K (sodium & potassium), rand urine     Status: None   Collection Time: 11/02/23  4:20 AM  Result Value Ref Range   Sodium, Ur 26 mmol/L   Potassium Urine 43 mmol/L    Comment: Performed at [facility], [address] [place], [place] [address]  Creatinine, urine, random     Status: None   Collection Time: 11/02/23  4:20 AM  Result Value Ref Range   Creatinine, Urine 200 mg/dL    Comment: Performed at [facility], [address], [place], [place] [address]  Basic metabolic panel     Status: Abnormal   Collection Time: 11/02/23 10:12 AM  Result Value Ref Range   Sodium 128 (L) 135 - 145 mmol/L   Potassium 5.2 (H) 3.5 - 5.1 mmol/L   Chloride 98 98 - 111 mmol/L   CO2 16 (L) 22 - 32 mmol/L   Glucose, Bld 108 (H) 70 - 99 mg/dL     Comment: Glucose reference range applies only to samples taken after fasting for at least 8 hours.   BUN 78 (H) 6 - 20 mg/dL   Creatinine, Ser 0.10 (H) 0.44 - 1.00 mg/dL    Comment: DELTA CHECK NOTED   Calcium 7.9 (L) 8.9 - 10.3 mg/dL   GFR, Estimated 14 (L) >60 mL/min    Comment: (NOTE) Calculated using the CKD-EPI Creatinine Equation (2021)    Anion gap 14 5 - 15    Comment: Performed at [facility], [address] [place], [place] [address]  CBC     Status: Abnormal   Collection Time: 11/02/23 10:12 AM  Result Value Ref Range   WBC 14.5 (H) 4.0 - 10.5 K/uL   RBC 3.36 (L) 3.87 - 5.11 MIL/uL   Hemoglobin 10.4 (L) 12.0 - 15.0 g/dL   HCT 62.1 (L) 30.8 - 65.7 %   MCV 87.8 80.0 - 100.0 fL   MCH 31.0 26.0 - 34.0 pg   MCHC 35.3 30.0 - 36.0 g/dL   RDW 84.6 (H) 96.2 - 95.2 %   Platelets 453 (H) 150 - 400 K/uL   nRBC 0.0 0.0 - 0.2 %    Comment: Performed at [facility], [address] [place], [place] [address]  Basic metabolic panel     Status: Abnormal   Collection Time: 11/03/23  2:28 AM  Result Value Ref Range   Sodium 131 (L) 135 - 145 mmol/L   Potassium 5.1 3.5 - 5.1 mmol/L   Chloride 97 (L) 98 - 111 mmol/L   CO2 19 (L) 22 - 32 mmol/L   Glucose, Bld 100 (H) 70 - 99 mg/dL    Comment: Glucose reference range applies only to samples taken after fasting for at least 8 hours.   BUN 81 (H) 6 - 20 mg/dL   Creatinine, Ser 4.40 (H) 0.44 - 1.00 mg/dL   Calcium 8.3 (L) 8.9 - 10.3 mg/dL   GFR, Estimated 15 (L) >60 mL/min    Comment: (NOTE) Calculated using the CKD-EPI Creatinine Equation (2021)    Anion gap 15 5 - 15    Comment: Performed at [facility], [address], [place], [place] [address]  CBC     Status: Abnormal   Collection Time: 11/03/23  2:28 AM  Result Value Ref Range   WBC 15.6 (H) 4.0 - 10.5 K/uL   RBC 3.22 (L) 3.87 - 5.11 MIL/uL   Hemoglobin 9.9 (L) 12.0 - 15.0 g/dL   HCT 53.6 (L) 64.4 - 03.4 %   MCV 86.0 80.0 - 100.0 fL   MCH 30.7 26.0 -  34.0 pg   MCHC 35.7 30.0 - 36.0 g/dL   RDW 74.2 (H) 59.5 - 63.8 %   Platelets 452 (H) 150 - 400 K/uL   nRBC 0.0 0.0 - 0.2 %    Comment: Performed at [facility], [address], [place], [place] [address]  Lactate dehydrogenase     Status: Abnormal   Collection Time: 11/03/23  2:28 AM  Result Value Ref Range   LDH 1,671 (H) 98 - 192 U/L    Comment: Performed at [facility], [address] [place], [place] [address]  Hepatic function panel     Status: Abnormal   Collection Time: 11/03/23  2:28 AM  Result Value Ref Range   Total Protein 6.2 (L) 6.5 - 8.1 g/dL   Albumin 2.5 (L) 3.5 - 5.0 g/dL   AST 884 (H) 15 - 41 U/L   ALT 505 (H) 0 - 44 U/L   Alkaline Phosphatase 510 (H) 38 - 126 U/L   Total Bilirubin 14.7 (H) 0.0 - 1.2 mg/dL   Bilirubin, Direct 8.8 (H) 0.0 - 0.2 mg/dL   Indirect Bilirubin 5.9 (H) 0.3 - 0.9 mg/dL    Comment: Performed at [facility], [address], [place], [place] [address]   US RENAL Result Date: 11/03/2023 CLINICAL DATA:  Metastatic colorectal cancer discovered on CT  abdomen and pelvis 10/30/2023, presents with acute kidney injury. [id number]. EXAM: RENAL / URINARY TRACT ULTRASOUND COMPLETE COMPARISON:  CT with IV contrast 10/30/2023. FINDINGS: Right Kidney: Renal measurements: 12.5 x 4.3 x 5.8 cm = volume: 164.86 mL. Echogenicity within normal limits. No mass, stones or hydronephrosis visualized. Left Kidney: Renal measurements: 11.6 x 5.6 x 3.8 cm = volume: 128.80 mL. Echogenicity within normal limits. No mass, stones or hydronephrosis visualized. Bladder: Contracted and not well seen. Other: Heterogeneous liver with diffuse metastases. Minimal pelvic ascites. IMPRESSION: 1. No evidence of hydronephrosis. Sonographically unremarkable kidneys. 2. Heterogeneous liver with diffuse metastases. 3. Minimal pelvic ascites. Electronically Signed   By: Almira Bar M.D.   On: 11/03/2023 01:43   CT CHEST WO CONTRAST Result Date: 11/02/2023 CLINICAL DATA:   Evaluate chest for metastases.  Rectal mass. EXAM: CT CHEST WITHOUT CONTRAST TECHNIQUE: Multidetector CT imaging of the chest was performed following the standard protocol without IV contrast. RADIATION DOSE REDUCTION: This exam was performed according to the departmental dose-optimization program which includes automated exposure control, adjustment of the mA and/or kV according to patient size and/or use of iterative reconstruction technique. COMPARISON:  None Available. FINDINGS: Cardiovascular: Heart is normal size. Aorta is normal caliber. Diffuse coronary artery calcifications throughout the left coronary arteries and scattered calcifications throughout the right coronary artery. Scattered aortic atherosclerosis. Mediastinum/Nodes: No mediastinal, hilar, or axillary adenopathy. Trachea and esophagus are unremarkable. Thyroid unremarkable. Lungs/Pleura: Numerous bilateral pulmonary nodules compatible with metastases. The largest on the right is in the posteromedial right lower lobe on image 92 measuring 1.5 cm. Largest on the left is in the left lower lobe on image 86 measuring 1.1 cm. No effusions. Upper Abdomen: Heterogeneous appearance of the liver compatible with metastases as seen on prior abdominal CT. Musculoskeletal: Chest wall soft tissues are unremarkable. No acute bony abnormality or focal bone lesion. IMPRESSION: Numerous bilateral pulmonary nodules with largest nodule measured above. Findings compatible with metastases. Innumerable hepatic metastases better seen on prior abdominal CT. Extensive coronary artery disease. Aortic Atherosclerosis (ICD10-I70.0). Electronically Signed   By: Charlett Nose M.D.   On: 11/02/2023 21:39    PMH:   Past Medical History:  Diagnosis Date   CHF (congestive heart failure) (HCC)    Chronic systolic heart failure (HCC)    a. NICM b. ECHO (11/2012): EF 15-20%, Diff HK, grade IV DD, mod MR, LA mildly dilated c. RHC (02/2013): RA: 3, RV: 35/7, PA: 32/11 (19), PCWP:  6, PA sat 69%, Fick CO/CI: 4.6 / 2.8 d. ECHO (12/2013): EF 35-40%, diff HK, grade I DD, trivial MR, LA mildly dialted, RV nl   Hypertension, malignant    NICM (nonischemic cardiomyopathy) (HCC)    LHC (02/2013): Lmain: no sig. dz, LAD: luminal irregularities, LCx: luminal irregularities, co-dominant, RCA: luminal irregularities, rel small, co-dominant with LCx   Tobacco abuse     PSH:   Past Surgical History:  Procedure Laterality Date   TUBAL LIGATION  2000    Allergies: No Known Allergies  Medications:   Prior to Admission medications   Medication Sig Start Date End Date Taking? Authorizing Provider  acetaminophen (TYLENOL) 325 MG tablet Take 650 mg by mouth every 4 (four) hours as needed for moderate pain.   Yes [provider]  carvedilol (COREG) 6.25 MG tablet TAKE 1 TABLET BY MOUTH TWICE DAILY WITH MEALS . APPOINTMENT REQUIRED FOR FUTURE REFILLS 09/01/23  Yes Laurey Morale, MD  cetirizine (ZYRTEC ALLERGY) 10 MG tablet Take 1 tablet (10 mg total)  by mouth daily. Patient taking differently: Take 10 mg by mouth daily as needed for allergies. 08/07/22  Yes Wallis Bamberg, PA-C  hydrALAZINE (APRESOLINE) 50 MG tablet Take 1 tablet (50 mg total) by mouth 2 (two) times daily. NEEDS FOLLOW UP APPOINTMENT FOR MORE REFILLS 05/20/23  Yes Laurey Morale, MD  isosorbide mononitrate (IMDUR) 30 MG 24 hr tablet Take 1 tablet (30 mg total) by mouth daily. NEEDS FOLLOW UP APPOINTMENT FOR MORE REFILLS 05/20/23  Yes Laurey Morale, MD  sacubitril-valsartan (ENTRESTO) 49-51 MG Take 1 tablet by mouth 2 (two) times daily. 10/05/22  Yes Laurey Morale, MD  spironolactone (ALDACTONE) 25 MG tablet Take 1 tablet by mouth once daily 02/25/23  Yes Laurey Morale, MD  propylthiouracil (PTU) 50 MG tablet Take 1 tablet (50 mg total) by mouth daily in the afternoon. Patient not taking: Reported on 10/31/2023 10/19/23   Shamleffer, Konrad Dolores, MD    Discontinued Meds:   Medications Discontinued During  This Encounter  Medication Reason   enoxaparin (LOVENOX) injection 40 mg    buPROPion (WELLBUTRIN SR) 150 MG 12 hr tablet Patient Preference   predniSONE (DELTASONE) 10 MG tablet Completed Course   promethazine-dextromethorphan (PROMETHAZINE-DM) 6.25-15 MG/5ML syrup Completed Course   bisacodyl (DULCOLAX) EC tablet 5 mg    enoxaparin (LOVENOX) injection 40 mg     Social History:  reports that she has been smoking cigarettes. She has never used smokeless tobacco. She reports that she does not currently use alcohol. She reports that she does not use drugs.  Family History:   Family History  Problem Relation Age of Onset   Stroke Mother    Heart disease Mother    Heart disease Father     Blood pressure 101/71, pulse 88, temperature 98.1 F (36.7 C), temperature source Oral, resp. rate 19, height 5' (1.524 m), weight 64 kg, last menstrual period 11/16/2012, SpO2 96%. Physical Exam: General appearance: NAD Head: NCAT Neck: no JVD Back: No CVA tenderness. Cardio: regular rate and rhythm GI: distended Extremities: no edema Pulses: 2+ and symmetric Skin: Skin color, texture, turgor normal.      Ethelene Hal, MD 11/03/2023, 8:24 AM

## 2023-11-03 NOTE — Procedures (Signed)
 Interventional Radiology Procedure Note  Procedure: [place] LEFT LIVER MET 18 G CORE BXS    Complications: None  Estimated Blood Loss:  MIN  Findings: 64 G CORES IN [name], MD

## 2023-11-04 ENCOUNTER — Encounter (HOSPITAL_COMMUNITY): Payer: Self-pay | Admitting: Pediatrics

## 2023-11-04 DIAGNOSIS — K7689 Other specified diseases of liver: Secondary | ICD-10-CM

## 2023-11-04 DIAGNOSIS — C787 Secondary malignant neoplasm of liver and intrahepatic bile duct: Secondary | ICD-10-CM | POA: Diagnosis not present

## 2023-11-04 DIAGNOSIS — C189 Malignant neoplasm of colon, unspecified: Principal | ICD-10-CM

## 2023-11-04 DIAGNOSIS — K6289 Other specified diseases of anus and rectum: Secondary | ICD-10-CM | POA: Diagnosis not present

## 2023-11-04 DIAGNOSIS — N170 Acute kidney failure with tubular necrosis: Secondary | ICD-10-CM

## 2023-11-04 LAB — CBC
HCT: 28.5 % — ABNORMAL LOW (ref 36.0–46.0)
Hemoglobin: 9.8 g/dL — ABNORMAL LOW (ref 12.0–15.0)
MCH: 30.3 pg (ref 26.0–34.0)
MCHC: 34.4 g/dL (ref 30.0–36.0)
MCV: 88.2 fL (ref 80.0–100.0)
Platelets: 433 10*3/uL — ABNORMAL HIGH (ref 150–400)
RBC: 3.23 MIL/uL — ABNORMAL LOW (ref 3.87–5.11)
RDW: 19.3 % — ABNORMAL HIGH (ref 11.5–15.5)
WBC: 16.3 10*3/uL — ABNORMAL HIGH (ref 4.0–10.5)
nRBC: 0 % (ref 0.0–0.2)

## 2023-11-04 LAB — COMPREHENSIVE METABOLIC PANEL
ALT: 825 U/L — ABNORMAL HIGH (ref 0–44)
AST: 1785 U/L — ABNORMAL HIGH (ref 15–41)
Albumin: 2.4 g/dL — ABNORMAL LOW (ref 3.5–5.0)
Alkaline Phosphatase: 574 U/L — ABNORMAL HIGH (ref 38–126)
Anion gap: 18 — ABNORMAL HIGH (ref 5–15)
BUN: 99 mg/dL — ABNORMAL HIGH (ref 6–20)
CO2: 16 mmol/L — ABNORMAL LOW (ref 22–32)
Calcium: 8.5 mg/dL — ABNORMAL LOW (ref 8.9–10.3)
Chloride: 98 mmol/L (ref 98–111)
Creatinine, Ser: 4.6 mg/dL — ABNORMAL HIGH (ref 0.44–1.00)
GFR, Estimated: 10 mL/min — ABNORMAL LOW (ref >60)
Glucose, Bld: 77 mg/dL (ref 70–99)
Potassium: 6.4 mmol/L (ref 3.5–5.1)
Sodium: 132 mmol/L — ABNORMAL LOW (ref 135–145)
Total Bilirubin: 17.2 mg/dL — ABNORMAL HIGH (ref 0.0–1.2)
Total Protein: 6.3 g/dL — ABNORMAL LOW (ref 6.5–8.1)

## 2023-11-04 LAB — GLUCOSE, CAPILLARY
Glucose-Capillary: 56 mg/dL — ABNORMAL LOW (ref 70–99)
Glucose-Capillary: 170 mg/dL — ABNORMAL HIGH (ref 70–99)

## 2023-11-04 LAB — MAGNESIUM: Magnesium: 3.3 mg/dL — ABNORMAL HIGH (ref 1.7–2.4)

## 2023-11-04 LAB — SURGICAL PATHOLOGY

## 2023-11-04 MED ORDER — DEXTROSE 50 % IV SOLN
1.0000 | Freq: Once | INTRAVENOUS | Status: AC
  Administered 2023-11-04: 50 mL via INTRAVENOUS
  Filled 2023-11-04: qty 50

## 2023-11-04 MED ORDER — SODIUM ZIRCONIUM CYCLOSILICATE 10 G PO PACK
10.0000 g | PACK | Freq: Once | ORAL | Status: AC
  Administered 2023-11-04: 10 g via ORAL
  Filled 2023-11-04: qty 1

## 2023-11-04 MED ORDER — DEXTROSE 50 % IV SOLN: 1.0000 | INTRAVENOUS | Status: DC | PRN

## 2023-11-04 NOTE — Progress Notes (Signed)
 PROGRESS NOTE    Kim Underwood  UYQ:[id number] DOB: [birth year] DOA: 10/30/2023 PCP: Patient, No Pcp Per   Brief Narrative:  58 y.o. female with PMH significant for hypertension, chronic systolic CHF/nonischemic cardiomyopathy currently with improved EF presented for evaluation of abdominal bloating, fullness. CT abdomen/ pelvis showed bulky rectal mass with extension to the presacral space most consistent with primary rectal adenocarcinoma.  GI was consulted.  She underwent colonoscopy on 11/02/2023 which showed possible malignant tumor in the rectum which was biopsied.  Oncology was consulted.  Nephrology also consulted for AKI.  She underwent liver biopsy by IR on 11/03/2023.  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  Underwent colonoscopy on 11/02/2023.  Pathology pending. -Oncology evaluation appreciated.   -She underwent liver biopsy by IR on 11/03/2023.  Follow pathology.  Elevated liver enzymes including elevated bilirubin Possible cirrhosis of liver -Possible cirrhosis of liver noted ultrasound.  LFTs continue to trend upwards.  Monitor.  Labs pending today. -Outpatient follow-up with GI. -Acute hepatitis panel negative.  Follow liver biopsy pathology.  Acute kidney injury Acute metabolic acidosis -Possibly secondary to contrast nephropathy in the context of relative hypotension -Renal ultrasound negative for hydronephrosis. -Nephrology following.  Creatinine 3.52 on 11/02/2023.  Improving to 3.44 on 11/03/2023.  Monitor.  History of chronic systolic CHF/nonischemic cardiomyopathy Hypotension History of hypertension -Last echo in 2022 had shown that EF had recovered to 50%. -Echo during this hospitalization showed EF of 60 to 65%.  Currently compensated.  Coreg, Imdur, Entresto and Aldactone on hold due to  hypotension.  Blood pressure still on the lower side  Leukocytosis -Worsening.  Monitor  Anemia of chronic disease -From chronic illnesses and malignancy.  Hemoglobin stable.  Monitor intermittently  Thrombocytosis -Possibly reactive.  Monitor intermittently  Hyperkalemia -Mild.  Resolved  Hyponatremia -Labs pending today.  Severe malnutrition -Follow nutrition recommendations  Goals of care -Overall prognosis is poor.  Patient is leaning towards home hospice.  Agreeable to change CODE STATUS to DNR.  DVT prophylaxis: Lovenox Code Status: DNR  family Communication: Boyfriend at bedside on 11/03/2023.  None at bedside today Disposition Plan: Status is: Inpatient Remains inpatient appropriate because: Of severity of illness.  Possible home with hospice once arrangements have been made.    Consultants: GI/oncology/nephrology/IR  Procedures: As above  Antimicrobials: None   Subjective: Patient seen and examined at bedside.  Continues to feel bloated.  No fever, chest pain, shortness of breath reported. Objective: Vitals:   11/03/23 1639 11/03/23 2012 11/04/23 0034 11/04/23 0430  BP: 97/61 94/65 105/69 110/68  Pulse: 86 88 86 86  Resp: 18 15 (!) 22 20  Temp: 98.2 F (36.8 C) 98.6 F (37 C) 98.2 F (36.8 C) 98.2 F (36.8 C)  TempSrc: Oral Oral Oral Oral  SpO2: 97% 95% 100% 96%  Weight:    65.4 kg  Height:        Intake/Output Summary (Last 24 hours) at 11/04/2023 0628 Last data filed at 11/04/2023 0500 Gross per 24 hour  Intake 389.4 ml  Output 300 ml  Net 89.4 ml   Filed Weights   10/31/23 0452 11/02/23 0501 11/04/23 0430  Weight: 63.7 kg 64 kg 65.4 kg    Examination:  General: On room air.  No distress ENT/neck: No thyromegaly.  JVD is not elevated  respiratory: Decreased breath sounds at bases bilaterally with some  crackles; no wheezing  CVS: S1-S2 heard, rate controlled currently Abdominal: Soft, nontender, slightly distended; no organomegaly,   bowel sounds are heard Extremities: Trace lower extremity edema; no cyanosis  CNS: Awake and alert.  No focal neurologic deficit.  Moves extremities Lymph: No obvious lymphadenopathy Skin: No obvious ecchymosis/lesions  psych: Mostly flat affect.  Currently not agitated.  Musculoskeletal: No obvious joint swelling/deformity     Data Reviewed: I have personally reviewed following labs and imaging studies  CBC: Recent Labs  Lab 10/30/23 1041 10/31/23 0258 11/01/23 0330 11/02/23 1012 11/03/23 0228 11/04/23 0230  WBC 15.2* 14.4* 14.9* 14.5* 15.6* 16.3*  NEUTROABS 11.8*  --  11.3*  --   --   --   HGB 10.6* 9.9* 10.0* 10.4* 9.9* 9.8*  HCT 30.8* 28.3* 28.0* 29.5* 27.7* 28.5*  MCV 88.3 87.1 85.9 87.8 86.0 88.2  PLT 536* 469* 454* 453* 452* 433*   Basic Metabolic Panel: Recent Labs  Lab 10/30/23 1041 10/31/23 0258 11/01/23 0330 11/02/23 1012 11/03/23 0228  NA 135 135 130* 128* 131*  K 4.1 4.3 4.3 5.2* 5.1  CL 100 99 97* 98 97*  CO2 20* 21* 19* 16* 19*  GLUCOSE 131* 94 102* 108* 100*  BUN 42* 50* 54* 78* 81*  CREATININE 1.41* 1.73* 2.20* 3.52* 3.44*  CALCIUM 8.7* 8.3* 8.2* 7.9* 8.3*   GFR: Estimated Creatinine Clearance: 14.9 mL/min (A) (by C-G formula based on SCr of 3.44 mg/dL (H)). Liver Function Tests: Recent Labs  Lab 10/30/23 1041 10/31/23 0258 11/01/23 0330 11/03/23 0228  AST 355* 423* 422* 926*  ALT 232* 268* 293* 505*  ALKPHOS 663* 585* 521* 510*  BILITOT 10.9* 10.2* 11.1* 14.7*  PROT 6.6 6.3* 6.3* 6.2*  ALBUMIN 2.7* 2.5* 2.5* 2.5*   Recent Labs  Lab 10/30/23 1041  LIPASE 109*   No results for input(s): "AMMONIA" in the last 168 hours. Coagulation Profile: Recent Labs  Lab 10/31/23 0258 10/31/23 1028 11/03/23 0844  INR 1.3* 1.3* 1.5*   Cardiac Enzymes: No results for input(s): "CKTOTAL", "CKMB", "CKMBINDEX", "TROPONINI" in the last 168 hours. BNP (last 3 results) No results for input(s): "PROBNP" in the last 8760 hours. HbA1C: No results  for input(s): "HGBA1C" in the last 72 hours. CBG: No results for input(s): "GLUCAP" in the last 168 hours. Lipid Profile: No results for input(s): "CHOL", "HDL", "LDLCALC", "TRIG", "CHOLHDL", "LDLDIRECT" in the last 72 hours. Thyroid Function Tests: No results for input(s): "TSH", "T4TOTAL", "FREET4", "T3FREE", "THYROIDAB" in the last 72 hours. Anemia Panel: No results for input(s): "VITAMINB12", "FOLATE", "FERRITIN", "TIBC", "IRON", "RETICCTPCT" in the last 72 hours. Sepsis Labs: No results for input(s): "PROCALCITON", "LATICACIDVEN" in the last 168 hours.  No results found for this or any previous visit (from the past 240 hours).       Radiology Studies: US BIOPSY (LIVER) Result Date: 11/03/2023 INDICATION: Liver metastases EXAM: ULTRASOUND CORE BIOPSY LEFT LIVER METASTASIS MEDICATIONS: 1% LIDOCAINE LOCAL ANESTHESIA/SEDATION: Moderate (conscious) sedation was employed during this procedure. A total of Versed 0.5 mg and Fentanyl 37.5 mcg was administered intravenously by the radiology nurse. Total intra-service moderate Sedation Time: 15 minutes. The patient's level of consciousness and vital signs were monitored continuously by radiology nursing throughout the procedure under my direct supervision. COMPLICATIONS: None immediate. PROCEDURE: Informed written consent was obtained from the patient after a thorough discussion of the procedural risks, benefits and alternatives. All questions were addressed. Maximal Sterile Barrier Technique was utilized including caps, mask, sterile gowns, sterile gloves, sterile drape, hand hygiene and  skin antiseptic. A timeout was performed prior to the initiation of the procedure. Previous imaging reviewed. Preliminary ultrasound performed. A left hepatic lesion was localized anteriorly and marked in the subxiphoid area. Under sterile conditions and local anesthesia, a 17 gauge guide needle was advanced into the lesion. Needle position confirmed with ultrasound.  18 gauge core biopsies obtained. Samples were intact and non fragmented. These were placed in formalin. Images obtained for documentation. Needle tract occluded with Gel-Foam. Postprocedure imaging demonstrates no hemorrhage or hematoma. Patient tolerated the biopsy well. IMPRESSION: Successful ultrasound left hepatic lesion 18 gauge core biopsy Electronically Signed   By: Judie Petit.  Shick M.D.   On: 11/03/2023 12:05   US RENAL Result Date: 11/03/2023 CLINICAL DATA:  Metastatic colorectal cancer discovered on CT abdomen and pelvis 10/30/2023, presents with acute kidney injury. [id number]. EXAM: RENAL / URINARY TRACT ULTRASOUND COMPLETE COMPARISON:  CT with IV contrast 10/30/2023. FINDINGS: Right Kidney: Renal measurements: 12.5 x 4.3 x 5.8 cm = volume: 164.86 mL. Echogenicity within normal limits. No mass, stones or hydronephrosis visualized. Left Kidney: Renal measurements: 11.6 x 5.6 x 3.8 cm = volume: 128.80 mL. Echogenicity within normal limits. No mass, stones or hydronephrosis visualized. Bladder: Contracted and not well seen. Other: Heterogeneous liver with diffuse metastases. Minimal pelvic ascites. IMPRESSION: 1. No evidence of hydronephrosis. Sonographically unremarkable kidneys. 2. Heterogeneous liver with diffuse metastases. 3. Minimal pelvic ascites. Electronically Signed   By: Almira Bar M.D.   On: 11/03/2023 01:43   CT CHEST WO CONTRAST Result Date: 11/02/2023 CLINICAL DATA:  Evaluate chest for metastases.  Rectal mass. EXAM: CT CHEST WITHOUT CONTRAST TECHNIQUE: Multidetector CT imaging of the chest was performed following the standard protocol without IV contrast. RADIATION DOSE REDUCTION: This exam was performed according to the departmental dose-optimization program which includes automated exposure control, adjustment of the mA and/or kV according to patient size and/or use of iterative reconstruction technique. COMPARISON:  None Available. FINDINGS: Cardiovascular: Heart is normal size. Aorta is  normal caliber. Diffuse coronary artery calcifications throughout the left coronary arteries and scattered calcifications throughout the right coronary artery. Scattered aortic atherosclerosis. Mediastinum/Nodes: No mediastinal, hilar, or axillary adenopathy. Trachea and esophagus are unremarkable. Thyroid unremarkable. Lungs/Pleura: Numerous bilateral pulmonary nodules compatible with metastases. The largest on the right is in the posteromedial right lower lobe on image 92 measuring 1.5 cm. Largest on the left is in the left lower lobe on image 86 measuring 1.1 cm. No effusions. Upper Abdomen: Heterogeneous appearance of the liver compatible with metastases as seen on prior abdominal CT. Musculoskeletal: Chest wall soft tissues are unremarkable. No acute bony abnormality or focal bone lesion. IMPRESSION: Numerous bilateral pulmonary nodules with largest nodule measured above. Findings compatible with metastases. Innumerable hepatic metastases better seen on prior abdominal CT. Extensive coronary artery disease. Aortic Atherosclerosis (ICD10-I70.0). Electronically Signed   By: Charlett Nose M.D.   On: 11/02/2023 21:39        Scheduled Meds:  dronabinol  5 mg Oral BID AC   enoxaparin (LOVENOX) injection  30 mg Subcutaneous Q24H   feeding supplement  237 mL Oral BID BM   multivitamin with minerals  1 tablet Oral Daily   Continuous Infusions:  lactated ringers 50 mL/hr at 11/03/23 1207          Glade Lloyd, MD [facility] 11/04/2023, 6:28 AM

## 2023-11-04 NOTE — TOC Progression Note (Addendum)
 Transition of Care [facility]) - Progression Note    Patient Details  Name: Kim Underwood MRN: [id number] Date of Birth: [birth year]  [facility] CM/SW Contact  Leone Haven, RN Phone Number: 11/04/2023, 10:39 AM  Clinical Narrative:    NCM spoke with patient at bedside, per consult,  offered choice for home hospice, she states she would like to wait for her friend Shawnie Dapper to decide and she gave this NCM permission to call him and offer choice for home hospice, she states his phone is [phone].   Expected Discharge Plan: Home/Self Care Barriers to Discharge: No Barriers Identified  Expected Discharge Plan and Services In-house Referral: NA Discharge Planning Services: CM Consult Post Acute Care Choice: NA Living arrangements for the past 2 months: Single Family Home                 DME Arranged: N/A DME Agency: NA       HH Arranged: NA           Social Determinants of Health (SDOH) Interventions SDOH Screenings   Food Insecurity: No Food Insecurity (10/30/2023)  Housing: Low Risk  (10/30/2023)  Transportation Needs: No Transportation Needs (10/30/2023)  Utilities: Not At Risk (10/30/2023)  Tobacco Use: High Risk (10/30/2023)    Readmission Risk Interventions    11/01/2023   11:38 AM  Readmission Risk Prevention Plan  Transportation Screening Complete  PCP or Specialist Appt within 5-7 Days Complete  Home Care Screening Complete  Medication Review (RN CM) Complete

## 2023-11-04 NOTE — TOC Progression Note (Addendum)
 Transition of Care [facility]) - Progression Note    Patient Details  Name: Kim Underwood MRN: [id number] Date of Birth: 10/19/1965  [facility]) CM/SW Contact  Leone Haven, RN Phone Number: 11/04/2023, 1:42 PM  Clinical Narrative:    [name] is here in the room with patient, they have decided on [facility] home hospice.  NCM made referral to [facility] with [facility].  Marcial Pacas is asking if he can also get an aide, he states he is not going to be there 37 /7 with patient, then he asked if she could do residential hospice, he works.  NCM asking MD .  Ines Bloomer with [facility] will speak to him about his options.    Expected Discharge Plan: Home/Self Care Barriers to Discharge: No Barriers Identified  Expected Discharge Plan and Services In-house Referral: NA Discharge Planning Services: CM Consult Post Acute Care Choice: NA Living arrangements for the past 2 months: Single Family Home                 DME Arranged: N/A DME Agency: NA       HH Arranged: NA           Social Determinants of Health (SDOH) Interventions SDOH Screenings   Food Insecurity: No Food Insecurity (10/30/2023)  Housing: Low Risk  (10/30/2023)  Transportation Needs: No Transportation Needs (10/30/2023)  Utilities: Not At Risk (10/30/2023)  Tobacco Use: High Risk (10/30/2023)    Readmission Risk Interventions    11/01/2023   11:38 AM  Readmission Risk Prevention Plan  Transportation Screening Complete  PCP or Specialist Appt within 5-7 Days Complete  Home Care Screening Complete  Medication Review (RN CM) Complete

## 2023-11-04 NOTE — Progress Notes (Signed)
 [facility] night cross cover note:  Morning potassium level initially reported to be 6.2 (was 5.1 yesterday AM). Recheck of BMP is currently pending.     [name], DO Hospitalist

## 2023-11-04 NOTE — TOC Transition Note (Deleted)
 Transition of Care [facility]) - Discharge Note   Patient Details  Name: Kim Underwood MRN: [id number] Date of Birth: [birth year]  Transition [facility] Care [facility]) CM/SW Contact:  Leone Haven, RN Phone Number: 11/04/2023, 10:44 AM   Clinical Narrative:    For dc home today, grand daughter is at the bedside to transport her home.  She has no other needs.    Final next level of care: Home/Self Care Barriers to Discharge: No Barriers Identified   Patient Goals and CMS Choice Patient states their goals for this hospitalization and ongoing recovery are:: return home   Choice offered to / list presented to : NA      Discharge Placement                       Discharge Plan and Services Additional resources added to the After Visit Summary for   In-house Referral: NA Discharge Planning Services: CM Consult Post Acute Care Choice: NA          DME Arranged: N/A DME Agency: NA       HH Arranged: NA          Social Drivers of Health (SDOH) Interventions SDOH Screenings   Food Insecurity: No Food Insecurity (10/30/2023)  Housing: Low Risk  (10/30/2023)  Transportation Needs: No Transportation Needs (10/30/2023)  Utilities: Not At Risk (10/30/2023)  Tobacco Use: High Risk (10/30/2023)     Readmission Risk Interventions    11/01/2023   11:38 AM  Readmission Risk Prevention Plan  Transportation Screening Complete  PCP or Specialist Appt within 5-7 Days Complete  Home Care Screening Complete  Medication Review (RN CM) Complete

## 2023-11-04 NOTE — Progress Notes (Signed)
 Araeya Lamb   DOB:June 10, 1966   UJ#:[id number]    ASSESSMENT & PLAN:  58 year old female with history of HTN, tobacco use, NICM and never had colonoscopy presented with newly found diffuse liver lesions, pulmonary lesions, primary rectal tumor. 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 Clinically with diffuse liver metastases, distention, progressive liver dysfunction, renal failure. We discussed the pathology today. Unfortunately this is not curable. Prognosis is poor with diffuse liver metastases and causing impending liver failure. She  is presenting with rapid end organ damage with worsening liver function and likely will result in liver failure in the coming weeks. Chemotherapy is not suitable in this scenario. We discussed palliative care and hospice consult with focus on comfort measures, symptoms control.   Goals of care Recommend hospice consult  Discharge planning Recommend hospice consult  All questions were answered.    Communicated with patient's significant other Tim at her request and primary team.  Thank you for the consult. Will sign off. Please feel free to call if any questions.  Melven Sartorius, MD 11/04/2023 7:48 AM  Subjective:  Viktoria reports feeling a little better. Nausea and vomiting under controlled better. No diarrhea. Some constipation. She denies seeing blood in the stool. No blood in the urine. She has early satiety.  Objective:  Vitals:   11/04/23 0034 11/04/23 0430  BP: 105/69 110/68  Pulse: 86 86  Resp: (!) 22 20  Temp: 98.2 F (36.8 C) 98.2 F (36.8 C)  SpO2: 100% 96%     Intake/Output Summary (Last 24 hours) at 11/04/2023 0748 Last data filed at 11/04/2023 0500 Gross per 24 hour  Intake 389.4 ml  Output 300 ml  Net 89.4 ml    GENERAL: alert, no distress and comfortable SKIN: skin color normal LUNGS:  normal breathing effort.  ABDOMEN: abdomen non-tender but distended. Palpable liver  masses Musculoskeletal: no lower extremity edema NEURO: alert with fluent speech, answering questions appropriately   Labs:  Recent Labs    09/29/23 0838 09/29/23 0838 10/30/23 1041 10/31/23 0258 11/01/23 0330 11/02/23 1012 11/03/23 0228  NA  --    < > 135 135 130* 128* 131*  K  --    < > 4.1 4.3 4.3 5.2* 5.1  CL  --    < > 100 99 97* 98 97*  CO2  --    < > 20* 21* 19* 16* 19*  GLUCOSE  --    < > 131* 94 102* 108* 100*  BUN  --    < > 42* 50* 54* 78* 81*  CREATININE  --    < > 1.41* 1.73* 2.20* 3.52* 3.44*  CALCIUM  --    < > 8.7* 8.3* 8.2* 7.9* 8.3*  GFRNONAA  --    < > 43* 34* 25* 14* 15*  PROT 7.0  --  6.6 6.3* 6.3*  --  6.2*  ALBUMIN  --    < > 2.7* 2.5* 2.5*  --  2.5*  AST 33  --  355* 423* 422*  --  926*  ALT 30*  --  232* 268* 293*  --  505*  ALKPHOS  --    < > 663* 585* 521*  --  510*  BILITOT 0.4  --  10.9* 10.2* 11.1*  --  14.7*  BILIDIR 0.1  --  6.7*  --   --   --  8.8*  IBILI 0.3  --   --   --   --   --  5.9*   < > = values in this interval not displayed.    Studies:  US BIOPSY (LIVER) Result Date: 11/03/2023 INDICATION: Liver metastases EXAM: ULTRASOUND CORE BIOPSY LEFT LIVER METASTASIS MEDICATIONS: 1% LIDOCAINE LOCAL ANESTHESIA/SEDATION: Moderate (conscious) sedation was employed during this procedure. A total of Versed 0.5 mg and Fentanyl 37.5 mcg was administered intravenously by the radiology nurse. Total intra-service moderate Sedation Time: 15 minutes. The patient's level of consciousness and vital signs were monitored continuously by radiology nursing throughout the procedure under my direct supervision. COMPLICATIONS: None immediate. PROCEDURE: Informed written consent was obtained from the patient after a thorough discussion of the procedural risks, benefits and alternatives. All questions were addressed. Maximal Sterile Barrier Technique was utilized including caps, mask, sterile gowns, sterile gloves, sterile drape, hand hygiene and skin antiseptic. A  timeout was performed prior to the initiation of the procedure. Previous imaging reviewed. Preliminary ultrasound performed. A left hepatic lesion was localized anteriorly and marked in the subxiphoid area. Under sterile conditions and local anesthesia, a 17 gauge guide needle was advanced into the lesion. Needle position confirmed with ultrasound. 18 gauge core biopsies obtained. Samples were intact and non fragmented. These were placed in formalin. Images obtained for documentation. Needle tract occluded with Gel-Foam. Postprocedure imaging demonstrates no hemorrhage or hematoma. Patient tolerated the biopsy well. IMPRESSION: Successful ultrasound left hepatic lesion 18 gauge core biopsy Electronically Signed   By: Judie Petit.  Shick M.D.   On: 11/03/2023 12:05   US RENAL Result Date: 11/03/2023 CLINICAL DATA:  Metastatic colorectal cancer discovered on CT abdomen and pelvis 10/30/2023, presents with acute kidney injury. [id number]. EXAM: RENAL / URINARY TRACT ULTRASOUND COMPLETE COMPARISON:  CT with IV contrast 10/30/2023. FINDINGS: Right Kidney: Renal measurements: 12.5 x 4.3 x 5.8 cm = volume: 164.86 mL. Echogenicity within normal limits. No mass, stones or hydronephrosis visualized. Left Kidney: Renal measurements: 11.6 x 5.6 x 3.8 cm = volume: 128.80 mL. Echogenicity within normal limits. No mass, stones or hydronephrosis visualized. Bladder: Contracted and not well seen. Other: Heterogeneous liver with diffuse metastases. Minimal pelvic ascites. IMPRESSION: 1. No evidence of hydronephrosis. Sonographically unremarkable kidneys. 2. Heterogeneous liver with diffuse metastases. 3. Minimal pelvic ascites. Electronically Signed   By: Almira Bar M.D.   On: 11/03/2023 01:43   CT CHEST WO CONTRAST Result Date: 11/02/2023 CLINICAL DATA:  Evaluate chest for metastases.  Rectal mass. EXAM: CT CHEST WITHOUT CONTRAST TECHNIQUE: Multidetector CT imaging of the chest was performed following the standard protocol without IV  contrast. RADIATION DOSE REDUCTION: This exam was performed according to the departmental dose-optimization program which includes automated exposure control, adjustment of the mA and/or kV according to patient size and/or use of iterative reconstruction technique. COMPARISON:  None Available. FINDINGS: Cardiovascular: Heart is normal size. Aorta is normal caliber. Diffuse coronary artery calcifications throughout the left coronary arteries and scattered calcifications throughout the right coronary artery. Scattered aortic atherosclerosis. Mediastinum/Nodes: No mediastinal, hilar, or axillary adenopathy. Trachea and esophagus are unremarkable. Thyroid unremarkable. Lungs/Pleura: Numerous bilateral pulmonary nodules compatible with metastases. The largest on the right is in the posteromedial right lower lobe on image 92 measuring 1.5 cm. Largest on the left is in the left lower lobe on image 86 measuring 1.1 cm. No effusions. Upper Abdomen: Heterogeneous appearance of the liver compatible with metastases as seen on prior abdominal CT. Musculoskeletal: Chest wall soft tissues are unremarkable. No acute bony abnormality or focal bone lesion. IMPRESSION: Numerous bilateral pulmonary nodules with largest nodule measured above. Findings compatible with metastases.  Innumerable hepatic metastases better seen on prior abdominal CT. Extensive coronary artery disease. Aortic Atherosclerosis (ICD10-I70.0). Electronically Signed   By: Charlett Nose M.D.   On: 11/02/2023 21:39   ECHOCARDIOGRAM COMPLETE Result Date: 10/31/2023    ECHOCARDIOGRAM REPORT   Patient Name:   GLADIE GRAVETTE Elks Date of Exam: 10/31/2023 Medical Rec #:  [id number]            Height:       60.0 in Accession #:    [id number]           Weight:       140.4 lb Date of Birth:  01/01/1966             BSA:          1.606 m Patient Age:    57 years             BP:           103/64 mmHg Patient Gender: F                    HR:           82 bpm. Exam Location:   Inpatient Procedure: 2D Echo, Cardiac Doppler, Color Doppler and Strain Analysis (Both            Spectral and Color Flow Doppler were utilized during procedure). Indications:    Abnormal ECG 794.31 / R94.31  History:        Patient has prior history of Echocardiogram examinations, most                 recent 11/26/2020. CHF; Risk Factors:Hypertension and Current                 Smoker.  Sonographer:    Lucendia Herrlich RCS Referring Phys: [id number] BINAYA DAHAL IMPRESSIONS  1. Left ventricular ejection fraction, by estimation, is 60 to 65%. The left ventricle has normal function. The left ventricle has no regional wall motion abnormalities. Left ventricular diastolic parameters are indeterminate.  2. Right ventricular systolic function is normal. The right ventricular size is normal.  3. The mitral valve is normal in structure. Trivial mitral valve regurgitation. No evidence of mitral stenosis.  4. Tricuspid valve regurgitation is mild to moderate.  5. The aortic valve is normal in structure. Aortic valve regurgitation is not visualized. No aortic stenosis is present.  6. The inferior vena cava is normal in size with greater than 50% respiratory variability, suggesting right atrial pressure of 3 mmHg. FINDINGS  Left Ventricle: Left ventricular ejection fraction, by estimation, is 60 to 65%. The left ventricle has normal function. The left ventricle has no regional wall motion abnormalities. Global longitudinal strain performed but not reported based on interpreter judgement due to suboptimal tracking. The left ventricular internal cavity size was normal in size. There is no left ventricular hypertrophy. Left ventricular diastolic parameters are indeterminate. Right Ventricle: The right ventricular size is normal. No increase in right ventricular wall thickness. Right ventricular systolic function is normal. Left Atrium: Left atrial size was normal in size. Right Atrium: Right atrial size was normal in size.  Pericardium: There is no evidence of pericardial effusion. Mitral Valve: The mitral valve is normal in structure. Trivial mitral valve regurgitation. No evidence of mitral valve stenosis. Tricuspid Valve: The tricuspid valve is normal in structure. Tricuspid valve regurgitation is mild to moderate. No evidence of tricuspid stenosis. Aortic Valve: The aortic valve is normal in structure. Aortic valve regurgitation  is not visualized. No aortic stenosis is present. Aortic valve peak gradient measures 11.3 mmHg. Pulmonic Valve: The pulmonic valve was normal in structure. Pulmonic valve regurgitation is mild. No evidence of pulmonic stenosis. Aorta: The aortic root is normal in size and structure. Venous: The inferior vena cava is normal in size with greater than 50% respiratory variability, suggesting right atrial pressure of 3 mmHg. IAS/Shunts: No atrial level shunt detected by color flow Doppler.  LEFT VENTRICLE PLAX 2D LVIDd:         4.30 cm   Diastology LVIDs:         2.90 cm   LV e' medial:    6.42 cm/s LV PW:         0.90 cm   LV E/e' medial:  7.9 LV IVS:        0.90 cm   LV e' lateral:   7.94 cm/s LVOT diam:     2.10 cm   LV E/e' lateral: 6.4 LV SV:         62 LV SV Index:   38 LVOT Area:     3.46 cm  RIGHT VENTRICLE             IVC RV S prime:     17.20 cm/s  IVC diam: 1.10 cm TAPSE (M-mode): 2.4 cm LEFT ATRIUM             Index        RIGHT ATRIUM          Index LA diam:        3.90 cm 2.43 cm/m   RA Area:     9.22 cm LA Vol (A2C):   35.9 ml 22.35 ml/m  RA Volume:   15.00 ml 9.34 ml/m LA Vol (A4C):   52.9 ml 32.94 ml/m LA Biplane Vol: 43.4 ml 27.02 ml/m  AORTIC VALVE AV Area (Vmax): 2.21 cm AV Vmax:        168.00 cm/s AV Peak Grad:   11.3 mmHg LVOT Vmax:      107.00 cm/s LVOT Vmean:     70.600 cm/s LVOT VTI:       0.178 m  AORTA Ao Root diam: 3.40 cm Ao Asc diam:  3.40 cm MITRAL VALVE                TRICUSPID VALVE MV Area (PHT): 4.31 cm     TR Peak grad:   30.9 mmHg MV Decel Time: 176 msec     TR  Vmax:        278.00 cm/s MR Peak grad: 29.4 mmHg MR Vmax:      271.00 cm/s   SHUNTS MV E velocity: 50.60 cm/s   Systemic VTI:  0.18 m MV A velocity: 100.00 cm/s  Systemic Diam: 2.10 cm MV E/A ratio:  0.51 Kardie Tobb DO Electronically signed by Thomasene Ripple DO Signature Date/Time: 10/31/2023/3:26:54 PM    Final    CT ABDOMEN PELVIS W CONTRAST Result Date: 10/30/2023 CLINICAL DATA:  Abdominal pain and bloating.  Abnormal ultrasound EXAM: CT ABDOMEN AND PELVIS WITH CONTRAST TECHNIQUE: Multidetector CT imaging of the abdomen and pelvis was performed using the standard protocol following bolus administration of intravenous contrast. RADIATION DOSE REDUCTION: This exam was performed according to the departmental dose-optimization program which includes automated exposure control, adjustment of the mA and/or kV according to patient size and/or use of iterative reconstruction technique. CONTRAST:  75mL OMNIPAQUE IOHEXOL 350 MG/ML SOLN COMPARISON:  Ultrasound same day FINDINGS: Lower  chest: Bilateral round lower lobe pulmonary nodules. Nodules of varying size. Nodule in RIGHT lower lobe medially along the pleural surface measures 14 mm (image 12). Nodule LEFT lower lobe measures 5 mm on image 7. Proximally 3-6 nodules in each lung base. Hepatobiliary: Innumerable hypoenhancing lesions within LEFT and RIGHT hepatic lobe. The liver is expanded by this near confluent pattern. Largest lesion in the RIGHT hepatic lobe measures 6.5 cm (image 14/3). Small example lesion in the LEFT hepatic lobe measures 2 cm image 18/3. These lesions are too numerous to count range in size from 10 mm to 65 mm. Gallbladder collapsed Pancreas: Pancreas is normal. No ductal dilatation. No pancreatic inflammation. Spleen: Normal spleen Adrenals/urinary tract: Adrenal glands and kidneys are normal. The ureters and bladder normal. Stomach/Bowel: Stomach, duodenum small-bowel normal. Appendix not identified. Ascending and transverse colon normal.  Descending colon normal. There is asymmetric thickening in the mid and distal rectum along the LEFT wall of the rectum measuring 3.2 cm (image 64/3). Bulky endoluminal rectal mass is well seen on coronal imaging measuring 4.8 cm (image 26/6). enlarged lymph nodes in the LEFT mesorectum measuring 10-12 mm on image 64/3. Exophytic extension from the rectum to the presacral space measuring 3.7 cm on image 60/3 Vascular/Lymphatic: No upper abdominal adenopathy. Axial adenopathy described above. Reproductive: Post hysterectomy anatomy. There is irregularity in the upper aspect of the vagina/vaginal cuff (image 63/3) Other: No free fluid. Musculoskeletal: No aggressive osseous lesion. IMPRESSION: 1. Bulky rectal mass with extension to the presacral space most consistent with primary rectal adenocarcinoma. No evidence of bowel obstruction. 2. Local nodal metastasis to the mesorectum. 3. Potential local extension to upper vagina.  Post hysterectomy. 4. Innumerable HEPATIC METASTASIS. 5. Bilateral lower lobe pulmonary nodules consistent with PULMONARY METASTASIS. Findings conveyed toKommer, MD on 10/30/2023  at14:03. Electronically Signed   By: Genevive Bi M.D.   On: 10/30/2023 14:03   US Abdomen Limited RUQ (LIVER/GB) Result Date: 10/30/2023 CLINICAL DATA:  [id number] Transaminitis [id number] EXAM: ULTRASOUND ABDOMEN LIMITED RIGHT UPPER QUADRANT COMPARISON:  None Available. FINDINGS: Gallbladder: Partially contracted. No gallstones or wall thickening visualized. No sonographic Murphy sign noted by sonographer. Common bile duct: Diameter: 6 mm Liver: Diffusely heterogeneous appearance of the liver with coarsened echotexture and generally increased parenchymal echogenicity. Possible solid mass in the caudate lobe measuring 4.5 cm. Nodular hepatic surface contour. Portal vein is patent on color Doppler imaging with normal direction of blood flow towards the liver. Other: None. IMPRESSION: 1. Cirrhotic morphology of the liver  with possible solid mass in the caudate lobe measuring 4.5 cm. Further evaluation with contrast-enhanced MRI of the abdomen is recommended. 2. No evidence of cholelithiasis or acute cholecystitis. Electronically Signed   By: Duanne Guess D.O.   On: 10/30/2023 13:16   DG Chest 2 View Result Date: 10/30/2023 CLINICAL DATA:  Shortness of breath EXAM: CHEST - 2 VIEW COMPARISON:  09/03/2015 FINDINGS: The heart size and mediastinal contours are within normal limits. No focal airspace consolidation, pleural effusion, or pneumothorax. The visualized skeletal structures are unremarkable. IMPRESSION: No active cardiopulmonary disease. Electronically Signed   By: Duanne Guess D.O.   On: 10/30/2023 12:00   DG Abd 1 View Result Date: 10/26/2023 CLINICAL DATA:  Abdominal bloating. EXAM: ABDOMEN - 1 VIEW COMPARISON:  Radiograph dated 09/09/2009. FINDINGS: Mild colonic stool burden. No bowel dilatation or evidence of obstruction. No free air. The osseous structures are intact. The soft tissues are unremarkable IMPRESSION: Nonobstructive bowel gas pattern. Electronically Signed   By: Burtis Junes  Radparvar M.D.   On: 10/26/2023 15:36   US THYROID Result Date: 10/13/2023 CLINICAL DATA:  Hyperthyroid. EXAM: THYROID ULTRASOUND TECHNIQUE: Ultrasound examination of the thyroid gland and adjacent soft tissues was performed. COMPARISON:  None Available. FINDINGS: Parenchymal Echotexture: Moderately heterogeneous Isthmus: 0.5 cm Right lobe: 4.2 x 1.6 x 1.8 cm Left lobe: 5.0 x 3.0 x 2.3 cm ________________________________________________________ Estimated total number of nodules >/= 1 cm: 4 Number of spongiform nodules >/=  2 cm not described below (TR1): 0 Number of mixed cystic and solid nodules >/= 1.5 cm not described below (TR2): 0 _________________________________________________________ Nodule # 1: Location: Isthmus; Superior Maximum size: 0.6 cm; Other 2 dimensions: 0.4 x 0.5 cm Composition: spongiform (0) Echogenicity:  hypoechoic (2) Shape: not taller-than-wide (0) Margins: smooth (0) Echogenic foci: none (0) ACR TI-RADS total points: 2. ACR TI-RADS risk category: TR2 (2 points). ACR TI-RADS recommendations: This nodule does NOT meet TI-RADS criteria for biopsy or dedicated follow-up. _________________________________________________________ Nodule # 2: Location: Right; Superior Maximum size: 1.1 cm; Other 2 dimensions: 1.1 x 0.9 cm Composition: mixed cystic and solid (1) Echogenicity: isoechoic (1) Shape: not taller-than-wide (0) Margins: smooth (0) Echogenic foci: macrocalcifications (1) ACR TI-RADS total points: 3. ACR TI-RADS risk category: TR3 (3 points). ACR TI-RADS recommendations: Given size (<1.4 cm) and appearance, this nodule does NOT meet TI-RADS criteria for biopsy or dedicated follow-up. _________________________________________________________ Nodule # 3: Location: Right; Mid Maximum size: 0.7 cm; Other 2 dimensions: 0.6 x 0.6 cm Composition: mixed cystic and solid (1) Echogenicity: isoechoic (1) Shape: not taller-than-wide (0) Margins: smooth (0) Echogenic foci: none (0) ACR TI-RADS total points: 2. ACR TI-RADS risk category: TR2 (2 points). ACR TI-RADS recommendations: This nodule does NOT meet TI-RADS criteria for biopsy or dedicated follow-up. _________________________________________________________ Nodule # 4: Location: Right; Inferior Maximum size: 1.0 cm; Other 2 dimensions: 1.0 x 0.7 cm Composition: mixed cystic and solid (1) Echogenicity: isoechoic (1) Shape: not taller-than-wide (0) Margins: smooth (0) Echogenic foci: none (0) ACR TI-RADS total points: 2. ACR TI-RADS risk category: TR2 (2 points). ACR TI-RADS recommendations: This nodule does NOT meet TI-RADS criteria for biopsy or dedicated follow-up. _________________________________________________________ Nodule # 5: Location: Left; Superior Maximum size: 2.0 cm; Other 2 dimensions: 1.8 x 1.4 cm Composition: mixed cystic and solid (1) Echogenicity:  isoechoic (1) Shape: not taller-than-wide (0) Margins: smooth (0) Echogenic foci: none (0) ACR TI-RADS total points: 2. ACR TI-RADS risk category: TR2 (2 points). ACR TI-RADS recommendations: This nodule does NOT meet TI-RADS criteria for biopsy or dedicated follow-up. _________________________________________________________ Nodule # 6: Location: Left; Mid Maximum size: 0.8 cm; Other 2 dimensions: 0.8 x 0.6 cm Composition: spongiform (0) Echogenicity: hypoechoic (2) Shape: not taller-than-wide (0) Margins: smooth (0) Echogenic foci: none (0) ACR TI-RADS total points: 2. ACR TI-RADS risk category: TR2 (2 points). ACR TI-RADS recommendations: This nodule does NOT meet TI-RADS criteria for biopsy or dedicated follow-up. _________________________________________________________ Pseudo nodularity about the left inferior thyroid without discrete margins. No cervical lymphadenopathy. IMPRESSION: 1. Multinodular thyroid. 2. Each of the visualized bilateral thyroid nodules appears benign and does not warrant additional follow-up. The above is in keeping with the ACR TI-RADS recommendations - J Am Coll Radiol 2017;14:587-595. Marliss Coots, MD [facility] Radiology Electronically Signed   By: Marliss Coots M.D.   On: 10/13/2023 11:06

## 2023-11-04 NOTE — Progress Notes (Signed)
 [facility] night cross cover note:   I was notified by RN that the patient has become more lethargic and cbg was noted to be low at 56. She has been exhibiting recent decline in po intake and has refused food/juice this evening after identification of the hypoglycemia.  I have ordered an amp of D50 to be administered x 1 now and I have added an order for prn amps of D50 for CBG less than 70 moving forward.  I have also ordered a repeat CBG to be checked in approximately 45 minutes.     [name], DO Hospitalist

## 2023-11-04 NOTE — Plan of Care (Signed)
  Problem: Health Behavior/Discharge Planning: Goal: Ability to manage health-related needs will improve Outcome: Not Progressing   Problem: Clinical Measurements: Goal: Ability to maintain clinical measurements within normal limits will improve Outcome: Not Progressing   Problem: Clinical Measurements: Goal: Diagnostic test results will improve Outcome: Not Progressing   Problem: Activity: Goal: Risk for activity intolerance will decrease Outcome: Not Progressing   Problem: Nutrition: Goal: Adequate nutrition will be maintained Outcome: Not Progressing   Problem: Safety: Goal: Ability to remain free from injury will improve Outcome: Progressing

## 2023-11-04 NOTE — Progress Notes (Signed)
 Kim Underwood is an 58 y.o. female  w/ HTN,  NICM with ejection fraction of 15 to 20% back in 2014 which has recovered, tobacco use presenting with a 7 to 10-day history of abdominal fullness, bloating, nausea, anorexia; CT with contrast showed a rectal mass w/ numerous hepatic metastases and nodules in the lower lobes of the lung bilaterally.  Colonoscopy showed a fungating nonobstructing large mass in the rectum close to the anal verge as well as multiple sessile polyps in the descending colon.    Assessment/Plan: Acute renal failure secondary to contrast nephropathy context of relative hypotension with systolics in the 90s to 100s she is significant given that she is on multiple antihypertensives at home. PVR 0; and renal ultrasound no e/o hydro -FeNA <1% but that can be consistent with CIN as well.  -Unfortunately oncology do not think she is very candidate for chemotherapy given poor prognosis, diffuse liver metastasis, already liver involvement as well as function, pulmonary mets as well.  Would not offer dialysis in this setting, discussed with the patient and she is in agreement.  Patient is being discharged on home hospice.  -Monitor  Daily weight  -Maintain MAP>65 for optimal renal perfusion.  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  Status post CT chest. H/o NICM history of ejection fraction 15 to 20% in 2014 which has recovered to 50% in 2022 and 60 to 65% this hospitalization followed by Dr. Shirlee Latch.  She is certainly compensated. History of hypertension and antihypertensives are currently on hold  Subjective: Denies any further n/v but appetite not great.  Shortness of breath present multiple.   Chemistry and CBC: Creat  Date/Time Value Ref Range Status  12/12/2012 01:58 PM 0.72 0.50 - 1.10 mg/dL Final   Creatinine,  Ser  Date/Time Value Ref Range Status  11/04/2023 08:58 AM 4.60 (H) 0.44 - 1.00 mg/dL Final  16/05/9603 54:09 AM 3.44 (H) 0.44 - 1.00 mg/dL Final  81/19/1478 29:56 AM 3.52 (H) 0.44 - 1.00 mg/dL Final    Comment:    DELTA CHECK NOTED  11/01/2023 03:30 AM 2.20 (H) 0.44 - 1.00 mg/dL Final  21/30/8657 84:69 AM 1.73 (H) 0.44 - 1.00 mg/dL Final  62/95/2841 32:44 AM 1.41 (H) 0.44 - 1.00 mg/dL Final  08/19/7251 66:44 AM 0.76 0.44 - 1.00 mg/dL Final  03/47/4259 56:38 PM 0.79 0.44 - 1.00 mg/dL Final  75/64/3329 51:88 AM 0.69 0.44 - 1.00 mg/dL Final  41/66/0630 16:01 AM 0.75 0.44 - 1.00 mg/dL Final  09/32/3557 32:20 AM 0.67 0.44 - 1.00 mg/dL Final  25/42/7062 37:62 AM 0.68 0.44 - 1.00 mg/dL Final  83/15/1761 60:73 AM 0.71 0.44 - 1.00 mg/dL Final  71/01/2693 85:46 AM 0.66 0.44 - 1.00 mg/dL Final  27/10/5007 38:18 AM 0.71 0.44 - 1.00 mg/dL Final  29/93/7169 67:89 PM 0.73 0.44 - 1.00 mg/dL Final  38/05/1750 02:58 AM 0.69 0.44 - 1.00 mg/dL Final  52/77/8242 35:36 PM 0.71 0.44 - 1.00 mg/dL Final  14/43/1540 08:67 AM 0.62 0.50 - 1.10 mg/dL Final  61/95/0932 67:12 PM 0.57 0.50 - 1.10 mg/dL Final  45/80/9983 38:25 AM 0.6 0.4 - 1.2 mg/dL Final  05/39/7673 41:93 AM 0.7 0.4 - 1.2 mg/dL Final  79/09/4095 35:32 AM 0.59 0.50 - 1.10 mg/dL Final  99/24/2683 41:96 AM 0.82 0.50 - 1.10 mg/dL Final  22/29/7989 21:19 PM 0.72 0.50 - 1.10  mg/dL Final  78/29/5621 30:86 AM 0.9 0.4 - 1.2 mg/dL Final  57/84/6962 95:28 AM 0.8 0.4 - 1.2 mg/dL Final  41/32/4401 02:72 AM 0.8 0.4 - 1.2 mg/dL Final   Recent Labs  Lab 10/30/23 1041 10/31/23 0258 11/01/23 0330 11/02/23 1012 11/03/23 0228 11/04/23 0858  NA 135 135 130* 128* 131* 132*  K 4.1 4.3 4.3 5.2* 5.1 6.4*  CL 100 99 97* 98 97* 98  CO2 20* 21* 19* 16* 19* 16*  GLUCOSE 131* 94 102* 108* 100* 77  BUN 42* 50* 54* 78* 81* 99*  CREATININE 1.41* 1.73* 2.20* 3.52* 3.44* 4.60*  CALCIUM 8.7* 8.3* 8.2* 7.9* 8.3* 8.5*   Recent Labs  Lab 10/30/23 1041 10/31/23 0258  11/01/23 0330 11/02/23 1012 11/03/23 0228 11/04/23 0230  WBC 15.2*   < > 14.9* 14.5* 15.6* 16.3*  NEUTROABS 11.8*  --  11.3*  --   --   --   HGB 10.6*   < > 10.0* 10.4* 9.9* 9.8*  HCT 30.8*   < > 28.0* 29.5* 27.7* 28.5*  MCV 88.3   < > 85.9 87.8 86.0 88.2  PLT 536*   < > 454* 453* 452* 433*   < > = values in this interval not displayed.   Liver Function Tests: Recent Labs  Lab 11/01/23 0330 11/03/23 0228 11/04/23 0858  AST 422* 926* 1,785*  ALT 293* 505* 825*  ALKPHOS 521* 510* 574*  BILITOT 11.1* 14.7* 17.2*  PROT 6.3* 6.2* 6.3*  ALBUMIN 2.5* 2.5* 2.4*   Recent Labs  Lab 10/30/23 1041  LIPASE 109*   No results for input(s): "AMMONIA" in the last 168 hours. Cardiac Enzymes: No results for input(s): "CKTOTAL", "CKMB", "CKMBINDEX", "TROPONINI" in the last 168 hours. Iron Studies: No results for input(s): "IRON", "TIBC", "TRANSFERRIN", "FERRITIN" in the last 72 hours. PT/INR: @LABRCNTIP (inr:5)  Xrays/Other Studies: ) Results for orders placed or performed during the hospital encounter of 10/30/23 (from the past 48 hours)  Basic metabolic panel     Status: Abnormal   Collection Time: 11/03/23  2:28 AM  Result Value Ref Range   Sodium 131 (L) 135 - 145 mmol/L   Potassium 5.1 3.5 - 5.1 mmol/L   Chloride 97 (L) 98 - 111 mmol/L   CO2 19 (L) 22 - 32 mmol/L   Glucose, Bld 100 (H) 70 - 99 mg/dL    Comment: Glucose reference range applies only to samples taken after fasting for at least 8 hours.   BUN 81 (H) 6 - 20 mg/dL   Creatinine, Ser 5.36 (H) 0.44 - 1.00 mg/dL   Calcium 8.3 (L) 8.9 - 10.3 mg/dL   GFR, Estimated 15 (L) >60 mL/min    Comment: (NOTE) Calculated using the CKD-EPI Creatinine Equation (2021)    Anion gap 15 5 - 15    Comment: Performed at [facility], [address], [place], [place] [address]  CBC     Status: Abnormal   Collection Time: 11/03/23  2:28 AM  Result Value Ref Range   WBC 15.6 (H) 4.0 - 10.5 K/uL   RBC 3.22 (L) 3.87 - 5.11 MIL/uL    Hemoglobin 9.9 (L) 12.0 - 15.0 g/dL   HCT 47.4 (L) 25.9 - 56.3 %   MCV 86.0 80.0 - 100.0 fL   MCH 30.7 26.0 - 34.0 pg   MCHC 35.7 30.0 - 36.0 g/dL   RDW 87.5 (H) 64.3 - 32.9 %   Platelets 452 (H) 150 - 400 K/uL   nRBC  0.0 0.0 - 0.2 %    Comment: Performed at [facility], [address] [place], [place] [address]  Lactate dehydrogenase     Status: Abnormal   Collection Time: 11/03/23  2:28 AM  Result Value Ref Range   LDH 1,671 (H) 98 - 192 U/L    Comment: Performed at [facility], [address], [place], [place] [address]  Hepatic function panel     Status: Abnormal   Collection Time: 11/03/23  2:28 AM  Result Value Ref Range   Total Protein 6.2 (L) 6.5 - 8.1 g/dL   Albumin 2.5 (L) 3.5 - 5.0 g/dL   AST 098 (H) 15 - 41 U/L   ALT 505 (H) 0 - 44 U/L   Alkaline Phosphatase 510 (H) 38 - 126 U/L   Total Bilirubin 14.7 (H) 0.0 - 1.2 mg/dL   Bilirubin, Direct 8.8 (H) 0.0 - 0.2 mg/dL   Indirect Bilirubin 5.9 (H) 0.3 - 0.9 mg/dL    Comment: Performed at [facility], [address], [place], [place] [address]  Protime-INR     Status: Abnormal   Collection Time: 11/03/23  8:44 AM  Result Value Ref Range   Prothrombin Time 18.2 (H) 11.4 - 15.2 seconds   INR 1.5 (H) 0.8 - 1.2    Comment: (NOTE) INR goal varies based on device and disease states. Performed at [facility], [address], [place], [place] [address]   CBC     Status: Abnormal   Collection Time: 11/04/23  2:30 AM  Result Value Ref Range   WBC 16.3 (H) 4.0 - 10.5 K/uL   RBC 3.23 (L) 3.87 - 5.11 MIL/uL   Hemoglobin 9.8 (L) 12.0 - 15.0 g/dL   HCT 62.1 (L) 30.8 - 65.7 %   MCV 88.2 80.0 - 100.0 fL   MCH 30.3 26.0 - 34.0 pg   MCHC 34.4 30.0 - 36.0 g/dL   RDW 84.6 (H) 96.2 - 95.2 %   Platelets 433 (H) 150 - 400 K/uL   nRBC 0.0 0.0 - 0.2 %    Comment: Performed at [facility], [address], [place], [place] [address]  Comprehensive metabolic panel     Status: Abnormal   Collection  Time: 11/04/23  8:58 AM  Result Value Ref Range   Sodium 132 (L) 135 - 145 mmol/L   Potassium 6.4 (HH) 3.5 - 5.1 mmol/L    Comment: CRITICAL RESULT CALLED TO, READ BACK BY AND VERIFIED WITH [place] RUPPE RN.@1108  ON 3.20.25 BY TCALDWELL MT.   Chloride 98 98 - 111 mmol/L   CO2 16 (L) 22 - 32 mmol/L   Glucose, Bld 77 70 - 99 mg/dL    Comment: Glucose reference range applies only to samples taken after fasting for at least 8 hours.   BUN 99 (H) 6 - 20 mg/dL   Creatinine, Ser 4.40 (H) 0.44 - 1.00 mg/dL   Calcium 8.5 (L) 8.9 - 10.3 mg/dL   Total Protein 6.3 (L) 6.5 - 8.1 g/dL   Albumin 2.4 (L) 3.5 - 5.0 g/dL   AST 1,027 (H) 15 - 41 U/L   ALT 825 (H) 0 - 44 U/L   Alkaline Phosphatase 574 (H) 38 - 126 U/L   Total Bilirubin 17.2 (H) 0.0 - 1.2 mg/dL   GFR, Estimated 10 (L) >60 mL/min    Comment: (NOTE) Calculated using the CKD-EPI Creatinine Equation (2021)    Anion gap 18 (H) 5 -  15    Comment: Performed at [facility], [address] [place], [place] [address]  Magnesium     Status: Abnormal   Collection Time: 11/04/23  8:58 AM  Result Value Ref Range   Magnesium 3.3 (H) 1.7 - 2.4 mg/dL    Comment: Performed at [facility], [address], [place], [place] [address]   US BIOPSY (LIVER) Result Date: 11/03/2023 INDICATION: Liver metastases EXAM: ULTRASOUND CORE BIOPSY LEFT LIVER METASTASIS MEDICATIONS: 1% LIDOCAINE LOCAL ANESTHESIA/SEDATION: Moderate (conscious) sedation was employed during this procedure. A total of Versed 0.5 mg and Fentanyl 37.5 mcg was administered intravenously by the radiology nurse. Total intra-service moderate Sedation Time: 15 minutes. The patient's level of consciousness and vital signs were monitored continuously by radiology nursing throughout the procedure under my direct supervision. COMPLICATIONS: None immediate. PROCEDURE: Informed written consent was obtained from the patient after a thorough discussion of the procedural risks, benefits and  alternatives. All questions were addressed. Maximal Sterile Barrier Technique was utilized including caps, mask, sterile gowns, sterile gloves, sterile drape, hand hygiene and skin antiseptic. A timeout was performed prior to the initiation of the procedure. Previous imaging reviewed. Preliminary ultrasound performed. A left hepatic lesion was localized anteriorly and marked in the subxiphoid area. Under sterile conditions and local anesthesia, a 17 gauge guide needle was advanced into the lesion. Needle position confirmed with ultrasound. 18 gauge core biopsies obtained. Samples were intact and non fragmented. These were placed in formalin. Images obtained for documentation. Needle tract occluded with Gel-Foam. Postprocedure imaging demonstrates no hemorrhage or hematoma. Patient tolerated the biopsy well. IMPRESSION: Successful ultrasound left hepatic lesion 18 gauge core biopsy Electronically Signed   By: Judie Petit.  Shick M.D.   On: 11/03/2023 12:05   US RENAL Result Date: 11/03/2023 CLINICAL DATA:  Metastatic colorectal cancer discovered on CT abdomen and pelvis 10/30/2023, presents with acute kidney injury. [id number]. EXAM: RENAL / URINARY TRACT ULTRASOUND COMPLETE COMPARISON:  CT with IV contrast 10/30/2023. FINDINGS: Right Kidney: Renal measurements: 12.5 x 4.3 x 5.8 cm = volume: 164.86 mL. Echogenicity within normal limits. No mass, stones or hydronephrosis visualized. Left Kidney: Renal measurements: 11.6 x 5.6 x 3.8 cm = volume: 128.80 mL. Echogenicity within normal limits. No mass, stones or hydronephrosis visualized. Bladder: Contracted and not well seen. Other: Heterogeneous liver with diffuse metastases. Minimal pelvic ascites. IMPRESSION: 1. No evidence of hydronephrosis. Sonographically unremarkable kidneys. 2. Heterogeneous liver with diffuse metastases. 3. Minimal pelvic ascites. Electronically Signed   By: Almira Bar M.D.   On: 11/03/2023 01:43   CT CHEST WO CONTRAST Result Date:  11/02/2023 CLINICAL DATA:  Evaluate chest for metastases.  Rectal mass. EXAM: CT CHEST WITHOUT CONTRAST TECHNIQUE: Multidetector CT imaging of the chest was performed following the standard protocol without IV contrast. RADIATION DOSE REDUCTION: This exam was performed according to the departmental dose-optimization program which includes automated exposure control, adjustment of the mA and/or kV according to patient size and/or use of iterative reconstruction technique. COMPARISON:  None Available. FINDINGS: Cardiovascular: Heart is normal size. Aorta is normal caliber. Diffuse coronary artery calcifications throughout the left coronary arteries and scattered calcifications throughout the right coronary artery. Scattered aortic atherosclerosis. Mediastinum/Nodes: No mediastinal, hilar, or axillary adenopathy. Trachea and esophagus are unremarkable. Thyroid unremarkable. Lungs/Pleura: Numerous bilateral pulmonary nodules compatible with metastases. The largest on the right is in the posteromedial right lower lobe on image 92 measuring 1.5 cm. Largest on the left is in the left lower lobe on image 86  measuring 1.1 cm. No effusions. Upper Abdomen: Heterogeneous appearance of the liver compatible with metastases as seen on prior abdominal CT. Musculoskeletal: Chest wall soft tissues are unremarkable. No acute bony abnormality or focal bone lesion. IMPRESSION: Numerous bilateral pulmonary nodules with largest nodule measured above. Findings compatible with metastases. Innumerable hepatic metastases better seen on prior abdominal CT. Extensive coronary artery disease. Aortic Atherosclerosis (ICD10-I70.0). Electronically Signed   By: Charlett Nose M.D.   On: 11/02/2023 21:39    PMH:   Past Medical History:  Diagnosis Date   CHF (congestive heart failure) (HCC)    Chronic systolic heart failure (HCC)    a. NICM b. ECHO (11/2012): EF 15-20%, Diff HK, grade IV DD, mod MR, LA mildly dilated c. RHC (02/2013): RA: 3, RV:  35/7, PA: 32/11 (19), PCWP: 6, PA sat 69%, Fick CO/CI: 4.6 / 2.8 d. ECHO (12/2013): EF 35-40%, diff HK, grade I DD, trivial MR, LA mildly dialted, RV nl   Hypertension, malignant    NICM (nonischemic cardiomyopathy) (HCC)    LHC (02/2013): Lmain: no sig. dz, LAD: luminal irregularities, LCx: luminal irregularities, co-dominant, RCA: luminal irregularities, rel small, co-dominant with LCx   Tobacco abuse     PSH:   Past Surgical History:  Procedure Laterality Date   COLONOSCOPY N/A 11/01/2023   Procedure: COLONOSCOPY;  Surgeon: Ottie Glazier, MD;  Location: [facility] ENDOSCOPY;  Service: Gastroenterology;  Laterality: N/A;  biopsy   POLYPECTOMY  11/01/2023   Procedure: POLYPECTOMY;  Surgeon: Ottie Glazier, MD;  Location: [facility] ENDOSCOPY;  Service: Gastroenterology;;   TUBAL LIGATION  2000    Allergies: No Known Allergies  Medications:   Prior to Admission medications   Medication Sig Start Date End Date Taking? Authorizing Provider  acetaminophen (TYLENOL) 325 MG tablet Take 650 mg by mouth every 4 (four) hours as needed for moderate pain.   Yes [provider]  carvedilol (COREG) 6.25 MG tablet TAKE 1 TABLET BY MOUTH TWICE DAILY WITH MEALS . APPOINTMENT REQUIRED FOR FUTURE REFILLS 09/01/23  Yes Laurey Morale, MD  cetirizine (ZYRTEC ALLERGY) 10 MG tablet Take 1 tablet (10 mg total) by mouth daily. Patient taking differently: Take 10 mg by mouth daily as needed for allergies. 08/07/22  Yes Wallis Bamberg, PA-C  hydrALAZINE (APRESOLINE) 50 MG tablet Take 1 tablet (50 mg total) by mouth 2 (two) times daily. NEEDS FOLLOW UP APPOINTMENT FOR MORE REFILLS 05/20/23  Yes Laurey Morale, MD  isosorbide mononitrate (IMDUR) 30 MG 24 hr tablet Take 1 tablet (30 mg total) by mouth daily. NEEDS FOLLOW UP APPOINTMENT FOR MORE REFILLS 05/20/23  Yes Laurey Morale, MD  sacubitril-valsartan (ENTRESTO) 49-51 MG Take 1 tablet by mouth 2 (two) times daily. 10/05/22  Yes Laurey Morale, MD  spironolactone  (ALDACTONE) 25 MG tablet Take 1 tablet by mouth once daily 02/25/23  Yes Laurey Morale, MD  propylthiouracil (PTU) 50 MG tablet Take 1 tablet (50 mg total) by mouth daily in the afternoon. Patient not taking: Reported on 10/31/2023 10/19/23   Shamleffer, Konrad Dolores, MD    Discontinued Meds:   Medications Discontinued During This Encounter  Medication Reason   enoxaparin (LOVENOX) injection 40 mg    buPROPion (WELLBUTRIN SR) 150 MG 12 hr tablet Patient Preference   predniSONE (DELTASONE) 10 MG tablet Completed Course   promethazine-dextromethorphan (PROMETHAZINE-DM) 6.25-15 MG/5ML syrup Completed Course   bisacodyl (DULCOLAX) EC tablet 5 mg    enoxaparin (LOVENOX) injection 40 mg    lactated ringers infusion  Social History:  reports that she has been smoking cigarettes. She has never used smokeless tobacco. She reports that she does not currently use alcohol. She reports that she does not use drugs.  Family History:   Family History  Problem Relation Age of Onset   Stroke Mother    Heart disease Mother    Heart disease Father     Blood pressure 110/68, pulse 83, temperature 98.6 F (37 C), temperature source Oral, resp. rate 18, height 5' (1.524 m), weight 65.4 kg, last menstrual period 11/16/2012, SpO2 100%. Physical Exam: General appearance: NAD Head: NCAT Neck: no JVD Back: No CVA tenderness. Cardio: regular rate and rhythm GI: distended Extremities: no edema Pulses: 2+ and symmetric Skin: Skin color, texture, turgor normal.      Ethelene Hal, MD 11/04/2023, 11:52 AM

## 2023-11-05 DIAGNOSIS — K6289 Other specified diseases of anus and rectum: Secondary | ICD-10-CM | POA: Diagnosis not present

## 2023-11-05 LAB — CBC
HCT: 29.9 % — ABNORMAL LOW (ref 36.0–46.0)
Hemoglobin: 10.1 g/dL — ABNORMAL LOW (ref 12.0–15.0)
MCH: 30.8 pg (ref 26.0–34.0)
MCHC: 33.8 g/dL (ref 30.0–36.0)
MCV: 91.2 fL (ref 80.0–100.0)
Platelets: 435 10*3/uL — ABNORMAL HIGH (ref 150–400)
RBC: 3.28 MIL/uL — ABNORMAL LOW (ref 3.87–5.11)
RDW: 19.8 % — ABNORMAL HIGH (ref 11.5–15.5)
WBC: 17.1 10*3/uL — ABNORMAL HIGH (ref 4.0–10.5)
nRBC: 0.2 % (ref 0.0–0.2)

## 2023-11-05 LAB — COMPREHENSIVE METABOLIC PANEL
ALT: 1346 U/L — ABNORMAL HIGH (ref 0–44)
AST: 3303 U/L — ABNORMAL HIGH (ref 15–41)
Albumin: 2.5 g/dL — ABNORMAL LOW (ref 3.5–5.0)
Alkaline Phosphatase: 580 U/L — ABNORMAL HIGH (ref 38–126)
Anion gap: 26 — ABNORMAL HIGH (ref 5–15)
BUN: 110 mg/dL — ABNORMAL HIGH (ref 6–20)
CO2: 11 mmol/L — ABNORMAL LOW (ref 22–32)
Calcium: 8.3 mg/dL — ABNORMAL LOW (ref 8.9–10.3)
Chloride: 95 mmol/L — ABNORMAL LOW (ref 98–111)
Creatinine, Ser: 5.92 mg/dL — ABNORMAL HIGH (ref 0.44–1.00)
GFR, Estimated: 8 mL/min — ABNORMAL LOW (ref >60)
Glucose, Bld: 101 mg/dL — ABNORMAL HIGH (ref 70–99)
Potassium: >7.5 mmol/L (ref 3.5–5.1)
Sodium: 132 mmol/L — ABNORMAL LOW (ref 135–145)
Total Bilirubin: 19.1 mg/dL (ref 0.0–1.2)
Total Protein: 6.6 g/dL (ref 6.5–8.1)

## 2023-11-05 MED ORDER — LORAZEPAM 2 MG/ML IJ SOLN
0.5000 mg | INTRAMUSCULAR | Status: DC | PRN
  Administered 2023-11-05: 0.5 mg via INTRAVENOUS
  Filled 2023-11-05: qty 1

## 2023-11-05 MED ORDER — MORPHINE SULFATE (PF) 2 MG/ML IV SOLN
2.0000 mg | INTRAVENOUS | Status: DC | PRN
  Administered 2023-11-05: 2 mg via INTRAVENOUS
  Filled 2023-11-05: qty 1

## 2023-11-05 MED ORDER — SODIUM ZIRCONIUM CYCLOSILICATE 10 G PO PACK
10.0000 g | PACK | Freq: Once | ORAL | Status: AC
  Administered 2023-11-05: 10 g via ORAL
  Filled 2023-11-05: qty 1

## 2023-11-08 ENCOUNTER — Encounter: Payer: Self-pay | Admitting: Pediatrics

## 2023-11-16 NOTE — Progress Notes (Signed)
 Date:  Patient: Kim Underwood Admitted: 10/31/2023 @1221  Attending Provider: Glade Lloyd, MD Shan Levans was pronounced dead at 1514 by this RN and Vikki Ports, RN. The family was present.  Notified Dr. Glade Lloyd, MD.  [place] [facility] called at 1534.  [facility] [address], [place], [place] [address] Family declined [place].  Patient's belongings sent home with Significant other Tim and Brother Casimiro Needle.

## 2023-11-16 NOTE — Progress Notes (Signed)
    0441  Provider Notification  Provider Name/Title Howerter DO  Date Provider Notified   Time Provider Notified [phone]  Method of Notification Page  Notification Reason Critical Result  Test performed and critical result K - >7.5, bilirubin 19.1  Date Critical Result Received   Time Critical Result Received [phone]

## 2023-11-16 NOTE — Progress Notes (Signed)
 [facility] [phone] [facility] Liaison Note  Received request from [facility] manager for family interest in [facility]. Eligibility confirmed. Met with patient and family to confirm interest and explain services. Family agreeable to transfer today. TOC aware. RN please call report to [phone] prior to patient leaving the unit. Please send signed DNR with patient at discharge.   Thank you for allowing [place] to participate in this patient's care.  Henderson Newcomer, LPN [facility] Liaison [phone]

## 2023-11-16 NOTE — Progress Notes (Signed)
 [facility] night cross cover note:   Elevated potassium and t bili noted in this patient on comfort care measures only. Consistent with these wishes, will refrain from aggressive corrective management thereof.    [name], DO Hospitalist

## 2023-11-16 NOTE — TOC Transition Note (Signed)
 Transition of Care [facility]) - Discharge Note   Patient Details  Name: Kim Underwood MRN: [id number] Date of Birth: 09-Mar-1966  [facility] CM/SW Contact:  Michaela Corner, LCSWA Phone Number: , 3:04 PM   Clinical Narrative:   Patient will DC to: [facility] Anticipated DC date:  Family notified: Jorja Loa (spouse) Transport by: Sharin Mons   Per MD patient ready for DC to [facility]. RN to call report prior to discharge [phone]). RN, patient, patient's family, and facility notified of DC. Discharge Summary and FL2 sent to facility. DC packet on chart. Ambulance transport requested for patient.   CSW will sign off for now as social work intervention is no longer needed. Please consult [place] again if new needs arise.      Final next level of care: Hospice Medical Facility Barriers to Discharge: Barriers Resolved   Patient Goals and CMS Choice Patient states their goals for this hospitalization and ongoing recovery are:: return home   Choice offered to / list presented to : NA      Discharge Placement              Patient chooses bed at: Other - please specify in the comment section below: [facility]) Patient to be transferred to facility by: [facility] Name of family member notified: Jorja Loa (spouse) Patient and family notified of of transfer:   Discharge Plan and Services Additional resources added to the After Visit Summary for   In-house Referral: NA Discharge Planning Services: CM Consult Post Acute Care Choice: NA          DME Arranged: N/A DME Agency: NA       HH Arranged: NA          Social Drivers of Health (SDOH) Interventions SDOH Screenings   Food Insecurity: No Food Insecurity (10/30/2023)  Housing: Low Risk  (10/30/2023)  Transportation Needs: No Transportation Needs (10/30/2023)  Utilities: Not At Risk (10/30/2023)  Tobacco Use: High Risk (10/30/2023)     Readmission Risk Interventions    11/01/2023   11:38  AM  Readmission Risk Prevention Plan  Transportation Screening Complete  PCP or Specialist Appt within 5-7 Days Complete  Home Care Screening Complete  Medication Review (RN CM) Complete

## 2023-11-16 NOTE — Discharge Summary (Signed)
 Physician Discharge Summary  Kim Underwood VEL:[id number] DOB: 1966-07-22 DOA: 10/30/2023  PCP: Patient, No Pcp Per  Admit date: 10/30/2023 Discharge date:   Admitted From: Home Disposition: Residential hospice  Recommendations for Outpatient Follow-up:  Follow up with provider at residential hospice at earliest convenience   Home Health: No Equipment/Devices: None  Discharge Condition: Poor CODE STATUS: DNR Diet recommendation: As per comfort measures Brief/Interim Summary: 58 y.o. female with PMH significant for hypertension, chronic systolic CHF/nonischemic cardiomyopathy currently with improved EF presented for evaluation of abdominal bloating, fullness. CT abdomen/ pelvis showed bulky rectal mass with extension to the presacral space most consistent with primary rectal adenocarcinoma.  GI was consulted.  She underwent colonoscopy on 11/02/2023 which showed possible malignant tumor in the rectum which was biopsied.  Oncology was consulted.  Nephrology also consulted for AKI.  She underwent liver biopsy by IR on 11/03/2023.  Oncology subsequently recommended hospice.  Patient has decided to proceed with residential hospice.  She will be discharged to residential hospice once bed is available.   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 Patient agreeable for hospice. Unfortunately, her clinical condition has deteriorated very rapidly with signs of multiorgan failure including worsening renal failure with  hyperkalemia, liver failure with worsening LFTs and leukocytosis. She will benefit from residential hospice placement. She will be discharged to residential hospice once bed is available.  Discharge Instructions   Allergies as of    No Known Allergies      Medication List     STOP taking these medications    acetaminophen 325 MG tablet Commonly known as: TYLENOL   carvedilol 6.25 MG tablet Commonly known as: COREG   cetirizine 10 MG tablet Commonly known as: ZyrTEC Allergy   Entresto 49-51 MG Generic drug: sacubitril-valsartan   hydrALAZINE 50 MG tablet Commonly known as: APRESOLINE   isosorbide mononitrate 30 MG 24 hr tablet Commonly known as: IMDUR   propylthiouracil 50 MG tablet Commonly known as: PTU   spironolactone 25 MG tablet Commonly known as: ALDACTONE        No Known Allergies  Consultations: GI/oncology/nephrology/IR    Procedures/Studies: US BIOPSY (LIVER) Result Date: 11/03/2023 INDICATION: Liver metastases EXAM: ULTRASOUND CORE BIOPSY LEFT LIVER METASTASIS MEDICATIONS: 1% LIDOCAINE LOCAL ANESTHESIA/SEDATION: Moderate (conscious) sedation was employed during this procedure. A total of Versed 0.5 mg and Fentanyl 37.5 mcg was administered intravenously by the radiology nurse. Total intra-service moderate Sedation Time: 15 minutes. The patient's level of consciousness and vital signs were monitored continuously by radiology nursing throughout the procedure under my direct supervision. COMPLICATIONS: None immediate. PROCEDURE: Informed written consent was obtained from the patient after a thorough discussion of the procedural risks, benefits and alternatives. All questions were addressed. Maximal Sterile Barrier Technique was utilized including caps, mask, sterile gowns, sterile gloves, sterile drape, hand hygiene and skin antiseptic. A timeout was performed prior to the initiation of the procedure. Previous imaging reviewed. Preliminary  ultrasound performed. A left hepatic lesion was localized anteriorly and marked in the subxiphoid area. Under sterile conditions and local anesthesia, a 17 gauge guide needle was advanced into the lesion. Needle position confirmed with ultrasound. 18 gauge core biopsies obtained. Samples were  intact and non fragmented. These were placed in formalin. Images obtained for documentation. Needle tract occluded with Gel-Foam. Postprocedure imaging demonstrates no hemorrhage or hematoma. Patient tolerated the biopsy well. IMPRESSION: Successful ultrasound left hepatic lesion 18 gauge core biopsy Electronically Signed   By: Judie Petit.  Shick M.D.   On: 11/03/2023 12:05   US RENAL Result Date: 11/03/2023 CLINICAL DATA:  Metastatic colorectal cancer discovered on CT abdomen and pelvis 10/30/2023, presents with acute kidney injury. [id number]. EXAM: RENAL / URINARY TRACT ULTRASOUND COMPLETE COMPARISON:  CT with IV contrast 10/30/2023. FINDINGS: Right Kidney: Renal measurements: 12.5 x 4.3 x 5.8 cm = volume: 164.86 mL. Echogenicity within normal limits. No mass, stones or hydronephrosis visualized. Left Kidney: Renal measurements: 11.6 x 5.6 x 3.8 cm = volume: 128.80 mL. Echogenicity within normal limits. No mass, stones or hydronephrosis visualized. Bladder: Contracted and not well seen. Other: Heterogeneous liver with diffuse metastases. Minimal pelvic ascites. IMPRESSION: 1. No evidence of hydronephrosis. Sonographically unremarkable kidneys. 2. Heterogeneous liver with diffuse metastases. 3. Minimal pelvic ascites. Electronically Signed   By: Almira Bar M.D.   On: 11/03/2023 01:43   CT CHEST WO CONTRAST Result Date: 11/02/2023 CLINICAL DATA:  Evaluate chest for metastases.  Rectal mass. EXAM: CT CHEST WITHOUT CONTRAST TECHNIQUE: Multidetector CT imaging of the chest was performed following the standard protocol without IV contrast. RADIATION DOSE REDUCTION: This exam was performed according to the departmental  dose-optimization program which includes automated exposure control, adjustment of the mA and/or kV according to patient size and/or use of iterative reconstruction technique. COMPARISON:  None Available. FINDINGS: Cardiovascular: Heart is normal size. Aorta is normal caliber. Diffuse coronary artery calcifications throughout the left coronary arteries and scattered calcifications throughout the right coronary artery. Scattered aortic atherosclerosis. Mediastinum/Nodes: No mediastinal, hilar, or axillary adenopathy. Trachea and esophagus are unremarkable. Thyroid unremarkable. Lungs/Pleura: Numerous bilateral pulmonary nodules compatible with metastases. The largest on the right is in the posteromedial right lower lobe on image 92 measuring 1.5 cm. Largest on the left is in the left lower lobe on image 86 measuring 1.1 cm. No effusions. Upper Abdomen: Heterogeneous appearance of the liver compatible with metastases as seen on prior abdominal CT. Musculoskeletal: Chest wall soft tissues are unremarkable. No acute bony abnormality or focal bone lesion. IMPRESSION: Numerous bilateral pulmonary nodules with largest nodule measured above. Findings compatible with metastases. Innumerable hepatic metastases better seen on prior abdominal CT. Extensive coronary artery disease. Aortic Atherosclerosis (ICD10-I70.0). Electronically Signed   By: Charlett Nose M.D.   On: 11/02/2023 21:39   ECHOCARDIOGRAM COMPLETE Result Date: 10/31/2023    ECHOCARDIOGRAM REPORT   Patient Name:   Kim Underwood Date of Exam: 10/31/2023 Medical Rec #:  [id number]            Height:       60.0 in Accession #:    [id number]           Weight:       140.4 lb Date of Birth:  25-Aug-1965             BSA:          1.606 m Patient Age:    57 years             BP:           103/64 mmHg Patient Gender: F                    HR:  82 bpm. Exam Location:  Inpatient Procedure: 2D Echo, Cardiac Doppler, Color Doppler and Strain Analysis (Both             Spectral and Color Flow Doppler were utilized during procedure). Indications:    Abnormal ECG 794.31 / R94.31  History:        Patient has prior history of Echocardiogram examinations, most                 recent 11/26/2020. CHF; Risk Factors:Hypertension and Current                 Smoker.  Sonographer:    Lucendia Herrlich RCS Referring Phys: [id number] BINAYA DAHAL IMPRESSIONS  1. Left ventricular ejection fraction, by estimation, is 60 to 65%. The left ventricle has normal function. The left ventricle has no regional wall motion abnormalities. Left ventricular diastolic parameters are indeterminate.  2. Right ventricular systolic function is normal. The right ventricular size is normal.  3. The mitral valve is normal in structure. Trivial mitral valve regurgitation. No evidence of mitral stenosis.  4. Tricuspid valve regurgitation is mild to moderate.  5. The aortic valve is normal in structure. Aortic valve regurgitation is not visualized. No aortic stenosis is present.  6. The inferior vena cava is normal in size with greater than 50% respiratory variability, suggesting right atrial pressure of 3 mmHg. FINDINGS  Left Ventricle: Left ventricular ejection fraction, by estimation, is 60 to 65%. The left ventricle has normal function. The left ventricle has no regional wall motion abnormalities. Global longitudinal strain performed but not reported based on interpreter judgement due to suboptimal tracking. The left ventricular internal cavity size was normal in size. There is no left ventricular hypertrophy. Left ventricular diastolic parameters are indeterminate. Right Ventricle: The right ventricular size is normal. No increase in right ventricular wall thickness. Right ventricular systolic function is normal. Left Atrium: Left atrial size was normal in size. Right Atrium: Right atrial size was normal in size. Pericardium: There is no evidence of pericardial effusion. Mitral Valve: The mitral valve is normal in  structure. Trivial mitral valve regurgitation. No evidence of mitral valve stenosis. Tricuspid Valve: The tricuspid valve is normal in structure. Tricuspid valve regurgitation is mild to moderate. No evidence of tricuspid stenosis. Aortic Valve: The aortic valve is normal in structure. Aortic valve regurgitation is not visualized. No aortic stenosis is present. Aortic valve peak gradient measures 11.3 mmHg. Pulmonic Valve: The pulmonic valve was normal in structure. Pulmonic valve regurgitation is mild. No evidence of pulmonic stenosis. Aorta: The aortic root is normal in size and structure. Venous: The inferior vena cava is normal in size with greater than 50% respiratory variability, suggesting right atrial pressure of 3 mmHg. IAS/Shunts: No atrial level shunt detected by color flow Doppler.  LEFT VENTRICLE PLAX 2D LVIDd:         4.30 cm   Diastology LVIDs:         2.90 cm   LV e' medial:    6.42 cm/s LV PW:         0.90 cm   LV E/e' medial:  7.9 LV IVS:        0.90 cm   LV e' lateral:   7.94 cm/s LVOT diam:     2.10 cm   LV E/e' lateral: 6.4 LV SV:         62 LV SV Index:   38 LVOT Area:     3.46 cm  RIGHT VENTRICLE  IVC RV S prime:     17.20 cm/s  IVC diam: 1.10 cm TAPSE (M-mode): 2.4 cm LEFT ATRIUM             Index        RIGHT ATRIUM          Index LA diam:        3.90 cm 2.43 cm/m   RA Area:     9.22 cm LA Vol (A2C):   35.9 ml 22.35 ml/m  RA Volume:   15.00 ml 9.34 ml/m LA Vol (A4C):   52.9 ml 32.94 ml/m LA Biplane Vol: 43.4 ml 27.02 ml/m  AORTIC VALVE AV Area (Vmax): 2.21 cm AV Vmax:        168.00 cm/s AV Peak Grad:   11.3 mmHg LVOT Vmax:      107.00 cm/s LVOT Vmean:     70.600 cm/s LVOT VTI:       0.178 m  AORTA Ao Root diam: 3.40 cm Ao Asc diam:  3.40 cm MITRAL VALVE                TRICUSPID VALVE MV Area (PHT): 4.31 cm     TR Peak grad:   30.9 mmHg MV Decel Time: 176 msec     TR Vmax:        278.00 cm/s MR Peak grad: 29.4 mmHg MR Vmax:      271.00 cm/s   SHUNTS MV E velocity: 50.60  cm/s   Systemic VTI:  0.18 m MV A velocity: 100.00 cm/s  Systemic Diam: 2.10 cm MV E/A ratio:  0.51 Kardie Tobb DO Electronically signed by Thomasene Ripple DO Signature Date/Time: 10/31/2023/3:26:54 PM    Final    CT ABDOMEN PELVIS W CONTRAST Result Date: 10/30/2023 CLINICAL DATA:  Abdominal pain and bloating.  Abnormal ultrasound EXAM: CT ABDOMEN AND PELVIS WITH CONTRAST TECHNIQUE: Multidetector CT imaging of the abdomen and pelvis was performed using the standard protocol following bolus administration of intravenous contrast. RADIATION DOSE REDUCTION: This exam was performed according to the departmental dose-optimization program which includes automated exposure control, adjustment of the mA and/or kV according to patient size and/or use of iterative reconstruction technique. CONTRAST:  75mL OMNIPAQUE IOHEXOL 350 MG/ML SOLN COMPARISON:  Ultrasound same day FINDINGS: Lower chest: Bilateral round lower lobe pulmonary nodules. Nodules of varying size. Nodule in RIGHT lower lobe medially along the pleural surface measures 14 mm (image 12). Nodule LEFT lower lobe measures 5 mm on image 7. Proximally 3-6 nodules in each lung base. Hepatobiliary: Innumerable hypoenhancing lesions within LEFT and RIGHT hepatic lobe. The liver is expanded by this near confluent pattern. Largest lesion in the RIGHT hepatic lobe measures 6.5 cm (image 14/3). Small example lesion in the LEFT hepatic lobe measures 2 cm image 18/3. These lesions are too numerous to count range in size from 10 mm to 65 mm. Gallbladder collapsed Pancreas: Pancreas is normal. No ductal dilatation. No pancreatic inflammation. Spleen: Normal spleen Adrenals/urinary tract: Adrenal glands and kidneys are normal. The ureters and bladder normal. Stomach/Bowel: Stomach, duodenum small-bowel normal. Appendix not identified. Ascending and transverse colon normal. Descending colon normal. There is asymmetric thickening in the mid and distal rectum along the LEFT wall of the  rectum measuring 3.2 cm (image 64/3). Bulky endoluminal rectal mass is well seen on coronal imaging measuring 4.8 cm (image 26/6). enlarged lymph nodes in the LEFT mesorectum measuring 10-12 mm on image 64/3. Exophytic extension from the rectum to the presacral space measuring 3.7  cm on image 60/3 Vascular/Lymphatic: No upper abdominal adenopathy. Axial adenopathy described above. Reproductive: Post hysterectomy anatomy. There is irregularity in the upper aspect of the vagina/vaginal cuff (image 63/3) Other: No free fluid. Musculoskeletal: No aggressive osseous lesion. IMPRESSION: 1. Bulky rectal mass with extension to the presacral space most consistent with primary rectal adenocarcinoma. No evidence of bowel obstruction. 2. Local nodal metastasis to the mesorectum. 3. Potential local extension to upper vagina.  Post hysterectomy. 4. Innumerable HEPATIC METASTASIS. 5. Bilateral lower lobe pulmonary nodules consistent with PULMONARY METASTASIS. Findings conveyed toKommer, MD on 10/30/2023  at14:03. Electronically Signed   By: Genevive Bi M.D.   On: 10/30/2023 14:03   US Abdomen Limited RUQ (LIVER/GB) Result Date: 10/30/2023 CLINICAL DATA:  [id number] Transaminitis [id number] EXAM: ULTRASOUND ABDOMEN LIMITED RIGHT UPPER QUADRANT COMPARISON:  None Available. FINDINGS: Gallbladder: Partially contracted. No gallstones or wall thickening visualized. No sonographic Murphy sign noted by sonographer. Common bile duct: Diameter: 6 mm Liver: Diffusely heterogeneous appearance of the liver with coarsened echotexture and generally increased parenchymal echogenicity. Possible solid mass in the caudate lobe measuring 4.5 cm. Nodular hepatic surface contour. Portal vein is patent on color Doppler imaging with normal direction of blood flow towards the liver. Other: None. IMPRESSION: 1. Cirrhotic morphology of the liver with possible solid mass in the caudate lobe measuring 4.5 cm. Further evaluation with contrast-enhanced MRI of  the abdomen is recommended. 2. No evidence of cholelithiasis or acute cholecystitis. Electronically Signed   By: Duanne Guess D.O.   On: 10/30/2023 13:16   DG Chest 2 View Result Date: 10/30/2023 CLINICAL DATA:  Shortness of breath EXAM: CHEST - 2 VIEW COMPARISON:  09/03/2015 FINDINGS: The heart size and mediastinal contours are within normal limits. No focal airspace consolidation, pleural effusion, or pneumothorax. The visualized skeletal structures are unremarkable. IMPRESSION: No active cardiopulmonary disease. Electronically Signed   By: Duanne Guess D.O.   On: 10/30/2023 12:00   DG Abd 1 View Result Date: 10/26/2023 CLINICAL DATA:  Abdominal bloating. EXAM: ABDOMEN - 1 VIEW COMPARISON:  Radiograph dated 09/09/2009. FINDINGS: Mild colonic stool burden. No bowel dilatation or evidence of obstruction. No free air. The osseous structures are intact. The soft tissues are unremarkable IMPRESSION: Nonobstructive bowel gas pattern. Electronically Signed   By: Elgie Collard M.D.   On: 10/26/2023 15:36   US THYROID Result Date: 10/13/2023 CLINICAL DATA:  Hyperthyroid. EXAM: THYROID ULTRASOUND TECHNIQUE: Ultrasound examination of the thyroid gland and adjacent soft tissues was performed. COMPARISON:  None Available. FINDINGS: Parenchymal Echotexture: Moderately heterogeneous Isthmus: 0.5 cm Right lobe: 4.2 x 1.6 x 1.8 cm Left lobe: 5.0 x 3.0 x 2.3 cm ________________________________________________________ Estimated total number of nodules >/= 1 cm: 4 Number of spongiform nodules >/=  2 cm not described below (TR1): 0 Number of mixed cystic and solid nodules >/= 1.5 cm not described below (TR2): 0 _________________________________________________________ Nodule # 1: Location: Isthmus; Superior Maximum size: 0.6 cm; Other 2 dimensions: 0.4 x 0.5 cm Composition: spongiform (0) Echogenicity: hypoechoic (2) Shape: not taller-than-wide (0) Margins: smooth (0) Echogenic foci: none (0) ACR TI-RADS total  points: 2. ACR TI-RADS risk category: TR2 (2 points). ACR TI-RADS recommendations: This nodule does NOT meet TI-RADS criteria for biopsy or dedicated follow-up. _________________________________________________________ Nodule # 2: Location: Right; Superior Maximum size: 1.1 cm; Other 2 dimensions: 1.1 x 0.9 cm Composition: mixed cystic and solid (1) Echogenicity: isoechoic (1) Shape: not taller-than-wide (0) Margins: smooth (0) Echogenic foci: macrocalcifications (1) ACR TI-RADS total points: 3. ACR TI-RADS risk  category: TR3 (3 points). ACR TI-RADS recommendations: Given size (<1.4 cm) and appearance, this nodule does NOT meet TI-RADS criteria for biopsy or dedicated follow-up. _________________________________________________________ Nodule # 3: Location: Right; Mid Maximum size: 0.7 cm; Other 2 dimensions: 0.6 x 0.6 cm Composition: mixed cystic and solid (1) Echogenicity: isoechoic (1) Shape: not taller-than-wide (0) Margins: smooth (0) Echogenic foci: none (0) ACR TI-RADS total points: 2. ACR TI-RADS risk category: TR2 (2 points). ACR TI-RADS recommendations: This nodule does NOT meet TI-RADS criteria for biopsy or dedicated follow-up. _________________________________________________________ Nodule # 4: Location: Right; Inferior Maximum size: 1.0 cm; Other 2 dimensions: 1.0 x 0.7 cm Composition: mixed cystic and solid (1) Echogenicity: isoechoic (1) Shape: not taller-than-wide (0) Margins: smooth (0) Echogenic foci: none (0) ACR TI-RADS total points: 2. ACR TI-RADS risk category: TR2 (2 points). ACR TI-RADS recommendations: This nodule does NOT meet TI-RADS criteria for biopsy or dedicated follow-up. _________________________________________________________ Nodule # 5: Location: Left; Superior Maximum size: 2.0 cm; Other 2 dimensions: 1.8 x 1.4 cm Composition: mixed cystic and solid (1) Echogenicity: isoechoic (1) Shape: not taller-than-wide (0) Margins: smooth (0) Echogenic foci: none (0) ACR TI-RADS total  points: 2. ACR TI-RADS risk category: TR2 (2 points). ACR TI-RADS recommendations: This nodule does NOT meet TI-RADS criteria for biopsy or dedicated follow-up. _________________________________________________________ Nodule # 6: Location: Left; Mid Maximum size: 0.8 cm; Other 2 dimensions: 0.8 x 0.6 cm Composition: spongiform (0) Echogenicity: hypoechoic (2) Shape: not taller-than-wide (0) Margins: smooth (0) Echogenic foci: none (0) ACR TI-RADS total points: 2. ACR TI-RADS risk category: TR2 (2 points). ACR TI-RADS recommendations: This nodule does NOT meet TI-RADS criteria for biopsy or dedicated follow-up. _________________________________________________________ Pseudo nodularity about the left inferior thyroid without discrete margins. No cervical lymphadenopathy. IMPRESSION: 1. Multinodular thyroid. 2. Each of the visualized bilateral thyroid nodules appears benign and does not warrant additional follow-up. The above is in keeping with the ACR TI-RADS recommendations - J Am Coll Radiol 2017;14:587-595. Marliss Coots, MD [facility] Radiology Electronically Signed   By: Marliss Coots M.D.   On: 10/13/2023 11:06      Subjective: Patient seen and examined at bedside.  No fever, seizures, agitation reported.   Discharge Exam: Vitals:   11/04/23 1938  0801  BP: 91/61   Pulse: 79   Resp: 18 (!) 26  Temp: 98 F (36.7 C) 97.8 F (36.6 C)  SpO2: 94%     General: Remains on room air.  Looks chronically ill and deconditioned.  Currently in no distress.     The results of significant diagnostics from this hospitalization (including imaging, microbiology, ancillary and laboratory) are listed below for reference.     Microbiology: No results found for this or any previous visit (from the past 240 hours).   Labs: BNP (last 3 results) Recent Labs    10/30/23 1041  BNP 26.0   Basic Metabolic Panel: Recent Labs  Lab 11/01/23 0330  11/02/23 1012 11/03/23 0228 11/04/23 0858  0256  NA 130* 128* 131* 132* 132*  K 4.3 5.2* 5.1 6.4* >7.5*  CL 97* 98 97* 98 95*  CO2 19* 16* 19* 16* 11*  GLUCOSE 102* 108* 100* 77 101*  BUN 54* 78* 81* 99* 110*  CREATININE 2.20* 3.52* 3.44* 4.60* 5.92*  CALCIUM 8.2* 7.9* 8.3* 8.5* 8.3*  MG  --   --   --  3.3*  --    Liver Function Tests: Recent Labs  Lab 10/31/23 0258 11/01/23 0330 11/03/23 0228 11/04/23 0858   0256  AST 423* 422* 926* 1,785* 3,303*  ALT 268* 293* 505* 825* 1,346*  ALKPHOS 585* 521* 510* 574* 580*  BILITOT 10.2* 11.1* 14.7* 17.2* 19.1*  PROT 6.3* 6.3* 6.2* 6.3* 6.6  ALBUMIN 2.5* 2.5* 2.5* 2.4* 2.5*   Recent Labs  Lab 10/30/23 1041  LIPASE 109*   No results for input(s): "AMMONIA" in the last 168 hours. CBC: Recent Labs  Lab 10/30/23 1041 10/31/23 0258 11/01/23 0330 11/02/23 1012 11/03/23 0228 11/04/23 0230  0256  WBC 15.2*   < > 14.9* 14.5* 15.6* 16.3* 17.1*  NEUTROABS 11.8*  --  11.3*  --   --   --   --   HGB 10.6*   < > 10.0* 10.4* 9.9* 9.8* 10.1*  HCT 30.8*   < > 28.0* 29.5* 27.7* 28.5* 29.9*  MCV 88.3   < > 85.9 87.8 86.0 88.2 91.2  PLT 536*   < > 454* 453* 452* 433* 435*   < > = values in this interval not displayed.   Cardiac Enzymes: No results for input(s): "CKTOTAL", "CKMB", "CKMBINDEX", "TROPONINI" in the last 168 hours. BNP: Invalid input(s): "POCBNP" CBG: Recent Labs  Lab 11/04/23 2114 11/04/23 2215  GLUCAP 56* 170*   D-Dimer No results for input(s): "DDIMER" in the last 72 hours. Hgb A1c No results for input(s): "HGBA1C" in the last 72 hours. Lipid Profile No results for input(s): "CHOL", "HDL", "LDLCALC", "TRIG", "CHOLHDL", "LDLDIRECT" in the last 72 hours. Thyroid function studies No results for input(s): "TSH", "T4TOTAL", "T3FREE", "THYROIDAB" in the last 72 hours.  Invalid input(s): "FREET3" Anemia work up No results for input(s): "VITAMINB12", "FOLATE", "FERRITIN", "TIBC", "IRON",  "RETICCTPCT" in the last 72 hours. Urinalysis No results found for: "COLORURINE", "APPEARANCEUR", "LABSPEC", "PHURINE", "GLUCOSEU", "HGBUR", "BILIRUBINUR", "KETONESUR", "PROTEINUR", "UROBILINOGEN", "NITRITE", "LEUKOCYTESUR" Sepsis Labs Recent Labs  Lab 11/02/23 1012 11/03/23 0228 11/04/23 0230  0256  WBC 14.5* 15.6* 16.3* 17.1*   Microbiology No results found for this or any previous visit (from the past 240 hours).   Time coordinating discharge: 35 minutes  SIGNED:   Glade Lloyd, MD  [facility] , 11:07 AM

## 2023-11-16 NOTE — Plan of Care (Signed)
  Problem: Clinical Measurements: Goal: Ability to maintain clinical measurements within normal limits will improve Outcome: Progressing   Problem: Nutrition: Goal: Adequate nutrition will be maintained Outcome: Progressing   Problem: Coping: Goal: Level of anxiety will decrease Outcome: Progressing   Problem: Pain Managment: Goal: General experience of comfort will improve and/or be controlled Outcome: Progressing

## 2023-11-16 NOTE — Progress Notes (Signed)
 PROGRESS NOTE    Trinitie Mcgirr  NWG:[id number] DOB: [birth year] DOA: 10/30/2023 PCP: Patient, No Pcp Per   Brief Narrative:  58 y.o. female with PMH significant for hypertension, chronic systolic CHF/nonischemic cardiomyopathy currently with improved EF presented for evaluation of abdominal bloating, fullness. CT abdomen/ pelvis showed bulky rectal mass with extension to the presacral space most consistent with primary rectal adenocarcinoma.  GI was consulted.  She underwent colonoscopy on 11/02/2023 which showed possible malignant tumor in the rectum which was biopsied.  Oncology was consulted.  Nephrology also consulted for AKI.  She underwent liver biopsy by IR on 11/03/2023.  Oncology subsequently recommended hospice.  Patient has decided to proceed home versus residential hospice.  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  Patient agreeable for home versus residential hospice.  Unfortunately, her clinical condition has deteriorated very rapidly with signs of multiorgan failure including worsening renal failure with hyperkalemia, liver failure with worsening LFTs and leukocytosis.  She will benefit from residential hospice placement.  Will not repeat further labs.  DVT prophylaxis: None for comfort measures.  DC Lovenox Code Status: DNR  family Communication: Boyfriend at bedside on 11/03/2023.  None at bedside today Disposition Plan: Status is: Inpatient Remains inpatient appropriate because: Of severity of illness.  Need for residential hospice  placement   Consultants: GI/oncology/nephrology/IR  Procedures: As above  Antimicrobials: None   Subjective: Patient seen and examined at bedside.  No fever, seizures, agitation reported. Objective: Vitals:   11/04/23 0430 11/04/23 0719 11/04/23 1150 11/04/23 1938  BP: 110/68 110/68  91/61  Pulse: 86 83  79  Resp: 20 18 18 18   Temp: 98.2 F (36.8 C) 98.6 F (37 C)  98 F (36.7 C)  TempSrc: Oral Oral  Oral  SpO2: 96% 100%  94%  Weight: 65.4 kg     Height:       No intake or output data in the 24 hours ending  0646  Filed Weights   10/31/23 0452 11/02/23 0501 11/04/23 0430  Weight: 63.7 kg 64 kg 65.4 kg    Examination:  General: Remains on room air.  Looks chronically ill and deconditioned.  Currently in no distress.   Data Reviewed: I have personally reviewed following labs and imaging studies  CBC: Recent Labs  Lab 10/30/23 1041 10/31/23 0258 11/01/23 0330 11/02/23 1012 11/03/23 0228 11/04/23 0230  0256  WBC 15.2*   < > 14.9* 14.5* 15.6* 16.3* 17.1*  NEUTROABS 11.8*  --  11.3*  --   --   --   --   HGB 10.6*   < > 10.0* 10.4* 9.9* 9.8* 10.1*  HCT 30.8*   < > 28.0* 29.5* 27.7* 28.5* 29.9*  MCV 88.3   < > 85.9 87.8 86.0 88.2 91.2  PLT 536*   < > 454* 453* 452* 433* 435*   < > = values in this interval not displayed.   Basic Metabolic Panel: Recent Labs  Lab 11/01/23 0330 11/02/23 1012 11/03/23 0228 11/04/23 0858  0256  NA 130* 128* 131* 132* 132*  K 4.3 5.2* 5.1 6.4* >7.5*  CL 97* 98 97* 98 95*  CO2 19* 16* 19* 16* 11*  GLUCOSE 102*  108* 100* 77 101*  BUN 54* 78* 81* 99* 110*  CREATININE 2.20* 3.52* 3.44* 4.60* 5.92*  CALCIUM 8.2* 7.9* 8.3* 8.5* 8.3*  MG  --   --   --  3.3*  --    GFR: Estimated Creatinine Clearance: 8.6 mL/min (A) (by C-G formula based on SCr of 5.92 mg/dL (H)). Liver Function Tests: Recent Labs  Lab 10/31/23 0258 11/01/23 0330 11/03/23 0228 11/04/23 0858  0256  AST 423* 422* 926*  1,785* 3,303*  ALT 268* 293* 505* 825* 1,346*  ALKPHOS 585* 521* 510* 574* 580*  BILITOT 10.2* 11.1* 14.7* 17.2* 19.1*  PROT 6.3* 6.3* 6.2* 6.3* 6.6  ALBUMIN 2.5* 2.5* 2.5* 2.4* 2.5*   Recent Labs  Lab 10/30/23 1041  LIPASE 109*   No results for input(s): "AMMONIA" in the last 168 hours. Coagulation Profile: Recent Labs  Lab 10/31/23 0258 10/31/23 1028 11/03/23 0844  INR 1.3* 1.3* 1.5*   Cardiac Enzymes: No results for input(s): "CKTOTAL", "CKMB", "CKMBINDEX", "TROPONINI" in the last 168 hours. BNP (last 3 results) No results for input(s): "PROBNP" in the last 8760 hours. HbA1C: No results for input(s): "HGBA1C" in the last 72 hours. CBG: Recent Labs  Lab 11/04/23 2114 11/04/23 2215  GLUCAP 56* 170*   Lipid Profile: No results for input(s): "CHOL", "HDL", "LDLCALC", "TRIG", "CHOLHDL", "LDLDIRECT" in the last 72 hours. Thyroid Function Tests: No results for input(s): "TSH", "T4TOTAL", "FREET4", "T3FREE", "THYROIDAB" in the last 72 hours. Anemia Panel: No results for input(s): "VITAMINB12", "FOLATE", "FERRITIN", "TIBC", "IRON", "RETICCTPCT" in the last 72 hours. Sepsis Labs: No results for input(s): "PROCALCITON", "LATICACIDVEN" in the last 168 hours.  No results found for this or any previous visit (from the past 240 hours).       Radiology Studies: US BIOPSY (LIVER) Result Date: 11/03/2023 INDICATION: Liver metastases EXAM: ULTRASOUND CORE BIOPSY LEFT LIVER METASTASIS MEDICATIONS: 1% LIDOCAINE LOCAL ANESTHESIA/SEDATION: Moderate (conscious) sedation was employed during this procedure. A total of Versed 0.5 mg and Fentanyl 37.5 mcg was administered intravenously by the radiology nurse. Total intra-service moderate Sedation Time: 15 minutes. The patient's level of consciousness and vital signs were monitored continuously by radiology nursing throughout the procedure under my direct supervision. COMPLICATIONS: None immediate. PROCEDURE: Informed written consent was  obtained from the patient after a thorough discussion of the procedural risks, benefits and alternatives. All questions were addressed. Maximal Sterile Barrier Technique was utilized including caps, mask, sterile gowns, sterile gloves, sterile drape, hand hygiene and skin antiseptic. A timeout was performed prior to the initiation of the procedure. Previous imaging reviewed. Preliminary ultrasound performed. A left hepatic lesion was localized anteriorly and marked in the subxiphoid area. Under sterile conditions and local anesthesia, a 17 gauge guide needle was advanced into the lesion. Needle position confirmed with ultrasound. 18 gauge core biopsies obtained. Samples were intact and non fragmented. These were placed in formalin. Images obtained for documentation. Needle tract occluded with Gel-Foam. Postprocedure imaging demonstrates no hemorrhage or hematoma. Patient tolerated the biopsy well. IMPRESSION: Successful ultrasound left hepatic lesion 18 gauge core biopsy Electronically Signed   By: Judie Petit.  Shick M.D.   On: 11/03/2023 12:05        Scheduled Meds:  dronabinol  5 mg Oral BID AC   enoxaparin (LOVENOX) injection  30 mg Subcutaneous Q24H   feeding supplement  237 mL Oral BID BM   multivitamin with minerals  1 tablet Oral Daily   Continuous Infusions:          Glade Lloyd, MD  [facility] , 6:46 AM

## 2023-11-16 DEATH — deceased

## 2023-11-19 ENCOUNTER — Ambulatory Visit: Admitting: Family

## 2023-12-28 ENCOUNTER — Ambulatory Visit: Payer: 59 | Admitting: Internal Medicine
